# Patient Record
Sex: Female | Born: 1985 | Race: Black or African American | Hispanic: No | Marital: Single | Smoking: Current every day smoker
Health system: Southern US, Community
[De-identification: ages and names within clinical notes are randomized; demographics above are authoritative.]

## PROBLEM LIST (undated history)

## (undated) ENCOUNTER — Inpatient Hospital Stay (HOSPITAL_COMMUNITY): Payer: Self-pay

## (undated) DIAGNOSIS — A599 Trichomoniasis, unspecified: Secondary | ICD-10-CM

## (undated) DIAGNOSIS — R87629 Unspecified abnormal cytological findings in specimens from vagina: Secondary | ICD-10-CM

## (undated) DIAGNOSIS — A749 Chlamydial infection, unspecified: Secondary | ICD-10-CM

## (undated) DIAGNOSIS — N390 Urinary tract infection, site not specified: Secondary | ICD-10-CM

## (undated) DIAGNOSIS — O159 Eclampsia, unspecified as to time period: Secondary | ICD-10-CM

## (undated) DIAGNOSIS — A549 Gonococcal infection, unspecified: Secondary | ICD-10-CM

## (undated) DIAGNOSIS — IMO0002 Reserved for concepts with insufficient information to code with codable children: Secondary | ICD-10-CM

## (undated) DIAGNOSIS — R87619 Unspecified abnormal cytological findings in specimens from cervix uteri: Secondary | ICD-10-CM

## (undated) HISTORY — PX: COLPOSCOPY: SHX161

## (undated) HISTORY — PX: WISDOM TOOTH EXTRACTION: SHX21

---
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
## 2005-01-07 ENCOUNTER — Ambulatory Visit (HOSPITAL_COMMUNITY): Admission: RE | Admit: 2005-01-07 | Discharge: 2005-01-07 | Payer: Self-pay | Admitting: Obstetrics & Gynecology

## 2005-10-12 ENCOUNTER — Emergency Department (HOSPITAL_COMMUNITY): Admission: EM | Admit: 2005-10-12 | Discharge: 2005-10-12 | Payer: Self-pay | Admitting: *Deleted

## 2006-10-06 ENCOUNTER — Inpatient Hospital Stay (HOSPITAL_COMMUNITY): Admission: AD | Admit: 2006-10-06 | Discharge: 2006-10-06 | Payer: Self-pay | Admitting: Obstetrics

## 2006-10-07 ENCOUNTER — Inpatient Hospital Stay (HOSPITAL_COMMUNITY): Admission: AD | Admit: 2006-10-07 | Discharge: 2006-10-07 | Payer: Self-pay | Admitting: Obstetrics

## 2006-10-09 ENCOUNTER — Inpatient Hospital Stay (HOSPITAL_COMMUNITY): Admission: AD | Admit: 2006-10-09 | Discharge: 2006-10-10 | Payer: Self-pay | Admitting: Obstetrics

## 2006-10-09 ENCOUNTER — Encounter (INDEPENDENT_AMBULATORY_CARE_PROVIDER_SITE_OTHER): Payer: Self-pay | Admitting: Obstetrics

## 2007-06-24 ENCOUNTER — Emergency Department (HOSPITAL_COMMUNITY): Admission: EM | Admit: 2007-06-24 | Discharge: 2007-06-25 | Payer: Self-pay | Admitting: Emergency Medicine

## 2007-07-20 ENCOUNTER — Emergency Department (HOSPITAL_COMMUNITY): Admission: EM | Admit: 2007-07-20 | Discharge: 2007-07-21 | Payer: Self-pay | Admitting: Emergency Medicine

## 2007-07-21 ENCOUNTER — Emergency Department (HOSPITAL_COMMUNITY): Admission: EM | Admit: 2007-07-21 | Discharge: 2007-07-21 | Payer: Self-pay | Admitting: Emergency Medicine

## 2007-08-03 ENCOUNTER — Emergency Department (HOSPITAL_COMMUNITY): Admission: EM | Admit: 2007-08-03 | Discharge: 2007-08-04 | Payer: Self-pay | Admitting: Emergency Medicine

## 2008-09-29 ENCOUNTER — Emergency Department (HOSPITAL_COMMUNITY): Admission: EM | Admit: 2008-09-29 | Discharge: 2008-09-29 | Payer: Self-pay | Admitting: Emergency Medicine

## 2009-04-24 ENCOUNTER — Emergency Department (HOSPITAL_COMMUNITY): Admission: EM | Admit: 2009-04-24 | Discharge: 2009-04-24 | Payer: Self-pay | Admitting: Emergency Medicine

## 2009-11-10 ENCOUNTER — Emergency Department (HOSPITAL_COMMUNITY): Admission: EM | Admit: 2009-11-10 | Discharge: 2009-11-10 | Payer: Self-pay | Admitting: Emergency Medicine

## 2010-03-08 ENCOUNTER — Emergency Department (HOSPITAL_COMMUNITY)
Admission: EM | Admit: 2010-03-08 | Discharge: 2010-03-08 | Payer: Self-pay | Source: Home / Self Care | Admitting: Emergency Medicine

## 2010-04-03 ENCOUNTER — Encounter: Payer: Self-pay | Admitting: Obstetrics & Gynecology

## 2010-04-04 ENCOUNTER — Encounter: Payer: Self-pay | Admitting: Obstetrics & Gynecology

## 2010-05-24 LAB — URINALYSIS, ROUTINE W REFLEX MICROSCOPIC
Bilirubin Urine: NEGATIVE
Ketones, ur: NEGATIVE mg/dL
Nitrite: NEGATIVE
Protein, ur: NEGATIVE mg/dL
Urobilinogen, UA: 0.2 mg/dL (ref 0.0–1.0)

## 2010-05-24 LAB — WET PREP, GENITAL: Trich, Wet Prep: NONE SEEN

## 2010-05-24 LAB — POCT PREGNANCY, URINE: Preg Test, Ur: NEGATIVE

## 2010-05-24 LAB — GC/CHLAMYDIA PROBE AMP, GENITAL: GC Probe Amp, Genital: NEGATIVE

## 2010-05-27 LAB — URINALYSIS, ROUTINE W REFLEX MICROSCOPIC
Bilirubin Urine: NEGATIVE
Glucose, UA: NEGATIVE mg/dL
Hgb urine dipstick: NEGATIVE
Ketones, ur: NEGATIVE mg/dL
Nitrite: NEGATIVE
Protein, ur: NEGATIVE mg/dL
Specific Gravity, Urine: 1.023 (ref 1.005–1.030)
Urobilinogen, UA: 1 mg/dL (ref 0.0–1.0)
pH: 7.5 (ref 5.0–8.0)

## 2010-06-20 LAB — URINALYSIS, ROUTINE W REFLEX MICROSCOPIC
Bilirubin Urine: NEGATIVE
Hgb urine dipstick: NEGATIVE
Ketones, ur: NEGATIVE mg/dL
Protein, ur: NEGATIVE mg/dL
Specific Gravity, Urine: 1.024 (ref 1.005–1.030)
pH: 6 (ref 5.0–8.0)

## 2010-06-20 LAB — RPR: RPR Ser Ql: NONREACTIVE

## 2010-06-20 LAB — URINE MICROSCOPIC-ADD ON

## 2010-06-20 LAB — CBC
Hemoglobin: 12.5 g/dL (ref 12.0–15.0)
MCHC: 33 g/dL (ref 30.0–36.0)
MCV: 95.3 fL (ref 78.0–100.0)
RBC: 3.98 MIL/uL (ref 3.87–5.11)

## 2010-06-20 LAB — WET PREP, GENITAL

## 2010-06-20 LAB — GC/CHLAMYDIA PROBE AMP, GENITAL: Chlamydia, DNA Probe: POSITIVE — AB

## 2010-06-22 ENCOUNTER — Emergency Department (HOSPITAL_COMMUNITY)
Admission: EM | Admit: 2010-06-22 | Discharge: 2010-06-22 | Disposition: A | Discharge status: Home / Self Care | Payer: No Typology Code available for payment source | Attending: Emergency Medicine | Admitting: Emergency Medicine

## 2010-06-22 ENCOUNTER — Emergency Department (HOSPITAL_COMMUNITY): Payer: No Typology Code available for payment source

## 2010-06-22 DIAGNOSIS — R51 Headache: Secondary | ICD-10-CM | POA: Insufficient documentation

## 2010-06-22 DIAGNOSIS — M542 Cervicalgia: Secondary | ICD-10-CM | POA: Insufficient documentation

## 2010-07-30 NOTE — Discharge Summary (Signed)
Alicia Castro, Alicia Castro          ACCOUNT NO.:  [id number]   MEDICAL RECORD NO.:  [id number]          PATIENT TYPE:  INP   LOCATION:  9312                          FACILITY:  [facility]   PHYSICIAN:  Kathreen Cosier, M.D.DATE OF BIRTH:  [birth year]   DATE OF ADMISSION:  10/09/2006  DATE OF DISCHARGE:  10/10/2006                               DISCHARGE SUMMARY   The patient into 25 year old gravida 4, para 1-0-2-1. Her EDC by  ultrasound was December 09, 2006 and by last menstrual period December 08, 2006. The patient was admitted on October 09, 2006 with a fetal demise.  She states that the Friday and Saturday prior to her at admission she  came to the hospital with premature contractions.  No history of leakage  of fluid. She was sent home both times after hydration with no  medication.  The patient stated that the fetal movement was normal until  the night prior to admission. She was admitted on July 28, 11:25 a.m.  There was no fetal heart, cervix was 1 cm, 90% and the vertex was at -1  station.   HOSPITAL COURSE:  Membranes were ruptured artificially after ultrasound  confirmed fetal demise at 30 weeks. She progressed in labor on her own  and had a normal vaginal delivery of a nonviable female. Placenta was  sent to pathology.  Initially the patient did not want an autopsy but  subsequently agreed to one.   LABORATORY DATA:  Her prenatal labs were normal.  Her hemoglobin on her  first visit was 12.5, platelets 194.  She was O+, Rh negative, sickle  cell negative, RPR nonreactive, rubella positive, hepatitis negative,  HIV negative, PPD negative. GC and Chlamydia negative.  Diabetic screen;  glucose was 73. Pap Smear negative. On admission to the hospital  hemoglobin 10.4, white count 8.4, postdelivery 10.5 and 10.9.  PT/PTT  normal.  RPR negative.  She was discharged home on the first postpartum  day.   DISCHARGE DIAGNOSIS:  Intrauterine pregnancy at 30 weeks with fetal   demise.           ______________________________  Kathreen Cosier, M.D.     BAM/MEDQ  D:  11/01/2006  T:  11/01/2006  Job:  [id number]

## 2010-12-07 LAB — URINE MICROSCOPIC-ADD ON

## 2010-12-07 LAB — URINALYSIS, ROUTINE W REFLEX MICROSCOPIC
Ketones, ur: NEGATIVE
Specific Gravity, Urine: 1.018
Urobilinogen, UA: 0.2
pH: 6

## 2010-12-08 LAB — DIFFERENTIAL
Eosinophils Absolute: 0.1
Eosinophils Relative: 3
Lymphocytes Relative: 48 — ABNORMAL HIGH
Lymphs Abs: 1.7
Monocytes Absolute: 0.6
Monocytes Relative: 16 — ABNORMAL HIGH

## 2010-12-08 LAB — CBC
MCHC: 33.6
MCV: 92.3
Platelets: 173
RBC: 4.47
WBC: 3.7 — ABNORMAL LOW

## 2010-12-08 LAB — URINALYSIS, ROUTINE W REFLEX MICROSCOPIC
Hgb urine dipstick: NEGATIVE
Nitrite: NEGATIVE
Protein, ur: NEGATIVE
Specific Gravity, Urine: 1.033 — ABNORMAL HIGH
Urobilinogen, UA: 4 — ABNORMAL HIGH

## 2010-12-08 LAB — COMPREHENSIVE METABOLIC PANEL
ALT: 18
AST: 17
Albumin: 3.9
CO2: 26
Calcium: 9.3
Creatinine, Ser: 0.87
GFR calc Af Amer: >60
GFR calc non Af Amer: >60
Sodium: 139

## 2010-12-08 LAB — PREGNANCY, URINE: Preg Test, Ur: NEGATIVE

## 2010-12-27 LAB — URINALYSIS, ROUTINE W REFLEX MICROSCOPIC
Glucose, UA: NEGATIVE
Hgb urine dipstick: NEGATIVE
Ketones, ur: NEGATIVE
Nitrite: NEGATIVE
Specific Gravity, Urine: 1.025
pH: 6

## 2010-12-27 LAB — URINE MICROSCOPIC-ADD ON

## 2010-12-27 LAB — APTT: aPTT: 35

## 2010-12-27 LAB — CBC
HCT: 30.8 — ABNORMAL LOW
MCHC: 33.9
MCHC: 34
MCV: 95.4
MCV: 95.4
Platelets: 184
RBC: 3.21 — ABNORMAL LOW
RBC: 3.23 — ABNORMAL LOW
RDW: 13.6
WBC: 10.9 — ABNORMAL HIGH

## 2010-12-27 LAB — WET PREP, GENITAL
Clue Cells Wet Prep HPF POC: NONE SEEN
Trich, Wet Prep: NONE SEEN

## 2011-03-09 ENCOUNTER — Encounter: Payer: Self-pay | Admitting: *Deleted

## 2011-03-09 ENCOUNTER — Emergency Department (HOSPITAL_COMMUNITY)
Admission: EM | Admit: 2011-03-09 | Discharge: 2011-03-09 | Disposition: A | Discharge status: Home / Self Care | Payer: Self-pay | Attending: Emergency Medicine | Admitting: Emergency Medicine

## 2011-03-09 DIAGNOSIS — F17200 Nicotine dependence, unspecified, uncomplicated: Secondary | ICD-10-CM | POA: Insufficient documentation

## 2011-03-09 DIAGNOSIS — M549 Dorsalgia, unspecified: Secondary | ICD-10-CM | POA: Insufficient documentation

## 2011-03-09 DIAGNOSIS — N390 Urinary tract infection, site not specified: Secondary | ICD-10-CM | POA: Insufficient documentation

## 2011-03-09 DIAGNOSIS — R10819 Abdominal tenderness, unspecified site: Secondary | ICD-10-CM | POA: Insufficient documentation

## 2011-03-09 DIAGNOSIS — R300 Dysuria: Secondary | ICD-10-CM | POA: Insufficient documentation

## 2011-03-09 LAB — URINALYSIS, ROUTINE W REFLEX MICROSCOPIC
Bilirubin Urine: NEGATIVE
Glucose, UA: NEGATIVE mg/dL
Protein, ur: NEGATIVE mg/dL
pH: 6 (ref 5.0–8.0)

## 2011-03-09 LAB — URINE MICROSCOPIC-ADD ON

## 2011-03-09 MED ORDER — PHENAZOPYRIDINE HCL 95 MG PO TABS: 95.0000 mg | ORAL_TABLET | Freq: Three times a day (TID) | ORAL | Status: AC | PRN

## 2011-03-09 MED ORDER — CIPROFLOXACIN HCL 500 MG PO TABS: 500.0000 mg | ORAL_TABLET | Freq: Two times a day (BID) | ORAL | Status: AC

## 2011-03-09 MED ORDER — HYDROCODONE-ACETAMINOPHEN 5-500 MG PO TABS: 1.0000 | ORAL_TABLET | Freq: Four times a day (QID) | ORAL | Status: DC | PRN

## 2011-03-09 MED ORDER — HYDROCODONE-ACETAMINOPHEN 5-500 MG PO TABS: 1.0000 | ORAL_TABLET | Freq: Four times a day (QID) | ORAL | Status: AC | PRN

## 2011-03-09 NOTE — ED Provider Notes (Signed)
History     CSN: [id number]  Arrival date & time 03/09/11  0721   First MD Initiated Contact with Patient 03/09/11 0747      Chief Complaint  Patient presents with  . Dysuria  . Back Pain    (Consider location/radiation/quality/duration/timing/severity/associated sxs/prior treatment) Patient is a 25 y.o. female presenting with dysuria and back pain. The history is provided by the patient.  Dysuria   Back Pain  Associated symptoms include dysuria.    Pt presents to the ED with complaints of lower back pain and pain during urination for 24 hours. Pt is also started her cycle yesterday. Pt denies fevers, nausea, vomiting, diarrhea, chills, abdominal pain. She does not have a significant history for UTIs and is not pregnant currently.  History reviewed. No pertinent past medical history.  History reviewed. No pertinent past surgical history.  No family history on file.  History  Substance Use Topics  . Smoking status: Current Everyday Smoker -- 0.5 packs/day  . Smokeless tobacco: Not on file  . Alcohol Use: Yes     ocassionally    OB History    Grav Para Term Preterm Abortions TAB SAB Ect Mult Living                  Review of Systems  Genitourinary: Positive for dysuria.  Musculoskeletal: Positive for back pain.  All other systems reviewed and are negative.    Allergies  Review of patient's allergies indicates no known allergies.  Home Medications   Current Outpatient Rx  Name Route Sig Dispense Refill  . CIPROFLOXACIN HCL 500 MG PO TABS Oral Take 1 tablet (500 mg total) by mouth 2 (two) times daily. 28 tablet 0  . HYDROCODONE-ACETAMINOPHEN 5-500 MG PO TABS Oral Take 1 tablet by mouth every 6 (six) hours as needed for pain. 30 tablet 0  . PHENAZOPYRIDINE HCL 95 MG PO TABS Oral Take 1 tablet (95 mg total) by mouth 3 (three) times daily as needed for pain. 10 tablet 0    BP 118/63  Pulse 77  Temp(Src) 97.6 F (36.4 C) (Oral)  Resp 16  Wt 220 lb (99.791  kg)  SpO2 100%  LMP 01/13/2011  Physical Exam  Nursing note and vitals reviewed. Constitutional: She appears well-developed and well-nourished.  HENT:  Head: Normocephalic and atraumatic.  Eyes: Conjunctivae are normal. Pupils are equal, round, and reactive to light.  Neck: Trachea normal, normal range of motion and full passive range of motion without pain. Neck supple.  Cardiovascular: Normal rate, regular rhythm and normal pulses.   Pulmonary/Chest: Effort normal. Chest wall is not dull to percussion. She exhibits no tenderness, no crepitus, no edema, no deformity and no retraction.  Abdominal: Soft. Normal appearance and bowel sounds are normal. She exhibits no distension and no mass. There is Tenderness: mild suprapubic tenderness.. There is no rebound and no guarding.  Musculoskeletal: Normal range of motion.  Neurological: She is alert. She has normal strength.  Skin: Skin is warm, dry and intact.  Psychiatric: Her speech is normal. Cognition and memory are normal.    ED Course  Procedures (including critical care time)  Labs Reviewed  URINALYSIS, ROUTINE W REFLEX MICROSCOPIC - Abnormal; Notable for the following:    Color, Urine AMBER (*) BIOCHEMICALS MAY BE AFFECTED BY COLOR   APPearance CLOUDY (*)    Hgb urine dipstick LARGE (*)    Ketones, ur TRACE (*)    Leukocytes, UA SMALL (*)    All other components  within normal limits  URINE MICROSCOPIC-ADD ON - Abnormal; Notable for the following:    Squamous Epithelial / LPF FEW (*)    Bacteria, UA MANY (*)    All other components within normal limits  PREGNANCY, URINE   No results found.   1. UTI (urinary tract infection)       MDM  Discussed treatment plan with patient and is is understanding. Will follow-up with PCP.       Dorthula Matas, PA 03/09/11 [phone]

## 2011-03-09 NOTE — ED Notes (Signed)
Pt now stating "I am having my period, it's like a regular cycle"

## 2011-03-09 NOTE — ED Notes (Signed)
Pt states "Christmas morning, my lower back started hurting, I thought I might be getting a yeast infection, but then I noticed blood and thought it was my period, my back started hurting worse, too much pressure"

## 2011-03-10 NOTE — ED Provider Notes (Signed)
Medical screening examination/treatment/procedure(s) were performed by non-physician practitioner and as supervising physician I was immediately available for consultation/collaboration.   Suzi Roots, MD 03/10/11 [phone]

## 2011-06-05 ENCOUNTER — Emergency Department (HOSPITAL_COMMUNITY): Payer: No Typology Code available for payment source

## 2011-06-05 ENCOUNTER — Encounter (HOSPITAL_COMMUNITY): Payer: Self-pay | Admitting: *Deleted

## 2011-06-05 ENCOUNTER — Emergency Department (HOSPITAL_COMMUNITY)
Admission: EM | Admit: 2011-06-05 | Discharge: 2011-06-05 | Disposition: A | Discharge status: Home / Self Care | Payer: No Typology Code available for payment source | Attending: Emergency Medicine | Admitting: Emergency Medicine

## 2011-06-05 DIAGNOSIS — Y92410 Unspecified street and highway as the place of occurrence of the external cause: Secondary | ICD-10-CM | POA: Insufficient documentation

## 2011-06-05 DIAGNOSIS — M545 Low back pain, unspecified: Secondary | ICD-10-CM | POA: Insufficient documentation

## 2011-06-05 DIAGNOSIS — S20219A Contusion of unspecified front wall of thorax, initial encounter: Secondary | ICD-10-CM | POA: Insufficient documentation

## 2011-06-05 DIAGNOSIS — F17200 Nicotine dependence, unspecified, uncomplicated: Secondary | ICD-10-CM | POA: Insufficient documentation

## 2011-06-05 MED ORDER — IBUPROFEN 800 MG PO TABS
800.0000 mg | ORAL_TABLET | Freq: Once | ORAL | Status: AC
  Administered 2011-06-05: 800 mg via ORAL
  Filled 2011-06-05: qty 1

## 2011-06-05 MED ORDER — HYDROCODONE-ACETAMINOPHEN 5-500 MG PO TABS: 1.0000 | ORAL_TABLET | Freq: Four times a day (QID) | ORAL | Status: AC | PRN

## 2011-06-05 MED ORDER — IBUPROFEN 600 MG PO TABS: 600.0000 mg | ORAL_TABLET | Freq: Four times a day (QID) | ORAL | Status: AC | PRN
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
## 2011-06-05 NOTE — ED Provider Notes (Signed)
Medical screening examination/treatment/procedure(s) were performed by non-physician practitioner and as supervising physician I was immediately available for consultation/collaboration.   Charlesia Canaday, MD 06/05/11 0649 

## 2011-07-04 ENCOUNTER — Emergency Department (HOSPITAL_COMMUNITY)
Admission: EM | Admit: 2011-07-04 | Discharge: 2011-07-05 | Disposition: A | Discharge status: Home / Self Care | Payer: Medicaid Other | Attending: Emergency Medicine | Admitting: Emergency Medicine

## 2011-07-04 ENCOUNTER — Encounter (HOSPITAL_COMMUNITY): Payer: Self-pay

## 2011-07-04 DIAGNOSIS — L03211 Cellulitis of face: Secondary | ICD-10-CM | POA: Insufficient documentation

## 2011-07-04 DIAGNOSIS — L0201 Cutaneous abscess of face: Secondary | ICD-10-CM | POA: Insufficient documentation

## 2011-07-04 DIAGNOSIS — L988 Other specified disorders of the skin and subcutaneous tissue: Secondary | ICD-10-CM | POA: Insufficient documentation

## 2011-07-04 DIAGNOSIS — N390 Urinary tract infection, site not specified: Secondary | ICD-10-CM | POA: Insufficient documentation

## 2011-07-04 DIAGNOSIS — F17200 Nicotine dependence, unspecified, uncomplicated: Secondary | ICD-10-CM | POA: Insufficient documentation

## 2011-07-04 LAB — URINALYSIS, ROUTINE W REFLEX MICROSCOPIC
Ketones, ur: NEGATIVE mg/dL
Nitrite: POSITIVE — AB
Protein, ur: 30 mg/dL — AB
Urobilinogen, UA: 1 mg/dL (ref 0.0–1.0)

## 2011-07-04 LAB — URINE MICROSCOPIC-ADD ON

## 2011-07-04 MED ORDER — SULFAMETHOXAZOLE-TRIMETHOPRIM 800-160 MG PO TABS: 1.0000 | ORAL_TABLET | Freq: Two times a day (BID) | ORAL | Status: AC

## 2011-07-04 MED ORDER — CEFTRIAXONE SODIUM 1 G IJ SOLR
1.0000 g | INTRAMUSCULAR | Status: DC
  Administered 2011-07-05: 1 g via INTRAMUSCULAR
  Filled 2011-07-04: qty 10

## 2011-07-04 NOTE — Discharge Instructions (Signed)
You were found to have a urinary tract infection today. Even given a prescription for an antibiotic to treat this infection as well as the infection is on your skin. Please take this as instructed for the full length of time. It is recommended that you have a recheck of your symptoms in the next 24-48 hours to be sure your skin infection is improving. Use warm cloth compresses over the areas 4-5 times a day for 20-30 minutes to help increase blood flow. Return sooner to the emergency room if you develop any increased swelling, fever, chills, sweats or persistent nausea vomiting.   Abscess An abscess (boil or furuncle) is an infected area that contains a collection of pus.  SYMPTOMS Signs and symptoms of an abscess include pain, tenderness, redness, or hardness. You may feel a moveable soft area under your skin. An abscess can occur anywhere in the body.  TREATMENT  A surgical cut (incision) may be made over your abscess to drain the pus. Gauze may be packed into the space or a drain may be looped through the abscess cavity (pocket). This provides a drain that will allow the cavity to heal from the inside outwards. The abscess may be painful for a few days, but should feel much better if it was drained.  Your abscess, if seen early, may not have localized and may not have been drained. If not, another appointment may be required if it does not get better on its own or with medications. HOME CARE INSTRUCTIONS   Only take over-the-counter or prescription medicines for pain, discomfort, or fever as directed by your caregiver.   Take your antibiotics as directed if they were prescribed. Finish them even if you start to feel better.   Keep the skin and clothes clean around your abscess.   If the abscess was drained, you will need to use gauze dressing to collect any draining pus. Dressings will typically need to be changed 3 or more times a day.   The infection may spread by skin contact with others.  Avoid skin contact as much as possible.   Practice good hygiene. This includes regular hand washing, cover any draining skin lesions, and do not share personal care items.   If you participate in sports, do not share athletic equipment, towels, whirlpools, or personal care items. Shower after every practice or tournament.   If a draining area cannot be adequately covered:   Do not participate in sports.   Children should not participate in day care until the wound has healed or drainage stops.   If your caregiver has given you a follow-up appointment, it is very important to keep that appointment. Not keeping the appointment could result in a much worse infection, chronic or permanent injury, pain, and disability. If there is any problem keeping the appointment, you must call back to this facility for assistance.  SEEK MEDICAL CARE IF:   You develop increased pain, swelling, redness, drainage, or bleeding in the wound site.   You develop signs of generalized infection including muscle aches, chills, fever, or a general ill feeling.   You have an oral temperature above 102 F (38.9 C).  MAKE SURE YOU:   Understand these instructions.   Will watch your condition.   Will get help right away if you are not doing well or get worse.  Document Released: 12/08/2004 Document Revised: 02/17/2011 Document Reviewed: 10/02/2007 [facility] Patient Information 2012 [place], [place].     Urinary Tract Infection Infections of the  urinary tract can start in several places. A bladder infection (cystitis), a kidney infection (pyelonephritis), and a prostate infection (prostatitis) are different types of urinary tract infections (UTIs). They usually get better if treated with medicines (antibiotics) that kill germs. Take all the medicine until it is gone. You or your child may feel better in a few days, but TAKE ALL MEDICINE or the infection may not respond and may become more difficult to treat. HOME  CARE INSTRUCTIONS   Drink enough water and fluids to keep the urine clear or pale yellow. Cranberry juice is especially recommended, in addition to large amounts of water.   Avoid caffeine, tea, and carbonated beverages. They tend to irritate the bladder.   Alcohol may irritate the prostate.   Only take over-the-counter or prescription medicines for pain, discomfort, or fever as directed by your caregiver.  To prevent further infections:  Empty the bladder often. Avoid holding urine for long periods of time.   After a bowel movement, women should cleanse from front to back. Use each tissue only once.   Empty the bladder before and after sexual intercourse.  FINDING OUT THE RESULTS OF YOUR TEST Not all test results are available during your visit. If your or your child's test results are not back during the visit, make an appointment with your caregiver to find out the results. Do not assume everything is normal if you have not heard from your caregiver or the medical facility. It is important for you to follow up on all test results. SEEK MEDICAL CARE IF:   There is back pain.   Your baby is older than 3 months with a rectal temperature of 100.5 F (38.1 C) or higher for more than 1 day.   Your or your child's problems (symptoms) are no better in 3 days. Return sooner if you or your child is getting worse.  SEEK IMMEDIATE MEDICAL CARE IF:   There is severe back pain or lower abdominal pain.   You or your child develops chills.   You have a fever.   Your baby is older than 3 months with a rectal temperature of 102 F (38.9 C) or higher.   Your baby is 53 months old or younger with a rectal temperature of 100.4 F (38 C) or higher.   There is nausea or vomiting.   There is continued burning or discomfort with urination.  MAKE SURE YOU:   Understand these instructions.   Will watch your condition.   Will get help right away if you are not doing well or get worse.    Document Released: 12/08/2004 Document Revised: 02/17/2011 Document Reviewed: 07/13/2006 [facility] Patient Information 2012 [place], [place].    RESOURCE GUIDE  Dental Problems  Patients with Medicaid: [facility] [phone] [address]                                           [phone] [address] Phone:  [phone]                                                  Phone:  [phone]  If unable to pay or uninsured, contact:  [facility] or [facility]. to become qualified for the adult dental clinic.  Chronic Pain Problems Contact [facility]  [phone] Patients need to be referred by their primary care doctor.  Insufficient Money for Medicine Contact [employer]:  call "211" or [facility] 657-756-3862.  No Primary Care Doctor Call [facility]  [phone] Other agencies that provide inexpensive medical care    [facility]  [phone]    [facility]  [phone]    [facility]  [phone]    [facility]  [phone]    [facility]  [phone]    [facility]  [phone]  Psychological Services [facility] Behavior[facility]  [phone] [facility] Services  [phone] [facility] Mental Health   [phone] (emergency services [phone])  Substance Abuse Resources Alcohol and Drug Services  [phone] [facility] 915-231-3773 [facility] [phone] Floydene Flock 6848505868 Residential & Outpatient Substance Abuse Program  [phone]  Abuse/Neglect [facility] Child Abuse Hotline 8673338588 [facility] Child Abuse Hotline [phone] (After Hours)  Emergency Shelter [facility] [phone]  Maternity Homes [facility] 5393005135 [facility] [phone]  MRSA Hotline #:   6803393464    [facility] Resources  [facility]  [place]     [employer]                          [facility] Dept. [address] [place]                       [address]      [address]  Blondell Reveal Phone:  [phone]                                   Phone:  [phone]                 Phone:  [phone]  [facility] Mental [facility] Phone:  [phone]  [facility] Child Abuse Hotline 706-761-0967 [phone] (After Hours)

## 2011-07-04 NOTE — ED Provider Notes (Signed)
History     CSN: [id number]  Arrival date & time 07/04/11  1941   First MD Initiated Contact with Patient 07/04/11 2211      Chief Complaint  Patient presents with  . Recurrent Skin Infections  . Urinary Frequency    HPI  History provided by the patient. Patient is a 26 year old female with no significant past medical history who presents with complaints of soreness and possible skin infection to right cheek and face. Patient states that she felt that she had a small pimple to the right cheek over the past several days. There is a small amount of pointing and patient tried to pop the pimple with expression of slight pus and blood. Since that time she has continued to have some soreness and firmness to the skin. Patient also reports developing 2 separate similar red areas to her right lower leg. She denies any bleeding or drainage from these areas. Patient has not taken anything or done anything to help with her symptoms. Tenderness is worse with palpation. Patient denies any dental pain. Patient denies any fever, chills, sweats. Patient denies any swelling around either eye pain. Symptoms are described as moderate. Patient also has secondary concerns for possible UTI. She reports several days of urinary frequency. She denies dysuria or hematuria. Patient denies any vaginal bleeding or vaginal discharge. Patient denies any abdominal pain, flank pain or nausea vomiting.      History reviewed. No pertinent past medical history.  History reviewed. No pertinent past surgical history.  History reviewed. No pertinent family history.  History  Substance Use Topics  . Smoking status: Current Everyday Smoker -- 0.5 packs/day  . Smokeless tobacco: Not on file  . Alcohol Use: Yes     ocassionally    OB History    Grav Para Term Preterm Abortions TAB SAB Ect Mult Living                  Review of Systems  Constitutional: Negative for fever and chills.  Eyes: Negative for pain and  visual disturbance.  Gastrointestinal: Negative for abdominal pain.  Genitourinary: Positive for frequency. Negative for dysuria, hematuria, flank pain, vaginal bleeding and vaginal discharge.  Musculoskeletal: Negative for myalgias and back pain.  Skin: Positive for rash.    Allergies  Review of patient's allergies indicates no known allergies.  Home Medications   Current Outpatient Rx  Name Route Sig Dispense Refill  . IBUPROFEN 200 MG PO TABS Oral Take 200 mg by mouth every 6 (six) hours as needed.      BP 116/72  Pulse 80  Temp(Src) 98.7 F (37.1 C) (Oral)  Resp 18  Wt 200 lb (90.719 kg)  SpO2 100%  LMP 06/15/2011  Physical Exam  Nursing note and vitals reviewed. Constitutional: She is oriented to person, place, and time. She appears well-developed and well-nourished. No distress.  HENT:  Head: Normocephalic and atraumatic.       8 mm erythematous macule right cheek with induration. Small pinpoint scab without drainage or bleeding. Very minimal swelling extending slightly to inferior periorbital area.  Cardiovascular: Normal rate and regular rhythm.   Pulmonary/Chest: Effort normal and breath sounds normal. No respiratory distress. She has no wheezes. She has no rales.  Abdominal: Soft. She exhibits no distension. There is no tenderness. There is no rebound, no guarding and no CVA tenderness.  Neurological: She is alert and oriented to person, place, and time.  Skin: Skin is warm and dry.  2 independent 1-2 cm erythematous macules with slight induration on right anterior medial lower leg and right distal medial thigh. No erythematous streaks. No pointing or fluctuance.  Psychiatric: She has a normal mood and affect. Her behavior is normal.    ED Course  Procedures    Results for orders placed during the hospital encounter of 07/04/11  URINALYSIS, ROUTINE W REFLEX MICROSCOPIC      Component Value Range   Color, Urine YELLOW  YELLOW    APPearance CLOUDY (*)  CLEAR    Specific Gravity, Urine 1.026  1.005 - 1.030    pH 7.0  5.0 - 8.0    Glucose, UA NEGATIVE  NEGATIVE (mg/dL)   Hgb urine dipstick TRACE (*) NEGATIVE    Bilirubin Urine NEGATIVE  NEGATIVE    Ketones, ur NEGATIVE  NEGATIVE (mg/dL)   Protein, ur 30 (*) NEGATIVE (mg/dL)   Urobilinogen, UA 1.0  0.0 - 1.0 (mg/dL)   Nitrite POSITIVE (*) NEGATIVE    Leukocytes, UA LARGE (*) NEGATIVE   POCT PREGNANCY, URINE      Component Value Range   Preg Test, Ur NEGATIVE  NEGATIVE   URINE MICROSCOPIC-ADD ON      Component Value Range   Squamous Epithelial / LPF RARE  RARE    WBC, UA TOO NUMEROUS TO COUNT  <3 (WBC/hpf)   RBC / HPF 0-2  <3 (RBC/hpf)   Bacteria, UA MANY (*) RARE    Urine-Other MUCOUS PRESENT         1. Abscess of face   2. UTI (lower urinary tract infection)       MDM  10:10 PM patient seen and evaluated. Patient in no acute distress.        Angus Seller, [place] 07/05/11 [phone]

## 2011-07-04 NOTE — ED Notes (Signed)
Attempted to assess patient but she was on her cellphone, continued to talk while i was present. Will reattempt assessment when pt is done with phone conversation

## 2011-07-04 NOTE — ED Notes (Signed)
Pt complains of an abcess on her face and two on her leg, she also complains of urinary frequency and wants to be tested for a UTI

## 2011-07-05 MED ORDER — LIDOCAINE HCL 1 % IJ SOLN
INTRAMUSCULAR | Status: AC
  Administered 2011-07-05: 3 mL
  Filled 2011-07-05: qty 20

## 2011-07-05 NOTE — ED Notes (Signed)
Pt DC'd by me per Charge Nurse orders

## 2011-07-05 NOTE — ED Provider Notes (Signed)
Medical screening examination/treatment/procedure(s) were performed by non-physician practitioner and as supervising physician I was immediately available for consultation/collaboration.  Hurman Horn, MD 07/05/11 [phone]

## 2011-08-21 ENCOUNTER — Encounter (HOSPITAL_COMMUNITY): Payer: Self-pay | Admitting: *Deleted

## 2011-08-21 ENCOUNTER — Emergency Department (HOSPITAL_COMMUNITY)
Admission: EM | Admit: 2011-08-21 | Discharge: 2011-08-21 | Disposition: A | Discharge status: Home / Self Care | Payer: Medicaid Other | Attending: Emergency Medicine | Admitting: Emergency Medicine

## 2011-08-21 DIAGNOSIS — R197 Diarrhea, unspecified: Secondary | ICD-10-CM | POA: Insufficient documentation

## 2011-08-21 DIAGNOSIS — R1110 Vomiting, unspecified: Secondary | ICD-10-CM | POA: Insufficient documentation

## 2011-08-21 DIAGNOSIS — N390 Urinary tract infection, site not specified: Secondary | ICD-10-CM

## 2011-08-21 DIAGNOSIS — R109 Unspecified abdominal pain: Secondary | ICD-10-CM | POA: Insufficient documentation

## 2011-08-21 LAB — URINALYSIS, ROUTINE W REFLEX MICROSCOPIC
Glucose, UA: NEGATIVE mg/dL
Protein, ur: 30 mg/dL — AB
Specific Gravity, Urine: 1.033 — ABNORMAL HIGH (ref 1.005–1.030)
pH: 6 (ref 5.0–8.0)

## 2011-08-21 LAB — URINE MICROSCOPIC-ADD ON

## 2011-08-21 LAB — POCT PREGNANCY, URINE: Preg Test, Ur: NEGATIVE

## 2011-08-21 MED ORDER — SODIUM CHLORIDE 0.9 % IV BOLUS (SEPSIS)
1000.0000 mL | Freq: Once | INTRAVENOUS | Status: AC
  Administered 2011-08-21: 1000 mL via INTRAVENOUS

## 2011-08-21 MED ORDER — HYDROCODONE-ACETAMINOPHEN 5-325 MG PO TABS: 1.0000 | ORAL_TABLET | ORAL | Status: AC | PRN

## 2011-08-21 MED ORDER — CIPROFLOXACIN HCL 500 MG PO TABS: 500.0000 mg | ORAL_TABLET | Freq: Two times a day (BID) | ORAL | Status: AC

## 2011-08-21 MED ORDER — MORPHINE SULFATE 4 MG/ML IJ SOLN
4.0000 mg | Freq: Once | INTRAMUSCULAR | Status: AC
  Administered 2011-08-21: 4 mg via INTRAVENOUS
  Filled 2011-08-21: qty 1

## 2011-08-21 MED ORDER — PROMETHAZINE HCL 25 MG RE SUPP: 25.0000 mg | Freq: Four times a day (QID) | RECTAL | Status: DC | PRN

## 2011-08-21 MED ORDER — DEXTROSE 5 % IV SOLN
1.0000 g | Freq: Once | INTRAVENOUS | Status: AC
  Administered 2011-08-21: 1 g via INTRAVENOUS
  Filled 2011-08-21: qty 10

## 2011-08-21 MED ORDER — PROMETHAZINE HCL 25 MG PO TABS: 25.0000 mg | ORAL_TABLET | Freq: Four times a day (QID) | ORAL | Status: DC | PRN

## 2011-08-21 MED ORDER — ONDANSETRON HCL 4 MG/2ML IJ SOLN
4.0000 mg | Freq: Once | INTRAMUSCULAR | Status: AC
  Administered 2011-08-21: 4 mg via INTRAVENOUS
  Filled 2011-08-21: qty 2

## 2011-08-21 NOTE — ED Provider Notes (Signed)
History     CSN: [id number]  Arrival date & time 08/21/11  4098   First MD Initiated Contact with Patient 08/21/11 [phone]      Chief Complaint  Patient presents with  . Emesis  . Abdominal Pain    (Consider location/radiation/quality/duration/timing/severity/associated sxs/prior treatment) HPI History from patient. 26 year old female who presents with abdominal and low back pain which began last evening around 5 PM. Pain is described as cramping in nature to the abdomen. Pain to the back is sharp in nature. Pain is constant and there are no known aggravating/alleviating factors. Soon after the pain started, she began to have vomiting and diarrhea. She denies dysuria, hematuria, but has noted some increased frequency of urination. She denies vaginal bleeding or discharge. Denies fever or chills. Patient does have a history of pyelonephritis and states that this feels somewhat similar, although not quite as severe. No treatment at home prior to arrival.  History reviewed. No pertinent past medical history.  History reviewed. No pertinent past surgical history.  No family history on file.  History  Substance Use Topics  . Smoking status: Current Everyday Smoker -- 0.5 packs/day  . Smokeless tobacco: Not on file  . Alcohol Use: Yes     ocassionally    OB History    Grav Para Term Preterm Abortions TAB SAB Ect Mult Living                  Review of Systems  Constitutional: Negative for fever, chills and appetite change.  Respiratory: Negative for cough and shortness of breath.   Cardiovascular: Negative for chest pain.  Gastrointestinal: Positive for nausea, vomiting, abdominal pain and diarrhea. Negative for constipation and rectal pain.  Genitourinary: Positive for frequency and flank pain. Negative for dysuria, vaginal bleeding, vaginal discharge, difficulty urinating and pelvic pain.  Musculoskeletal: Negative for myalgias.  Skin: Negative for rash.  Neurological: Negative  for dizziness and headaches.    Allergies  Review of patient's allergies indicates no known allergies.  Home Medications   Current Outpatient Rx  Name Route Sig Dispense Refill  . BC HEADACHE POWDER PO Oral Take 1 each by mouth daily as needed. For headache    . MIDOL PO Oral Take 1-2 tablets by mouth every 8 (eight) hours as needed. For pain      BP 100/68  Pulse 70  Temp(Src) 98.2 F (36.8 C) (Oral)  Resp 16  Ht 5\' 5"  (1.651 m)  Wt 220 lb (99.791 kg)  BMI 36.61 kg/m2  SpO2 100%  Physical Exam  Nursing note and vitals reviewed. Constitutional: She appears well-developed and well-nourished. No distress.  HENT:  Head: Normocephalic and atraumatic.  Right Ear: External ear normal.  Left Ear: External ear normal.  Mouth/Throat: Oropharynx is clear and moist.  Neck: Normal range of motion.  Cardiovascular: Normal rate, regular rhythm and normal heart sounds.   Pulmonary/Chest: Effort normal and breath sounds normal. She exhibits no tenderness.  Abdominal: Soft. Bowel sounds are normal. There is no tenderness. There is no rebound and no guarding.       +CVA tenderness L  Musculoskeletal: Normal range of motion.  Neurological: She is alert.  Skin: Skin is warm and dry. She is not diaphoretic.  Psychiatric: She has a normal mood and affect.    ED Course  Procedures (including critical care time)  Labs Reviewed  URINALYSIS, ROUTINE W REFLEX MICROSCOPIC - Abnormal; Notable for the following:    Color, Urine AMBER (*) BIOCHEMICALS MAY BE  AFFECTED BY COLOR   APPearance TURBID (*)    Specific Gravity, Urine 1.033 (*)    Bilirubin Urine SMALL (*)    Ketones, ur 15 (*)    Protein, ur 30 (*)    Nitrite POSITIVE (*)    Leukocytes, UA SMALL (*)    All other components within normal limits  URINE MICROSCOPIC-ADD ON - Abnormal; Notable for the following:    Squamous Epithelial / LPF FEW (*)    Bacteria, UA MANY (*)    All other components within normal limits  POCT  PREGNANCY, URINE   No results found.   1. UTI (urinary tract infection)       MDM  Pt with hx pyelonephritis presents with abd/back pain. Endorses some urinary frequency; denies vaginal dc or bleeding. Abd soft without tenderness. +CVA tenderness L. Pt was rehydrated here with 1L NS, given IV Rocephin, Zofran. Felt better with this, was able to tolerate PO challenge. Pt will be dced home with rx for Cipro, antiemetics. Instructed to return for worsening/changing pain, intractable vomiting, or other worrisome symptoms. Pt verbalized understanding, agreed to plan.        Grant Fontana, [place] 08/21/11 [phone]

## 2011-08-21 NOTE — ED Notes (Signed)
Patient tolerating PO Challenge well

## 2011-08-21 NOTE — Discharge Instructions (Signed)
You have been seen for abdominal pain today. Your urine sample indicates that you likely have a urinary tract infection, which may be an early kidney infection. Please take ALL of the antibiotic as prescribed. Use the pain medication as needed. You have been given prescriptions for Phenergan for nausea, both in pill and suppository form (use the suppositories if you can't keep the pills down). Return to the emergency department if your pain worsens or changes, you run a high fever, you are unable to keep down the antibiotic despite the nausea medication, or with any other concerns.  RESOURCE GUIDE  Chronic Pain Problems: Contact [facility]  [phone] Patients need to be referred by their primary care doctor.  Insufficient Money for Medicine: Contact United Way:  call "211" or [facility] [phone].  No Primary Care Doctor: - Call [facility]  [phone] - can help you locate a primary care doctor that  accepts your insurance, provides certain services, etc. - Physician Referral Service- [phone]  Agencies that provide inexpensive medical care: - [facility]  [phone] - [facility]  [phone] - [facility]  [phone] - Women's Clinic  [phone] - Planned Parenthood  [phone] [facility]  [phone]  Medicaid-accepting [facility] Providers: - [facility]- [address]  [phone], Mon-Fri 9am-7pm, Sat 9am-1pm - [facility]- [address] [place], [address] [place]  [phone] - [facility]- [address], [address] [place]  [phone] [facility]- [address]  [phone] - Renaye Rakers- [address] [place], [address], [phone]  Only accepts [place] patients after they have their name  applied to their card  Self Pay (no insurance) in [place]: - Sickle Cell Patients: Dr Willey Blade,  [facility]  [address] [place], [phone] - [facility]- [address] [place]  [phone]       [facility]- [address]       -     [facility]- see information above (Speak to [employer] if you do not have insurance)       -  [facility]- [address] [place], [phone]       -  [facility]- [address],  [phone]       -  [facility]- [address] [phone]       -  Dr Julio Sicks-  [address], [place], [phone]       -  [facility]- [address], [phone]       -  [facility]- [address] [phone], also [address] [phone]       -    [facility]- [address] [place], [phone], 1st & 3rd Saturday   every month, 10am-1pm  1) Find a Doctor and Pay Out of Pocket Although you won't have to find out who is covered by your insurance plan, it is a good idea to ask around and get recommendations. You will then need to call the office and see if the doctor you have chosen will accept you as a new patient and what types of options they offer for patients who are self-pay. Some doctors offer discounts or  will set up payment plans for their patients who do not have insurance, but you will need to ask so you aren't surprised when you get to your appointment.  2) Contact Your Local Health Department Not all health departments have doctors that can see patients for sick visits, but many do, so it is worth a call to see if yours does. If you don't know where your local health department is, you can check in your phone book. The CDC also has a tool to help you locate your state's health department, and many state websites also have listings of all of their local health departments.  3) Find a Walk-in Clinic If your illness is not likely to be very severe or complicated, you may want to try a walk in clinic. These are  popping up all over the country in pharmacies, drugstores, and shopping centers. They're usually staffed by nurse practitioners or physician assistants that have been trained to treat common illnesses and complaints. They're usually fairly quick and inexpensive. However, if you have serious medical issues or chronic medical problems, these are probably not your best option  STD Testing - [facility] of [facility], STD Clinic, [address], [place], phone [phone] or [phone].  Monday - Friday, call for an appointment. [facility] Department of [employer], STD Clinic, [address], [place], phone [phone] or [phone].  Monday - Friday, call for an appointment.  Abuse/Neglect: [facility] Child Abuse Hotline (778)047-8875 [facility] Child Abuse Hotline [phone] (After Hours)  Emergency Shelter:  [facility] [phone]  Maternity Homes: - [facility] [phone] - [facility] [phone]  MRSA Hotline #:   734-256-6066  [facility] Resources  [facility] [place]  United Way [facility] Dept. [address]                 [address]         [address] [place] [address]  Blondell Reveal Phone:  [phone]                                  Phone:  [phone]                   Phone:  [phone]  [facility], [phone] - [phone]       -     [facility] in [place], [address]                                  [phone], Insurance  [facility] Child Abuse Hotline 475-651-3520 or (480)812-0251 (After Hours)   Behavioral Health Services  Substance Abuse Resources: - Alcohol and Drug Services   (343) 478-6478 - Addiction  [facility] ([phone] - [facility] [place] [phone] Floydene Flock [phone] - Residential & Outpatient Substance Abuse Program  ([phone]  Psychological Services: [facility]  [phone] Services  [phone] - [facility], [phone] [place]. [address] [place], ACCESS LINE: 610-347-6843 or (808)[phone], [employer]  Dental Assistance  If unable to pay or uninsured, contact:  [facility] or [facility]. to become qualified for the adult dental clinic.  Patients with Medicaid: [facility] [phone] [address], [phone] [address], [phone]  If unable to pay, or uninsured, contact [facility] [phone]) or [facility] Department [phone] in [place], [phone] in [facility]) to become qualified for the adult dental clinic  Other Low-Cost Community Dental Services: - [facility]- [address] [place], [place], [place], [address], [phone], [phone], 2nd and 4th Thursday of the month at 6:30am.  10 clients each day by appointment, can sometimes see walk-in patients if someone does not show for an appointment. [facility]- [address] [place], [place], [address], [phone] - [facility]- [address], [place], [place], [address], [phone] [facility] Health Department- [phone] [facility] Health Department- [phone] [facility] [phone]  Urinary Tract Infection Infections of the urinary tract can start in several places. A bladder infection (cystitis), a kidney infection (pyelonephritis), and a prostate infection (prostatitis) are different types of urinary tract infections (UTIs). They usually get better if treated with medicines (antibiotics) that kill germs. Take all the medicine until it is gone. You or your child may feel better in a  few days, but TAKE ALL MEDICINE or the infection may not respond and may become more difficult to treat. HOME CARE INSTRUCTIONS   Drink enough water and fluids to keep the urine clear or pale yellow. Cranberry juice is especially recommended, in addition to large amounts of water.   Avoid caffeine, tea, and carbonated beverages. They tend to irritate the bladder.   Alcohol may irritate the prostate.   Only take over-the-counter or prescription medicines for pain, discomfort, or fever as directed by your caregiver.  To prevent further infections:  Empty the bladder often. Avoid holding urine for long periods of time.   After a bowel movement, women should cleanse from front to back. Use each tissue only once.   Empty the bladder before and after sexual intercourse.  FINDING OUT THE RESULTS OF YOUR TEST Not all test results are available during your visit. If your or your child's test results are not back during the visit, make an appointment with your caregiver to find out the results. Do not assume everything is normal if you have not heard from your caregiver or the medical facility. It is important for you to follow up on all test results. SEEK MEDICAL CARE IF:   There is back pain.   Your baby is older than 3 months with a rectal temperature of 100.5 F (38.1 C) or higher for more than 1 day.   Your or your child's problems (symptoms) are no better in 3 days. Return sooner if you or your child is getting worse.  SEEK IMMEDIATE MEDICAL CARE IF:   There is severe back pain or lower abdominal pain.   You or your child develops chills.   You have a fever.   Your baby is older than 3 months with a rectal temperature of 102 F (38.9 C) or higher.  Your baby is 76 months old or younger with a rectal temperature of 100.4 F (38 C) or higher.   There is nausea or vomiting.   There is continued burning or discomfort with urination.  MAKE SURE YOU:   Understand these  instructions.   Will watch your condition.   Will get help right away if you are not doing well or get worse.  Document Released: 12/08/2004 Document Revised: 02/17/2011 Document Reviewed: 07/13/2006 ExitCare Patient Information 2012 ExitCare, LLCUrine Culture Collection, Female  You will collect a sample of pee (urine) in a cup. Read the instructions below before beginning. If you have any questions, ask the nurse before you begin. Follow the instructions carefully. 1. Wash your hands with soap and water and dry them thoroughly.  2. Open the lid of the cup. Be careful not to touch the inside.  3. Clean the private (genital) area. 1. Sit over the toilet. Use the fingers of one hand to separate and hold open the folds of the skin in your private area.  2. Clean the pee (urinary) opening and surrounding area with the gauze, wiping from front to back. Throw away the gauze in the trash, not the toilet.  3. Repeat step "b"2 more times.  4. With the folds of skin still separated, pee a small amount into toilet. STOP, then pee into the cup. Fill the cup half way.  5. Put the lid on the cup tightly.  6. Wash your hands with soap and water.  7. If you were given a label, put the label on the cup.  8. Give the cup to the nurse.  Document Released: 02/11/2008 Document Revised: 02/17/2011 Document Reviewed: 02/11/2008 [facility] Patient Information 2012 [place], [place].Marland Kitchen

## 2011-08-21 NOTE — ED Provider Notes (Signed)
Medical screening examination/treatment/procedure(s) were performed by non-physician practitioner and as supervising physician I was immediately available for consultation/collaboration.  Timmey Lamba M Annalia Metzger, MD 08/21/11 0858 

## 2011-08-21 NOTE — ED Notes (Signed)
PA at bedside.

## 2011-08-21 NOTE — ED Notes (Signed)
Pt started having bil flank pain and immediately started vomiting, which led to watery diarrhea.  Denies urinary symptoms.

## 2011-10-25 ENCOUNTER — Emergency Department (HOSPITAL_COMMUNITY)
Admission: EM | Admit: 2011-10-25 | Discharge: 2011-10-25 | Disposition: A | Discharge status: Home / Self Care | Payer: Medicaid Other | Attending: Emergency Medicine | Admitting: Emergency Medicine

## 2011-10-25 ENCOUNTER — Encounter (HOSPITAL_COMMUNITY): Payer: Self-pay | Admitting: *Deleted

## 2011-10-25 DIAGNOSIS — S0500XA Injury of conjunctiva and corneal abrasion without foreign body, unspecified eye, initial encounter: Secondary | ICD-10-CM

## 2011-10-25 DIAGNOSIS — T1590XA Foreign body on external eye, part unspecified, unspecified eye, initial encounter: Secondary | ICD-10-CM | POA: Insufficient documentation

## 2011-10-25 DIAGNOSIS — S058X9A Other injuries of unspecified eye and orbit, initial encounter: Secondary | ICD-10-CM | POA: Insufficient documentation

## 2011-10-25 DIAGNOSIS — F17200 Nicotine dependence, unspecified, uncomplicated: Secondary | ICD-10-CM | POA: Insufficient documentation

## 2011-10-25 LAB — URINALYSIS, MICROSCOPIC ONLY
Bilirubin Urine: NEGATIVE
Glucose, UA: NEGATIVE mg/dL
Hgb urine dipstick: NEGATIVE
Protein, ur: NEGATIVE mg/dL

## 2011-10-25 MED ORDER — TOBRAMYCIN 0.3 % OP SOLN: 1.0000 [drp] | OPHTHALMIC | Status: AC

## 2011-10-25 MED ORDER — FLUORESCEIN SODIUM 1 MG OP STRP
1.0000 | ORAL_STRIP | Freq: Once | OPHTHALMIC | Status: AC
  Administered 2011-10-25: 1 via OPHTHALMIC

## 2011-10-25 MED ORDER — FLUORESCEIN-BENOXINATE 0.25-0.4 % OP SOLN: 1.0000 [drp] | Freq: Once | OPHTHALMIC | Status: DC

## 2011-10-25 MED ORDER — TETRACAINE HCL 0.5 % OP SOLN
1.0000 [drp] | Freq: Once | OPHTHALMIC | Status: AC
  Administered 2011-10-25: 1 [drp] via OPHTHALMIC

## 2011-10-25 MED ORDER — TETRACAINE HCL 0.5 % OP SOLN
OPHTHALMIC | Status: AC
  Administered 2011-10-25: 1 [drp] via OPHTHALMIC
  Filled 2011-10-25: qty 2

## 2011-10-25 MED ORDER — FLUORESCEIN SODIUM 1 MG OP STRP
ORAL_STRIP | OPHTHALMIC | Status: AC
  Administered 2011-10-25: 1 via OPHTHALMIC
  Filled 2011-10-25: qty 2

## 2011-10-25 NOTE — ED Notes (Signed)
Pt is here with bilateral eye irritation and states when she woke up she took out contacts because they hurt.  Pt states she washed eyes out and now she is irritated

## 2011-10-25 NOTE — ED Provider Notes (Signed)
History    This chart was scribed for Rolan Bucco, MD, MD by Smitty Pluck. The patient was seen in room TR02C and the patient's care was started at 3:16PM.   CSN: [id number]  Arrival date & time 10/25/11  1311   First MD Initiated Contact with Patient 10/25/11 1507      Chief Complaint  Patient presents with  . Eye Problem    (Consider location/radiation/quality/duration/timing/severity/associated sxs/prior treatment) Patient is a 26 y.o. female presenting with eye problem. The history is provided by the patient.  Eye Problem  Associated symptoms include photophobia and eye redness. Pertinent negatives include no nausea, no vomiting and no weakness.   Alicia Castro is a 26 y.o. female who presents to the Emergency Department complaining of constant, moderate bilateral eye pain, photophobia and eye irritation onset today. Pt reports using colored contacts (nonprescription) for 3 days and she takes them out every night. Pt reports her eyes were irritated last night after taking contacts out. Denies hx of similar symptoms.  Reports that she has had watery eyes. Denies changes in vision. Reports having mild headache. Using cool compress helps alleviate the pain.     History reviewed. No pertinent past medical history.  History reviewed. No pertinent past surgical history.  No family history on file.  History  Substance Use Topics  . Smoking status: Current Everyday Smoker -- 0.5 packs/day  . Smokeless tobacco: Not on file  . Alcohol Use: Yes     ocassionally    OB History    Grav Para Term Preterm Abortions TAB SAB Ect Mult Living                  Review of Systems  Constitutional: Negative for fever.  Eyes: Positive for photophobia, pain, redness and itching. Negative for visual disturbance.  Gastrointestinal: Negative for nausea and vomiting.  Neurological: Negative for syncope and weakness.    Allergies  Review of patient's allergies indicates no known  allergies.  Home Medications   Current Outpatient Rx  Name Route Sig Dispense Refill  . IBUPROFEN 200 MG PO TABS Oral Take 200-400 mg by mouth every 6 (six) hours as needed. For pain    . TOBRAMYCIN SULFATE 0.3 % OP SOLN Both Eyes Place 1 drop into both eyes every 4 (four) hours. 5 mL 0    BP 106/68  Pulse 88  Temp 97.3 F (36.3 C) (Oral)  Resp 18  SpO2 99%  LMP 10/13/2011  Physical Exam  Nursing note and vitals reviewed. Constitutional: She is oriented to person, place, and time. She appears well-developed and well-nourished. No distress.  Eyes: EOM and lids are normal. Pupils are equal, round, and reactive to light. No foreign body present in the right eye. No foreign body present in the left eye.       Mild erythema at conjunctiva bilaterally Watery discharge Small , punctate uptake of flouroscein bilaterally over cornea.  Slit lamp not working.   Pulmonary/Chest: Effort normal. No respiratory distress.  Neurological: She is alert and oriented to person, place, and time.  Skin: Skin is warm and dry.  Psychiatric: She has a normal mood and affect. Her behavior is normal.    ED Course  Procedures (including critical care time) DIAGNOSTIC STUDIES: Oxygen Saturation is 99% on room air, normal by my interpretation.    COORDINATION OF CARE: 3:08PM EDP discusses pt ED treatment with pt  3:08PM EDP ordered medication  Scheduled Meds:    . fluorescein  1  strip Both Eyes Once  . tetracaine  1 drop Both Eyes Once  . DISCONTD: fluorescein-benoxinate  1 drop Both Eyes Once   Continuous Infusions:  PRN Meds:.    Results for orders placed during the hospital encounter of 10/25/11  URINALYSIS, WITH MICROSCOPIC      Component Value Range   Color, Urine YELLOW  YELLOW   APPearance CLEAR  CLEAR   Specific Gravity, Urine 1.021  1.005 - 1.030   pH 8.0  5.0 - 8.0   Glucose, UA NEGATIVE  NEGATIVE mg/dL   Hgb urine dipstick NEGATIVE  NEGATIVE   Bilirubin Urine NEGATIVE   NEGATIVE   Ketones, ur NEGATIVE  NEGATIVE mg/dL   Protein, ur NEGATIVE  NEGATIVE mg/dL   Urobilinogen, UA 1.0  0.0 - 1.0 mg/dL   Nitrite NEGATIVE  NEGATIVE   Leukocytes, UA NEGATIVE  NEGATIVE   WBC, UA 0-2  <3 WBC/hpf   Squamous Epithelial / LPF RARE  RARE   No results found.  No results found.   1. Corneal abrasion       MDM  Vision normal, no signs of infection.  Likely small corneal abrasions.  Will start tobrex eye drops, refer to opthomology if symptoms not better within 2 days      I personally performed the services described in this documentation, which was scribed in my presence.  The recorded information has been reviewed and considered.    Rolan Bucco, MD 10/25/11 [phone]

## 2011-10-25 NOTE — ED Notes (Signed)
Pt had some pain to right flank area into abdomen adn was relieved with urinating

## 2012-05-06 ENCOUNTER — Encounter (HOSPITAL_COMMUNITY): Payer: Self-pay | Admitting: Emergency Medicine

## 2012-05-06 ENCOUNTER — Emergency Department (HOSPITAL_COMMUNITY)
Admission: EM | Admit: 2012-05-06 | Discharge: 2012-05-06 | Disposition: A | Discharge status: Home / Self Care | Payer: Medicaid Other | Attending: Emergency Medicine | Admitting: Emergency Medicine

## 2012-05-06 DIAGNOSIS — Z8619 Personal history of other infectious and parasitic diseases: Secondary | ICD-10-CM | POA: Insufficient documentation

## 2012-05-06 DIAGNOSIS — F17200 Nicotine dependence, unspecified, uncomplicated: Secondary | ICD-10-CM | POA: Insufficient documentation

## 2012-05-06 DIAGNOSIS — R109 Unspecified abdominal pain: Secondary | ICD-10-CM | POA: Insufficient documentation

## 2012-05-06 DIAGNOSIS — Z3202 Encounter for pregnancy test, result negative: Secondary | ICD-10-CM | POA: Insufficient documentation

## 2012-05-06 DIAGNOSIS — M549 Dorsalgia, unspecified: Secondary | ICD-10-CM | POA: Insufficient documentation

## 2012-05-06 DIAGNOSIS — N390 Urinary tract infection, site not specified: Secondary | ICD-10-CM | POA: Insufficient documentation

## 2012-05-06 LAB — WET PREP, GENITAL
Trich, Wet Prep: NONE SEEN
Yeast Wet Prep HPF POC: NONE SEEN

## 2012-05-06 LAB — URINALYSIS, MICROSCOPIC ONLY
Bilirubin Urine: NEGATIVE
Hgb urine dipstick: NEGATIVE
Ketones, ur: NEGATIVE mg/dL
Nitrite: NEGATIVE
Specific Gravity, Urine: 1.018 (ref 1.005–1.030)
pH: 6.5 (ref 5.0–8.0)

## 2012-05-06 LAB — COMPREHENSIVE METABOLIC PANEL
ALT: 10 U/L (ref 0–35)
Albumin: 3.7 g/dL (ref 3.5–5.2)
Alkaline Phosphatase: 46 U/L (ref 39–117)
BUN: 13 mg/dL (ref 6–23)
Chloride: 104 mEq/L (ref 96–112)
Glucose, Bld: 82 mg/dL (ref 70–99)
Potassium: 4.2 mEq/L (ref 3.5–5.1)
Sodium: 138 mEq/L (ref 135–145)
Total Bilirubin: 0.3 mg/dL (ref 0.3–1.2)

## 2012-05-06 LAB — POCT PREGNANCY, URINE: Preg Test, Ur: NEGATIVE

## 2012-05-06 LAB — CBC WITH DIFFERENTIAL/PLATELET
Hemoglobin: 13.6 g/dL (ref 12.0–15.0)
Lymphs Abs: 1.6 10*3/uL (ref 0.7–4.0)
Monocytes Relative: 13 % — ABNORMAL HIGH (ref 3–12)
Neutro Abs: 4 10*3/uL (ref 1.7–7.7)
Neutrophils Relative %: 61 % (ref 43–77)
Platelets: 190 10*3/uL (ref 150–400)
RBC: 4.24 MIL/uL (ref 3.87–5.11)
WBC: 6.6 10*3/uL (ref 4.0–10.5)

## 2012-05-06 LAB — LIPASE, BLOOD: Lipase: 19 U/L (ref 11–59)

## 2012-05-06 MED ORDER — OXYCODONE-ACETAMINOPHEN 5-325 MG PO TABS
1.0000 | ORAL_TABLET | Freq: Once | ORAL | Status: AC
  Administered 2012-05-06: 1 via ORAL
  Filled 2012-05-06: qty 1

## 2012-05-06 MED ORDER — SULFAMETHOXAZOLE-TRIMETHOPRIM 800-160 MG PO TABS: 1.0000 | ORAL_TABLET | Freq: Two times a day (BID) | ORAL | Status: DC

## 2012-05-06 MED ORDER — HYDROCODONE-ACETAMINOPHEN 5-325 MG PO TABS: 1.0000 | ORAL_TABLET | Freq: Four times a day (QID) | ORAL | Status: DC | PRN

## 2012-05-06 NOTE — ED Notes (Signed)
Pt presenting to ed with c/o abdominal pain since Friday pt denies nausea and vomiting and diarrhea at this time.

## 2012-05-06 NOTE — ED Provider Notes (Signed)
I saw and evaluated the patient, reviewed the resident's note and I agree with the findings and plan. Patient with lower abdominal pain and some flank pain. Also complaining of mild epigastric pain. Pelvic exam is unrevealing. UA suggestive of UTI. Patient treated with antibiotics  Gwyneth Sprout, MD 05/06/12 1547

## 2012-05-06 NOTE — ED Notes (Addendum)
Pelvic speciman collected by Dr Anitra Lauth and Resident Mclean.

## 2012-05-06 NOTE — ED Provider Notes (Signed)
History     CSN: [id number]  Arrival date & time 05/06/12  1300   First MD Initiated Contact with Patient 05/06/12 1337      Chief Complaint  Patient presents with  . Abdominal Pain    (Consider location/radiation/quality/duration/timing/severity/associated sxs/prior treatment) HPI Comments: 27 y.o PMH STD's presents with abdominal pain in linear distribution down midline abdomen and b/l lower extremity pain which she describes as soreness.  Associated with left back pain. Pain 7-8/10.  Sexually active with 3 partners without condom use.  Tried Advil with temporary relief.  Pain worse with movement and when urinating ab discomfort is worse.  Duration 2 days. LMP ended Thursday  PCP: [facility]   Patient is a 27 y.o. female presenting with abdominal pain. The history is provided by the patient. No language interpreter was used.  Abdominal Pain Pain location:  L flank (linearly down midline. lower abdominal pain b/l) Pain quality comment:  Sorenesss Pain radiates to:  Does not radiate Duration:  2 days Context comment:  Sexually active with 3 partners  Relieved by: some relief with ibuprofen  Worsened by:  Movement Associated symptoms: no anorexia, no chills, no dysuria, no fever, no nausea, no sore throat, no vaginal discharge and no vomiting   Risk factors: NSAID use     History reviewed. No pertinent past medical history.  History reviewed. No pertinent past surgical history.  No family history on file.  History  Substance Use Topics  . Smoking status: Current Every Day Smoker -- 0.50 packs/day    Types: Cigarettes  . Smokeless tobacco: Not on file  . Alcohol Use: Yes     Comment: ocassionally    OB History   Grav Para Term Preterm Abortions TAB SAB Ect Mult Living                  Review of Systems  Constitutional: Negative for fever and chills.  HENT: Negative for sore throat.   Gastrointestinal: Positive for abdominal pain. Negative for nausea, vomiting  and anorexia.  Genitourinary: Negative for dysuria and vaginal discharge.  All other systems reviewed and are negative.    Allergies  Review of patient's allergies indicates no known allergies.  Home Medications   Current Outpatient Rx  Name  Route  Sig  Dispense  Refill  . ibuprofen (ADVIL,MOTRIN) 200 MG tablet   Oral   Take 200 mg by mouth every 6 (six) hours as needed. For pain         . HYDROcodone-acetaminophen (NORCO) 5-325 MG per tablet   Oral   Take 1 tablet by mouth every 6 (six) hours as needed for pain.   30 tablet   0   . sulfamethoxazole-trimethoprim (SEPTRA DS) 800-160 MG per tablet   Oral   Take 1 tablet by mouth 2 (two) times daily. For 7 days. Do not take with alcohol. ANTIBIOTIC   14 tablet   0     BP 107/61  Pulse 93  Temp(Src) 98.7 F (37.1 C) (Oral)  Resp 20  SpO2 100%  LMP 04/29/2012  Physical Exam  Nursing note and vitals reviewed. Constitutional: She is oriented to person, place, and time. Vital signs are normal. She appears well-developed and well-nourished. She is cooperative. No distress.  HENT:  Head: Normocephalic and atraumatic.  Mouth/Throat: Oropharynx is clear and moist and mucous membranes are normal. No oropharyngeal exudate.  Eyes: Conjunctivae are normal. Pupils are equal, round, and reactive to light. Right eye exhibits no discharge. Left  eye exhibits no discharge. No scleral icterus.  Cardiovascular: Normal rate, regular rhythm, S1 normal, S2 normal and normal heart sounds.   No murmur heard. Pulmonary/Chest: Effort normal and breath sounds normal. No respiratory distress. She has no wheezes.  Abdominal: Soft. Bowel sounds are normal. There is tenderness. There is CVA tenderness.  Obese ab Mild ttp b/l lower ab R>L Mild left CVA ttp  Genitourinary: Pelvic exam was performed with patient supine. There is no lesion on the right labia. There is no lesion on the left labia. Cervix exhibits discharge. Cervix exhibits no motion  tenderness and no friability.  Thin discharge minimal  Neurological: She is alert and oriented to person, place, and time. Gait normal.  Skin: Skin is warm, dry and intact. No rash noted. She is not diaphoretic.  Psychiatric: She has a normal mood and affect. Her speech is normal and behavior is normal. Judgment and thought content normal. Cognition and memory are normal.    ED Course  Procedures (including critical care time)  Labs Reviewed  WET PREP, GENITAL - Abnormal; Notable for the following:    Clue Cells Wet Prep HPF POC FEW (*)    WBC, Wet Prep HPF POC FEW (*)    All other components within normal limits  CBC WITH DIFFERENTIAL - Abnormal; Notable for the following:    Monocytes Relative 13 (*)    All other components within normal limits  URINALYSIS, MICROSCOPIC ONLY - Abnormal; Notable for the following:    APPearance CLOUDY (*)    Leukocytes, UA MODERATE (*)    All other components within normal limits  GC/CHLAMYDIA PROBE AMP  URINE CULTURE  COMPREHENSIVE METABOLIC PANEL  LIPASE, BLOOD  RPR  HIV ANTIBODY (ROUTINE TESTING)  POCT PREGNANCY, URINE   No results found.   1. Abdominal pain   2. UTI (lower urinary tract infection)       MDM  Urine preg negative, UA, CMET, cbc, lipase Bactrim ds bid x 7 days Norco prn F/u with PCP as needed  Shirlee Latch MD [phone]        Annett Gula, MD 05/06/12 [phone]

## 2012-05-08 LAB — URINE CULTURE

## 2012-05-08 LAB — GC/CHLAMYDIA PROBE AMP: CT Probe RNA: NEGATIVE

## 2012-05-09 ENCOUNTER — Other Ambulatory Visit: Payer: Self-pay | Admitting: Family Medicine

## 2012-05-09 ENCOUNTER — Other Ambulatory Visit (HOSPITAL_COMMUNITY)
Admission: RE | Admit: 2012-05-09 | Discharge: 2012-05-09 | Disposition: A | Discharge status: Home / Self Care | Payer: Medicaid Other | Source: Ambulatory Visit | Attending: Family Medicine | Admitting: Family Medicine

## 2012-05-09 ENCOUNTER — Telehealth (HOSPITAL_COMMUNITY): Payer: Self-pay | Admitting: Emergency Medicine

## 2012-05-09 DIAGNOSIS — Z01419 Encounter for gynecological examination (general) (routine) without abnormal findings: Secondary | ICD-10-CM | POA: Insufficient documentation

## 2012-05-09 NOTE — ED Notes (Signed)
Patient has +Gonorrhea. Checking to see if appropriately treated. °

## 2012-05-09 NOTE — ED Notes (Signed)
+  Gonorrhea. Chart sent to EDP office for review. [facility] attached. °

## 2012-05-10 ENCOUNTER — Encounter (HOSPITAL_COMMUNITY): Payer: Self-pay

## 2012-05-10 ENCOUNTER — Emergency Department (HOSPITAL_COMMUNITY)
Admission: EM | Admit: 2012-05-10 | Discharge: 2012-05-10 | Disposition: A | Discharge status: Home / Self Care | Payer: Medicaid Other | Attending: Emergency Medicine | Admitting: Emergency Medicine

## 2012-05-10 DIAGNOSIS — R109 Unspecified abdominal pain: Secondary | ICD-10-CM | POA: Insufficient documentation

## 2012-05-10 DIAGNOSIS — A549 Gonococcal infection, unspecified: Secondary | ICD-10-CM

## 2012-05-10 DIAGNOSIS — F17200 Nicotine dependence, unspecified, uncomplicated: Secondary | ICD-10-CM | POA: Insufficient documentation

## 2012-05-10 DIAGNOSIS — A5400 Gonococcal infection of lower genitourinary tract, unspecified: Secondary | ICD-10-CM | POA: Insufficient documentation

## 2012-05-10 DIAGNOSIS — Z87440 Personal history of urinary (tract) infections: Secondary | ICD-10-CM | POA: Insufficient documentation

## 2012-05-10 HISTORY — DX: Urinary tract infection, site not specified: N39.0

## 2012-05-10 MED ORDER — CEFTRIAXONE SODIUM 250 MG IJ SOLR
250.0000 mg | Freq: Once | INTRAMUSCULAR | Status: AC
Start: 2012-05-10 — End: 2012-05-10
  Administered 2012-05-10: 250 mg via INTRAMUSCULAR
  Filled 2012-05-10: qty 250

## 2012-05-10 MED ORDER — ONDANSETRON 4 MG PO TBDP
4.0000 mg | ORAL_TABLET | Freq: Once | ORAL | Status: AC
  Administered 2012-05-10: 4 mg via ORAL
  Filled 2012-05-10: qty 1

## 2012-05-10 MED ORDER — AZITHROMYCIN 1 G PO PACK
1.0000 g | PACK | Freq: Once | ORAL | Status: AC
  Administered 2012-05-10: 1 g via ORAL
  Filled 2012-05-10: qty 1

## 2012-05-10 NOTE — ED Notes (Signed)
Patient states that she has had lower abdominal pain x 6 days and was seen in the ED 4 days ago. Patient states that she was called today and ws told that she was positive for gonorrhea.

## 2012-05-13 NOTE — ED Provider Notes (Signed)
History    27 year old female presenting for evaluation after being called with a positive gonorrhea culture result. Patient was evaluated in emergency room on February 23. She is having abdominal pain and discharge at that time. Urinalysis was suggestive of infection she started on antibiotics. Cultures for gonorrhea/chlamydia were obtained at that time as well. Patient reports any new complaints since his last evaluated.  CSN: [id number]  Arrival date & time 05/10/12  1740   First MD Initiated Contact with Patient 05/10/12 1807      Chief Complaint  Patient presents with  . Vaginal Discharge  . Abdominal Pain    (Consider location/radiation/quality/duration/timing/severity/associated sxs/prior treatment) HPI  Past Medical History  Diagnosis Date  . UTI (lower urinary tract infection)     History reviewed. No pertinent past surgical history.  History reviewed. No pertinent family history.  History  Substance Use Topics  . Smoking status: Current Every Day Smoker -- 0.50 packs/day    Types: Cigarettes  . Smokeless tobacco: Never Used  . Alcohol Use: Yes     Comment: ocassionally    OB History   Grav Para Term Preterm Abortions TAB SAB Ect Mult Living                  Review of Systems  All systems reviewed and negative, other than as noted in HPI.   Allergies  Review of patient's allergies indicates no known allergies.  Home Medications   Current Outpatient Rx  Name  Route  Sig  Dispense  Refill  . HYDROcodone-acetaminophen (NORCO) 5-325 MG per tablet   Oral   Take 1 tablet by mouth every 6 (six) hours as needed for pain.   30 tablet   0   . sulfamethoxazole-trimethoprim (SEPTRA DS) 800-160 MG per tablet   Oral   Take 1 tablet by mouth 2 (two) times daily. For 7 days. Do not take with alcohol. ANTIBIOTIC   14 tablet   0     BP 118/69  Pulse 95  Temp(Src) 98.4 F (36.9 C) (Oral)  Resp 20  SpO2 100%  LMP 04/29/2012  Physical Exam  Nursing  note and vitals reviewed. Constitutional: She appears well-developed and well-nourished. No distress.  HENT:  Head: Normocephalic and atraumatic.  Eyes: Conjunctivae are normal. Right eye exhibits no discharge. Left eye exhibits no discharge.  Neck: Neck supple.  Cardiovascular: Normal rate, regular rhythm and normal heart sounds.  Exam reveals no gallop and no friction rub.   No murmur heard. Pulmonary/Chest: Effort normal and breath sounds normal. No respiratory distress.  Abdominal: Soft. She exhibits no distension. There is no tenderness.  Musculoskeletal: She exhibits no edema and no tenderness.  Neurological: She is alert.  Skin: Skin is warm and dry.  Psychiatric: She has a normal mood and affect. Her behavior is normal. Thought content normal.    ED Course  Procedures (including critical care time)  Labs Reviewed - No data to display No results found.   1. Gonorrhea in female       MDM  27 year old female report back to ED after being called with a positive culture for gonorrhea. She reports that her symptoms haven't improved since last evaluated. Patient was treated for gonorrhea and chlamydia. Discussed the need to have all sexual partners tested as well and to refrain from sexual contact until this is done. Return precautions were discussed.        Raeford Razor, MD 05/13/12 [phone]

## 2012-05-16 NOTE — ED Notes (Signed)
Patient returned and was treated with Rocephin and Zithromax. [facility] faxed

## 2012-08-19 ENCOUNTER — Encounter (HOSPITAL_COMMUNITY): Payer: Self-pay | Admitting: *Deleted

## 2012-08-19 ENCOUNTER — Emergency Department (HOSPITAL_COMMUNITY)
Admission: EM | Admit: 2012-08-19 | Discharge: 2012-08-19 | Disposition: A | Discharge status: Home / Self Care | Payer: Medicaid Other | Attending: Emergency Medicine | Admitting: Emergency Medicine

## 2012-08-19 DIAGNOSIS — Z87440 Personal history of urinary (tract) infections: Secondary | ICD-10-CM | POA: Insufficient documentation

## 2012-08-19 DIAGNOSIS — R51 Headache: Secondary | ICD-10-CM | POA: Insufficient documentation

## 2012-08-19 DIAGNOSIS — R110 Nausea: Secondary | ICD-10-CM | POA: Insufficient documentation

## 2012-08-19 DIAGNOSIS — J020 Streptococcal pharyngitis: Secondary | ICD-10-CM | POA: Insufficient documentation

## 2012-08-19 DIAGNOSIS — R1310 Dysphagia, unspecified: Secondary | ICD-10-CM | POA: Insufficient documentation

## 2012-08-19 DIAGNOSIS — F17200 Nicotine dependence, unspecified, uncomplicated: Secondary | ICD-10-CM | POA: Insufficient documentation

## 2012-08-19 MED ORDER — PENICILLIN G BENZATHINE 1200000 UNIT/2ML IM SUSP
1.2000 10*6.[IU] | Freq: Once | INTRAMUSCULAR | Status: AC
  Administered 2012-08-19: 1.2 10*6.[IU] via INTRAMUSCULAR
  Filled 2012-08-19: qty 2

## 2012-08-19 NOTE — ED Notes (Signed)
Pt c/o sore throat x 1 week. Pt states ibuprofen helped for a little while. Pt incidentally c/o headaches and abdominal pain and nausea.

## 2012-08-19 NOTE — ED Notes (Addendum)
Patient c/o sore throat for 1 week with headache, nausea, and abd pain. Patient was taking ibuprofen at home, it has stopped helping. Also c/o "neck" lymph node pain.

## 2012-08-19 NOTE — ED Provider Notes (Signed)
Medical screening examination/treatment/procedure(s) were performed by non-physician practitioner and as supervising physician I was immediately available for consultation/collaboration.  Olivia Mackie, MD 08/19/12 [phone]

## 2012-08-19 NOTE — ED Provider Notes (Signed)
History     CSN: [id number]  Arrival date & time 08/19/12  0210   First MD Initiated Contact with Patient 08/19/12 [phone]      Chief Complaint  Patient presents with  . Sore Throat    (Consider location/radiation/quality/duration/timing/severity/associated sxs/prior treatment) HPI Comments:  She states she's had a sore throat for the past, week.  She's been taking ibuprofen, without relief she also is complaining of headache, some vague abdominal pain, and nausea, without vomiting.  She decided to scout worse.  She feels, like the nodes, in her throat.  Had increased in size and are more tender to touch, able to eat and drink, but with discomfort  Patient is a 27 y.o. female presenting with pharyngitis. The history is provided by the patient.  Sore Throat This is a new problem. The current episode started in the past 7 days. The problem occurs constantly. The problem has been gradually worsening. Associated symptoms include headaches, nausea, a sore throat and swollen glands. Pertinent negatives include no abdominal pain, chills, congestion, coughing, fever, joint swelling, vomiting or weakness. The symptoms are aggravated by swallowing. She has tried nothing for the symptoms. The treatment provided no relief.    Past Medical History  Diagnosis Date  . UTI (lower urinary tract infection)     History reviewed. No pertinent past surgical history.  History reviewed. No pertinent family history.  History  Substance Use Topics  . Smoking status: Current Every Day Smoker -- 0.50 packs/day    Types: Cigarettes  . Smokeless tobacco: Never Used  . Alcohol Use: Yes     Comment: ocassionally    OB History   Grav Para Term Preterm Abortions TAB SAB Ect Mult Living                  Review of Systems  Constitutional: Negative for fever and chills.  HENT: Positive for sore throat and trouble swallowing. Negative for congestion and rhinorrhea.   Respiratory: Negative for cough.    Gastrointestinal: Positive for nausea. Negative for vomiting and abdominal pain.  Musculoskeletal: Negative for joint swelling.  Neurological: Positive for headaches. Negative for dizziness and weakness.  All other systems reviewed and are negative.    Allergies  Review of patient's allergies indicates no known allergies.  Home Medications   Current Outpatient Rx  Name  Route  Sig  Dispense  Refill  . ibuprofen (ADVIL,MOTRIN) 200 MG tablet   Oral   Take 200 mg by mouth every 6 (six) hours as needed for pain.           BP 105/71  Pulse 107  Temp(Src) 99.1 F (37.3 C) (Oral)  Resp 20  Ht 5\' 5"  (1.651 m)  Wt 200 lb (90.719 kg)  BMI 33.28 kg/m2  SpO2 100%  LMP 07/20/2012  Physical Exam  Nursing note and vitals reviewed. Constitutional: She is oriented to person, place, and time. She appears well-developed and well-nourished. No distress.  HENT:  Head: Normocephalic and atraumatic.  Mouth/Throat: Uvula is midline. Posterior oropharyngeal erythema present.  Eyes: Pupils are equal, round, and reactive to light.  Cardiovascular: Regular rhythm.  Tachycardia present.   Pulmonary/Chest: Effort normal and breath sounds normal.  Musculoskeletal: Normal range of motion. She exhibits no edema and no tenderness.  Lymphadenopathy:    She has cervical adenopathy.       Right cervical: Superficial cervical adenopathy present.       Left cervical: Superficial cervical adenopathy present.  Neurological: She is alert and  oriented to person, place, and time.  Skin: Skin is warm and dry. No rash noted.    ED Course  Procedures (including critical care time)  Labs Reviewed  RAPID STREP SCREEN - Abnormal; Notable for the following:    Streptococcus, Group A Screen (Direct) POSITIVE (*)    All other components within normal limits   No results found.   1. Strep pharyngitis       MDM   Patient's strep test is positive.  She has opted for IM Bicillin, which will be given in  the emergency department       Arman Filter, NP 08/19/12 0400

## 2012-09-18 ENCOUNTER — Inpatient Hospital Stay (HOSPITAL_COMMUNITY): Payer: Medicaid Other

## 2012-09-18 ENCOUNTER — Inpatient Hospital Stay (HOSPITAL_COMMUNITY)
Admission: AD | Admit: 2012-09-18 | Discharge: 2012-09-18 | Disposition: A | Discharge status: Home / Self Care | Payer: Medicaid Other | Source: Ambulatory Visit | Attending: Obstetrics & Gynecology | Admitting: Obstetrics & Gynecology

## 2012-09-18 ENCOUNTER — Encounter (HOSPITAL_COMMUNITY): Payer: Self-pay | Admitting: Obstetrics and Gynecology

## 2012-09-18 DIAGNOSIS — B9689 Other specified bacterial agents as the cause of diseases classified elsewhere: Secondary | ICD-10-CM

## 2012-09-18 DIAGNOSIS — K5900 Constipation, unspecified: Secondary | ICD-10-CM

## 2012-09-18 DIAGNOSIS — R109 Unspecified abdominal pain: Secondary | ICD-10-CM | POA: Insufficient documentation

## 2012-09-18 DIAGNOSIS — M549 Dorsalgia, unspecified: Secondary | ICD-10-CM | POA: Insufficient documentation

## 2012-09-18 DIAGNOSIS — N760 Acute vaginitis: Secondary | ICD-10-CM | POA: Insufficient documentation

## 2012-09-18 DIAGNOSIS — A499 Bacterial infection, unspecified: Secondary | ICD-10-CM | POA: Insufficient documentation

## 2012-09-18 LAB — WET PREP, GENITAL

## 2012-09-18 LAB — URINALYSIS, ROUTINE W REFLEX MICROSCOPIC
Bilirubin Urine: NEGATIVE
Glucose, UA: NEGATIVE mg/dL
Hgb urine dipstick: NEGATIVE
Ketones, ur: NEGATIVE mg/dL
Protein, ur: NEGATIVE mg/dL
pH: 7.5 (ref 5.0–8.0)

## 2012-09-18 LAB — CBC
HCT: 35.7 % — ABNORMAL LOW (ref 36.0–46.0)
Hemoglobin: 12.1 g/dL (ref 12.0–15.0)
MCHC: 33.9 g/dL (ref 30.0–36.0)
RBC: 3.79 MIL/uL — ABNORMAL LOW (ref 3.87–5.11)
WBC: 5.6 10*3/uL (ref 4.0–10.5)

## 2012-09-18 LAB — URINE MICROSCOPIC-ADD ON

## 2012-09-18 LAB — HCG, QUANTITATIVE, PREGNANCY: hCG, Beta Chain, Quant, S: 71097 m[IU]/mL — ABNORMAL HIGH (ref <5)

## 2012-09-18 MED ORDER — POLYETHYLENE GLYCOL 3350 17 G PO PACK: 17.0000 g | PACK | Freq: Every day | ORAL | Status: DC

## 2012-09-18 MED ORDER — METRONIDAZOLE 500 MG PO TABS: 500.0000 mg | ORAL_TABLET | Freq: Two times a day (BID) | ORAL | Status: DC

## 2012-09-18 NOTE — MAU Provider Note (Signed)
History     CSN: [id number]  Arrival date and time: 09/18/12 1102   First Provider Initiated Contact with Patient 09/18/12 1217      Chief Complaint  Patient presents with  . Possible Pregnancy  . Abdominal Pain  . Back Pain   HPI The patient is a 27 year old [id number] black female presenting for crampy abdominal pain and back pain.  She says the pain started a week ago and is constant and unchanging.  She said it hurts more on left lower abdomen and back.  Has tried ibuprofen without success. Reports constipation. She is not suspicious of STD's but would like to be be tested to know her status.  Patient denies vaginal discharge, itching, and odor.   Pertinent Gynecological History: Sexually transmitted diseases: gonorrhea in March 2014   Past Medical History  Diagnosis Date  . UTI (lower urinary tract infection)     Past Surgical History  Procedure Laterality Date  . No past surgeries      History reviewed. No pertinent family history.  History  Substance Use Topics  . Smoking status: Current Every Day Smoker -- 0.50 packs/day    Types: Cigarettes  . Smokeless tobacco: Never Used  . Alcohol Use: No     Comment: ocassionally- not since pregnancy     Allergies: No Known Allergies  Prescriptions prior to admission  Medication Sig Dispense Refill  . Aspirin-Salicylamide-Caffeine (BC HEADACHE POWDER PO) Take 1 packet by mouth daily as needed (headaches).      Marland Kitchen ibuprofen (ADVIL,MOTRIN) 200 MG tablet Take 800 mg by mouth every 6 (six) hours as needed for pain.         ROS Reports abdominal and back pain Denies bleeding or discharge  Physical Exam   Blood pressure 113/65, pulse 94, temperature 98.1 F (36.7 C), temperature source Oral, resp. rate 18, height 5' 4.5" (1.638 m), weight 87.635 kg (193 lb 3.2 oz), last menstrual period 07/20/2012.  Physical Exam  Constitutional: She appears well-developed and well-nourished.  Cardiovascular: Normal rate.    Respiratory: Effort normal.  GI: Soft. There is tenderness.  Tenderness moreso in LLQ  Genitourinary:    Vaginal discharge found.  Skin colored/red lesion noted on right inner buttock.   Labs Results for orders placed during the hospital encounter of 09/18/12 (from the past 24 hour(s))  URINALYSIS, ROUTINE W REFLEX MICROSCOPIC     Status: Abnormal   Collection Time    09/18/12 11:10 AM      Result Value Range   Color, Urine YELLOW  YELLOW   APPearance CLEAR  CLEAR   Specific Gravity, Urine 1.015  1.005 - 1.030   pH 7.5  5.0 - 8.0   Glucose, UA NEGATIVE  NEGATIVE mg/dL   Hgb urine dipstick NEGATIVE  NEGATIVE   Bilirubin Urine NEGATIVE  NEGATIVE   Ketones, ur NEGATIVE  NEGATIVE mg/dL   Protein, ur NEGATIVE  NEGATIVE mg/dL   Urobilinogen, UA 0.2  0.0 - 1.0 mg/dL   Nitrite NEGATIVE  NEGATIVE   Leukocytes, UA TRACE (*) NEGATIVE  URINE MICROSCOPIC-ADD ON     Status: Abnormal   Collection Time    09/18/12 11:10 AM      Result Value Range   Squamous Epithelial / LPF FEW (*) RARE   WBC, UA 0-2  <3 WBC/hpf   Bacteria, UA RARE  RARE  POCT PREGNANCY, URINE     Status: Abnormal   Collection Time    09/18/12 11:26 AM  Result Value Range   Preg Test, Ur POSITIVE (*) NEGATIVE  CBC     Status: Abnormal   Collection Time    09/18/12 12:11 PM      Result Value Range   WBC 5.6  4.0 - 10.5 K/uL   RBC 3.79 (*) 3.87 - 5.11 MIL/uL   Hemoglobin 12.1  12.0 - 15.0 g/dL   HCT 16.1 (*) 09.6 - 04.5 %   MCV 94.2  78.0 - 100.0 fL   MCH 31.9  26.0 - 34.0 pg   MCHC 33.9  30.0 - 36.0 g/dL   RDW 40.9  81.1 - 91.4 %   Platelets 192  150 - 400 K/uL  HCG, QUANTITATIVE, PREGNANCY     Status: Abnormal   Collection Time    09/18/12 12:13 PM      Result Value Range   hCG, Beta Chain, Quant, [place] [address] (*) <5 mIU/mL  WET PREP, GENITAL     Status: Abnormal   Collection Time    09/18/12 12:25 PM      Result Value Range   Yeast Wet Prep HPF POC NONE SEEN  NONE SEEN   Trich, Wet Prep NONE SEEN  NONE  SEEN   Clue Cells Wet Prep HPF POC FEW (*) NONE SEEN   WBC, Wet Prep HPF POC FEW (*) NONE SEEN     MAU Course  Procedures    Assessment and Plan  A:  Mild bacterial vaginosis - will give flagyl   Roxan Hockey, Unique 09/18/2012, 12:28 PM   Seen also by me and agree with note See [place] below showing SIUP at 8.4 weeks Rx given for Flagyl and Miralax Follow up with Clinic visit    Medication List    STOP taking these medications       BC HEADACHE POWDER PO     ibuprofen 200 MG tablet  Commonly known as:  ADVIL,MOTRIN      TAKE these medications       metroNIDAZOLE 500 MG tablet  Commonly known as:  FLAGYL  Take 1 tablet (500 mg total) by mouth 2 (two) times daily.     polyethylene glycol packet  Commonly known as:  MIRALAX  Take 17 g by mouth daily.        Wynelle Bourgeois CNM

## 2012-09-18 NOTE — MAU Note (Signed)
Patient states she had a positive home pregnancy test about 3 weeks ago. Has been having abdominal and back pain for about 12 1/2 weeks with a heavier than normal discharge, no odor or bleeding.

## 2012-09-19 NOTE — MAU Provider Note (Signed)
Attestation of Attending Supervision of Advanced Practitioner (CNM/NP): Evaluation and management procedures were performed by the Advanced Practitioner under my supervision and collaboration. I have reviewed the Advanced Practitioner's note and chart, and I agree with the management and plan.  Kalen Ratajczak H. 1:30 PM

## 2012-09-20 LAB — GC/CHLAMYDIA PROBE AMP: CT Probe RNA: NEGATIVE

## 2012-09-23 ENCOUNTER — Encounter: Payer: Self-pay | Admitting: Advanced Practice Midwife

## 2012-09-23 DIAGNOSIS — Z3490 Encounter for supervision of normal pregnancy, unspecified, unspecified trimester: Secondary | ICD-10-CM | POA: Insufficient documentation

## 2012-09-23 DIAGNOSIS — O09899 Supervision of other high risk pregnancies, unspecified trimester: Secondary | ICD-10-CM | POA: Insufficient documentation

## 2012-10-01 ENCOUNTER — Encounter (HOSPITAL_COMMUNITY): Payer: Self-pay | Admitting: *Deleted

## 2012-10-01 ENCOUNTER — Inpatient Hospital Stay (HOSPITAL_COMMUNITY)
Admission: AD | Admit: 2012-10-01 | Discharge: 2012-10-01 | Disposition: A | Discharge status: Home / Self Care | Payer: Medicaid Other | Source: Ambulatory Visit | Attending: Obstetrics & Gynecology | Admitting: Obstetrics & Gynecology

## 2012-10-01 DIAGNOSIS — B3731 Acute candidiasis of vulva and vagina: Secondary | ICD-10-CM | POA: Insufficient documentation

## 2012-10-01 DIAGNOSIS — N949 Unspecified condition associated with female genital organs and menstrual cycle: Secondary | ICD-10-CM | POA: Insufficient documentation

## 2012-10-01 DIAGNOSIS — B373 Candidiasis of vulva and vagina: Secondary | ICD-10-CM | POA: Insufficient documentation

## 2012-10-01 LAB — WET PREP, GENITAL
Trich, Wet Prep: NONE SEEN
Yeast Wet Prep HPF POC: NONE SEEN

## 2012-10-01 LAB — URINALYSIS, ROUTINE W REFLEX MICROSCOPIC
Glucose, UA: NEGATIVE mg/dL
Hgb urine dipstick: NEGATIVE
Protein, ur: NEGATIVE mg/dL
Specific Gravity, Urine: 1.02 (ref 1.005–1.030)
pH: 7 (ref 5.0–8.0)

## 2012-10-01 MED ORDER — FLUCONAZOLE 150 MG PO TABS: 150.0000 mg | ORAL_TABLET | Freq: Once | ORAL | Status: DC

## 2012-10-01 MED ORDER — FLUCONAZOLE 150 MG PO TABS
150.0000 mg | ORAL_TABLET | Freq: Once | ORAL | Status: AC
  Administered 2012-10-01: 150 mg via ORAL
  Filled 2012-10-01: qty 1

## 2012-10-01 NOTE — MAU Provider Note (Signed)
History     CSN: [id number]  Arrival date and time: 10/01/12 7829   First Provider Initiated Contact with Patient 10/01/12 [phone]      Chief Complaint  Patient presents with  . Vaginal Discharge   HPI [name] is a 27 y.o. [phone] at [redacted]w[redacted]d who presents to MAU today with complaint of vaginal discharge since Friday. The patient was recently diagnosed with BV here and given Flagyl. She states that the discharge is thick and white and causing itching and irritation. She denies abdominal pain, vaginal bleeding or fever.    OB History   Grav Para Term Preterm Abortions TAB SAB Ect Mult Living   6 2 2  2 2    1       Past Medical History  Diagnosis Date  . UTI (lower urinary tract infection)     Past Surgical History  Procedure Laterality Date  . No past surgeries      History reviewed. No pertinent family history.  History  Substance Use Topics  . Smoking status: Current Every Day Smoker -- 0.50 packs/day    Types: Cigarettes  . Smokeless tobacco: Never Used  . Alcohol Use: No     Comment: ocassionally- not since pregnancy     Allergies: No Known Allergies  No prescriptions prior to admission    Review of Systems  Constitutional: Negative for fever and malaise/fatigue.  Gastrointestinal: Negative for abdominal pain.  Genitourinary:       + Vaginal discharge Neg - vaginal bleeding   Physical Exam   Blood pressure 106/62, pulse 78, resp. rate 18, height 5' 3.5" (1.613 m), weight 192 lb 8 oz (87.317 kg), last menstrual period 07/20/2012.  Physical Exam  Constitutional: She appears well-developed and well-nourished. No distress.  HENT:  Head: Normocephalic and atraumatic.  Cardiovascular: Normal rate, regular rhythm and normal heart sounds.   Respiratory: Effort normal and breath sounds normal. No respiratory distress.  GI: Soft. Bowel sounds are normal. She exhibits no distension and no mass. There is no tenderness. There is no rebound and no  guarding.  Genitourinary: Uterus is enlarged (appropriate for GA). Uterus is not tender. Cervix exhibits no motion tenderness, no discharge and no friability. Right adnexum displays no mass and no tenderness. Left adnexum displays no mass and no tenderness. No bleeding around the vagina. Vaginal discharge (moderate amount of thick, white discharge noted) found.  Neurological: She is alert.  Skin: Skin is warm and dry. No erythema.  Psychiatric: She has a normal mood and affect.   Results for orders placed during the hospital encounter of 10/01/12 (from the past 24 hour(s))  URINALYSIS, ROUTINE W REFLEX MICROSCOPIC     Status: Abnormal   Collection Time    10/01/12  8:17 AM      Result Value Range   Color, Urine YELLOW  YELLOW   APPearance CLEAR  CLEAR   Specific Gravity, Urine 1.020  1.005 - 1.030   pH 7.0  5.0 - 8.0   Glucose, UA NEGATIVE  NEGATIVE mg/dL   Hgb urine dipstick NEGATIVE  NEGATIVE   Bilirubin Urine NEGATIVE  NEGATIVE   Ketones, ur NEGATIVE  NEGATIVE mg/dL   Protein, ur NEGATIVE  NEGATIVE mg/dL   Urobilinogen, UA 0.2  0.0 - 1.0 mg/dL   Nitrite NEGATIVE  NEGATIVE   Leukocytes, UA TRACE (*) NEGATIVE  URINE MICROSCOPIC-ADD ON     Status: Abnormal   Collection Time    10/01/12  8:17 AM  Result Value Range   Squamous Epithelial / LPF RARE  RARE   WBC, UA 0-2  <3 WBC/hpf   Bacteria, UA FEW (*) RARE  WET PREP, GENITAL     Status: Abnormal   Collection Time    10/01/12  8:43 AM      Result Value Range   Yeast Wet Prep HPF POC NONE SEEN  NONE SEEN   Trich, Wet Prep NONE SEEN  NONE SEEN   Clue Cells Wet Prep HPF POC FEW (*) NONE SEEN   WBC, Wet Prep HPF POC FEW (*) NONE SEEN    MAU Course  Procedures None  MDM UA, Wet prep today  Assessment and Plan  A: Yeast vulvovaginitis, clinical  P: Discharge home 150 mg Diflucan given in MAU today Rx for Diflucan 150 mg given to be taken in 3 days Patient advised to start prenatal care as soon as possible. Plans to  go to Dr. Gaynell Face.  Patient may return to MAU as needed or if her condition were to change or worsen  Freddi Starr, PA-C 10/01/2012, 9:31 AM

## 2012-10-01 NOTE — MAU Note (Signed)
Pt states was seen on 09/18/2012, dx'd w/BV. Here now with vaginal itching, thick white discharge, thinks it is yeast.

## 2012-10-11 LAB — OB RESULTS CONSOLE HIV ANTIBODY (ROUTINE TESTING): HIV: NONREACTIVE

## 2012-10-11 LAB — OB RESULTS CONSOLE RPR: RPR: NONREACTIVE

## 2012-10-29 LAB — OB RESULTS CONSOLE GC/CHLAMYDIA
Chlamydia: NEGATIVE
Gonorrhea: NEGATIVE

## 2012-10-29 LAB — OB RESULTS CONSOLE ABO/RH: RH Type: POSITIVE

## 2012-10-29 LAB — OB RESULTS CONSOLE ANTIBODY SCREEN: ANTIBODY SCREEN: NEGATIVE

## 2012-10-29 LAB — OB RESULTS CONSOLE RUBELLA ANTIBODY, IGM: RUBELLA: IMMUNE

## 2012-10-29 LAB — OB RESULTS CONSOLE HEPATITIS B SURFACE ANTIGEN: HEP B S AG: NEGATIVE

## 2013-01-07 ENCOUNTER — Other Ambulatory Visit (HOSPITAL_COMMUNITY): Payer: Self-pay | Admitting: Obstetrics

## 2013-01-07 DIAGNOSIS — O358XX0 Maternal care for other (suspected) fetal abnormality and damage, not applicable or unspecified: Secondary | ICD-10-CM

## 2013-01-16 ENCOUNTER — Ambulatory Visit (HOSPITAL_COMMUNITY)
Admission: RE | Admit: 2013-01-16 | Discharge: 2013-01-16 | Disposition: A | Discharge status: Home / Self Care | Payer: Medicaid Other | Source: Ambulatory Visit | Attending: Obstetrics | Admitting: Obstetrics

## 2013-01-16 ENCOUNTER — Encounter (HOSPITAL_COMMUNITY): Payer: Self-pay

## 2013-01-16 DIAGNOSIS — O358XX0 Maternal care for other (suspected) fetal abnormality and damage, not applicable or unspecified: Secondary | ICD-10-CM

## 2013-01-16 DIAGNOSIS — Z363 Encounter for antenatal screening for malformations: Secondary | ICD-10-CM | POA: Insufficient documentation

## 2013-01-16 DIAGNOSIS — O09299 Supervision of pregnancy with other poor reproductive or obstetric history, unspecified trimester: Secondary | ICD-10-CM | POA: Insufficient documentation

## 2013-01-16 DIAGNOSIS — Z1389 Encounter for screening for other disorder: Secondary | ICD-10-CM | POA: Insufficient documentation

## 2013-01-16 DIAGNOSIS — O99330 Smoking (tobacco) complicating pregnancy, unspecified trimester: Secondary | ICD-10-CM | POA: Insufficient documentation

## 2013-02-12 ENCOUNTER — Other Ambulatory Visit (HOSPITAL_COMMUNITY): Payer: Self-pay | Admitting: Obstetrics

## 2013-02-12 DIAGNOSIS — O09299 Supervision of pregnancy with other poor reproductive or obstetric history, unspecified trimester: Secondary | ICD-10-CM

## 2013-02-13 ENCOUNTER — Encounter (HOSPITAL_COMMUNITY): Payer: Self-pay | Admitting: *Deleted

## 2013-02-13 ENCOUNTER — Inpatient Hospital Stay (HOSPITAL_COMMUNITY)
Admission: AD | Admit: 2013-02-13 | Discharge: 2013-02-13 | Disposition: A | Discharge status: Home / Self Care | Payer: Medicaid Other | Source: Ambulatory Visit | Attending: Obstetrics | Admitting: Obstetrics

## 2013-02-13 ENCOUNTER — Ambulatory Visit (HOSPITAL_COMMUNITY)
Admission: RE | Admit: 2013-02-13 | Discharge: 2013-02-13 | Disposition: A | Discharge status: Home / Self Care | Payer: Medicaid Other | Source: Ambulatory Visit | Attending: Obstetrics | Admitting: Obstetrics

## 2013-02-13 DIAGNOSIS — M549 Dorsalgia, unspecified: Secondary | ICD-10-CM | POA: Insufficient documentation

## 2013-02-13 DIAGNOSIS — O99330 Smoking (tobacco) complicating pregnancy, unspecified trimester: Secondary | ICD-10-CM | POA: Insufficient documentation

## 2013-02-13 DIAGNOSIS — O9989 Other specified diseases and conditions complicating pregnancy, childbirth and the puerperium: Secondary | ICD-10-CM

## 2013-02-13 DIAGNOSIS — O99891 Other specified diseases and conditions complicating pregnancy: Secondary | ICD-10-CM | POA: Insufficient documentation

## 2013-02-13 DIAGNOSIS — O09299 Supervision of pregnancy with other poor reproductive or obstetric history, unspecified trimester: Secondary | ICD-10-CM | POA: Insufficient documentation

## 2013-02-13 HISTORY — DX: Unspecified abnormal cytological findings in specimens from cervix uteri: R87.619

## 2013-02-13 HISTORY — DX: Chlamydial infection, unspecified: A74.9

## 2013-02-13 HISTORY — DX: Reserved for concepts with insufficient information to code with codable children: IMO0002

## 2013-02-13 HISTORY — DX: Trichomoniasis, unspecified: A59.9

## 2013-02-13 HISTORY — DX: Gonococcal infection, unspecified: A54.9

## 2013-02-13 LAB — URINALYSIS, ROUTINE W REFLEX MICROSCOPIC
Glucose, UA: NEGATIVE mg/dL
Hgb urine dipstick: NEGATIVE
Ketones, ur: NEGATIVE mg/dL
pH: 7 (ref 5.0–8.0)

## 2013-02-13 LAB — WET PREP, GENITAL
Clue Cells Wet Prep HPF POC: NONE SEEN
Trich, Wet Prep: NONE SEEN

## 2013-02-13 LAB — URINE MICROSCOPIC-ADD ON

## 2013-02-13 MED ORDER — NITROFURANTOIN MONOHYD MACRO 100 MG PO CAPS: 100.0000 mg | ORAL_CAPSULE | Freq: Two times a day (BID) | ORAL | Status: DC

## 2013-02-13 MED ORDER — PROMETHAZINE HCL 25 MG PO TABS
25.0000 mg | ORAL_TABLET | Freq: Once | ORAL | Status: AC
  Administered 2013-02-13: 25 mg via ORAL
  Filled 2013-02-13: qty 1

## 2013-02-13 MED ORDER — OXYCODONE-ACETAMINOPHEN 5-325 MG PO TABS
1.0000 | ORAL_TABLET | Freq: Once | ORAL | Status: AC
  Administered 2013-02-13: 1 via ORAL
  Filled 2013-02-13: qty 1

## 2013-02-13 NOTE — MAU Note (Signed)
Patient is in with c/o lower back pain and vaginal irritation since yesterday, denies dysuria, bleeding or LOF. She reports good fetal movement

## 2013-02-13 NOTE — MAU Provider Note (Signed)
History     CSN: [id number]  Arrival date and time: 02/13/13 0908   First Provider Initiated Contact with Patient 02/13/13 1000      Chief Complaint  Patient presents with  . Back Pain  . Vaginal Discharge   HPI Comments: Alicia Castro 27 y.o. [id number] presents to MAU today for UTI sx, which are mainly back pains. The pain started yesterday upon awaking. She states it hurts to walk but she has not taken anything for pains. Her pain is a 10/10 . She also complains of vaginal irritation, no new partner.   Back Pain  Vaginal Discharge Associated symptoms include back pain.     Past Medical History  Diagnosis Date  . UTI (lower urinary tract infection)   . Chlamydia   . Gonorrhea   . Trichomonas   . Abnormal Pap smear     Past Surgical History  Procedure Laterality Date  . Wisdom tooth extraction    . Colposcopy      History reviewed. No pertinent family history.  History  Substance Use Topics  . Smoking status: Current Every Day Smoker -- 0.25 packs/day    Types: Cigarettes  . Smokeless tobacco: Never Used  . Alcohol Use: No     Comment: ocassionally- not since pregnancy     Allergies: No Known Allergies  Prescriptions prior to admission  Medication Sig Dispense Refill  . Prenatal Vit-Fe Fumarate-FA (PRENATAL MULTIVITAMIN) TABS tablet Take 1 tablet by mouth daily at 12 noon.      . shark liver oil-cocoa butter (PREPARATION H) 0.25-3-85.5 % suppository Place 1 suppository rectally as needed for hemorrhoids.      . polyethylene glycol (MIRALAX) packet Take 17 g by mouth daily.  14 each  0    Review of Systems  Constitutional: Negative.   HENT: Negative.   Eyes: Negative.   Respiratory: Negative.   Cardiovascular: Negative.   Gastrointestinal: Negative.   Genitourinary: Negative.   Musculoskeletal: Positive for back pain.  Skin: Negative.   Neurological: Negative.   Psychiatric/Behavioral: Negative.    Physical Exam   Blood pressure 104/60,  pulse 96, temperature 98.5 F (36.9 C), temperature source Oral, resp. rate 18, height 5\' 1"  (1.549 m), weight 91.683 kg (202 lb 2 oz), last menstrual period 07/20/2012, SpO2 100.00%.  Physical Exam  Constitutional: She is oriented to person, place, and time. She appears well-developed and well-nourished. No distress.  HENT:  Head: Normocephalic.  Cardiovascular: Normal rate, regular rhythm and normal heart sounds.   Respiratory: Effort normal and breath sounds normal. No respiratory distress.  GI: Soft. Bowel sounds are normal. She exhibits no distension. There is no tenderness.  Genitourinary: Vagina normal and uterus normal. No vaginal discharge found.  Cervix closed and long  Neurological: She is alert and oriented to person, place, and time.  Skin: Skin is dry.  Psychiatric: She has a normal mood and affect. Her behavior is normal.   Results for orders placed during the hospital encounter of 02/13/13 (from the past 24 hour(s))  URINALYSIS, ROUTINE W REFLEX MICROSCOPIC     Status: Abnormal   Collection Time    02/13/13  9:20 AM      Result Value Range   Color, Urine YELLOW  YELLOW   APPearance CLEAR  CLEAR   Specific Gravity, Urine 1.020  1.005 - 1.030   pH 7.0  5.0 - 8.0   Glucose, UA NEGATIVE  NEGATIVE mg/dL   Hgb urine dipstick NEGATIVE  NEGATIVE   Bilirubin  Urine NEGATIVE  NEGATIVE   Ketones, ur NEGATIVE  NEGATIVE mg/dL   Protein, ur NEGATIVE  NEGATIVE mg/dL   Urobilinogen, UA 1.0  0.0 - 1.0 mg/dL   Nitrite NEGATIVE  NEGATIVE   Leukocytes, UA MODERATE (*) NEGATIVE  URINE MICROSCOPIC-ADD ON     Status: Abnormal   Collection Time    02/13/13  9:20 AM      Result Value Range   Squamous Epithelial / LPF FEW (*) RARE   WBC, UA 3-6  <3 WBC/hpf   Bacteria, UA FEW (*) RARE  WET PREP, GENITAL     Status: Abnormal   Collection Time    02/13/13 10:03 AM      Result Value Range   Yeast Wet Prep HPF POC NONE SEEN  NONE SEEN   Trich, Wet Prep NONE SEEN  NONE SEEN   Clue Cells  Wet Prep HPF POC NONE SEEN  NONE SEEN   WBC, Wet Prep HPF POC FEW (*) NONE SEEN    MAU Course  Procedures  MDM  Percocet/ phenergan for pain Called Dr Tamela Oddi concerning plan of care  Assessment and Plan   A: Back pain in pregnancy  P: Will start Macrobid 100 mg BID x 7 days and await urine culture Discussed maternity belts, water, rest, support hose Follow up with Dr Lilly Cove, Rubbie Battiest 02/13/2013, 11:16 AM

## 2013-02-14 LAB — GC/CHLAMYDIA PROBE AMP
CT Probe RNA: NEGATIVE
GC Probe RNA: NEGATIVE

## 2013-02-14 LAB — URINE CULTURE: Colony Count: >=100000

## 2013-02-21 ENCOUNTER — Other Ambulatory Visit (HOSPITAL_COMMUNITY): Payer: Self-pay | Admitting: Obstetrics

## 2013-02-21 DIAGNOSIS — O09299 Supervision of pregnancy with other poor reproductive or obstetric history, unspecified trimester: Secondary | ICD-10-CM

## 2013-02-24 ENCOUNTER — Inpatient Hospital Stay (HOSPITAL_COMMUNITY)
Admission: EM | Admit: 2013-02-24 | Discharge: 2013-02-27 | DRG: 781 | Disposition: A | Discharge status: Home / Self Care | Payer: Medicaid Other | Attending: Obstetrics | Admitting: Obstetrics

## 2013-02-24 ENCOUNTER — Encounter (HOSPITAL_COMMUNITY): Payer: Self-pay | Admitting: Emergency Medicine

## 2013-02-24 DIAGNOSIS — O09299 Supervision of pregnancy with other poor reproductive or obstetric history, unspecified trimester: Secondary | ICD-10-CM

## 2013-02-24 DIAGNOSIS — O2690 Pregnancy related conditions, unspecified, unspecified trimester: Secondary | ICD-10-CM

## 2013-02-24 DIAGNOSIS — N12 Tubulo-interstitial nephritis, not specified as acute or chronic: Secondary | ICD-10-CM | POA: Diagnosis present

## 2013-02-24 DIAGNOSIS — O99340 Other mental disorders complicating pregnancy, unspecified trimester: Secondary | ICD-10-CM

## 2013-02-24 LAB — COMPREHENSIVE METABOLIC PANEL
ALT: 11 U/L (ref 0–35)
AST: 12 U/L (ref 0–37)
BUN: 7 mg/dL (ref 6–23)
CO2: 24 mEq/L (ref 19–32)
Calcium: 8.7 mg/dL (ref 8.4–10.5)
Chloride: 105 mEq/L (ref 96–112)
Creatinine, Ser: 0.56 mg/dL (ref 0.50–1.10)
GFR calc Af Amer: >90 mL/min (ref >90)
GFR calc non Af Amer: >90 mL/min (ref >90)
Sodium: 139 mEq/L (ref 135–145)
Total Bilirubin: 0.2 mg/dL — ABNORMAL LOW (ref 0.3–1.2)
Total Protein: 5.8 g/dL — ABNORMAL LOW (ref 6.0–8.3)

## 2013-02-24 LAB — CBC WITH DIFFERENTIAL/PLATELET
Basophils Absolute: 0 10*3/uL (ref 0.0–0.1)
Eosinophils Absolute: 0.1 10*3/uL (ref 0.0–0.7)
Eosinophils Relative: 1 % (ref 0–5)
Lymphocytes Relative: 24 % (ref 12–46)
Lymphs Abs: 1.7 10*3/uL (ref 0.7–4.0)
MCH: 33.2 pg (ref 26.0–34.0)
MCHC: 35.1 g/dL (ref 30.0–36.0)
MCV: 94.8 fL (ref 78.0–100.0)
Monocytes Absolute: 0.7 10*3/uL (ref 0.1–1.0)
Platelets: 191 10*3/uL (ref 150–400)
RDW: 13.8 % (ref 11.5–15.5)
WBC: 6.9 10*3/uL (ref 4.0–10.5)

## 2013-02-24 LAB — URINALYSIS, ROUTINE W REFLEX MICROSCOPIC
Bilirubin Urine: NEGATIVE
Hgb urine dipstick: NEGATIVE
Ketones, ur: NEGATIVE mg/dL
Protein, ur: NEGATIVE mg/dL
Specific Gravity, Urine: 1.019 (ref 1.005–1.030)
Urobilinogen, UA: 1 mg/dL (ref 0.0–1.0)

## 2013-02-24 LAB — URINE MICROSCOPIC-ADD ON

## 2013-02-24 MED ORDER — LACTATED RINGERS IV SOLN: INTRAVENOUS | Status: DC

## 2013-02-24 MED ORDER — DEXTROSE 5 % IV SOLN
1.0000 g | Freq: Two times a day (BID) | INTRAVENOUS | Status: DC
  Administered 2013-02-25 – 2013-02-27 (×5): 1 g via INTRAVENOUS
  Filled 2013-02-24 (×6): qty 10

## 2013-02-24 MED ORDER — PRENATAL MULTIVITAMIN CH
1.0000 | ORAL_TABLET | Freq: Every day | ORAL | Status: DC
  Administered 2013-02-25 – 2013-02-27 (×3): 1 via ORAL
  Filled 2013-02-24 (×3): qty 1

## 2013-02-24 MED ORDER — ZOLPIDEM TARTRATE 5 MG PO TABS: 5.0000 mg | ORAL_TABLET | Freq: Every evening | ORAL | Status: DC | PRN

## 2013-02-24 MED ORDER — SODIUM CHLORIDE 0.9 % IV SOLN
INTRAVENOUS | Status: DC
  Administered 2013-02-25 – 2013-02-27 (×8): via INTRAVENOUS

## 2013-02-24 MED ORDER — DOCUSATE SODIUM 100 MG PO CAPS
100.0000 mg | ORAL_CAPSULE | Freq: Every day | ORAL | Status: DC
  Administered 2013-02-25 – 2013-02-27 (×3): 100 mg via ORAL
  Filled 2013-02-24 (×3): qty 1

## 2013-02-24 MED ORDER — ACETAMINOPHEN 500 MG PO TABS
1000.0000 mg | ORAL_TABLET | Freq: Once | ORAL | Status: AC
  Administered 2013-02-24: 1000 mg via ORAL
  Filled 2013-02-24: qty 2

## 2013-02-24 MED ORDER — CALCIUM CARBONATE ANTACID 500 MG PO CHEW
2.0000 | CHEWABLE_TABLET | ORAL | Status: DC | PRN
Start: 2013-02-24 — End: 2013-02-27

## 2013-02-24 MED ORDER — CEFTRIAXONE SODIUM 1 G IJ SOLR
1.0000 g | Freq: Once | INTRAMUSCULAR | Status: AC
  Administered 2013-02-24: 1 g via INTRAVENOUS
  Filled 2013-02-24: qty 10

## 2013-02-24 MED ORDER — ACETAMINOPHEN 325 MG PO TABS: 650.0000 mg | ORAL_TABLET | ORAL | Status: DC | PRN

## 2013-02-24 NOTE — Progress Notes (Signed)
RROB spoke with Dr Tamela Oddi, told of pt s/s, u/a results, hx of recent uti.  Dr Illene Bolus Physician and Dr Tamela Oddi also spoke about pt and plan of care.  Pt to be given antibiotics and be transferred to [facility]antenatal unit.  RR[facility] told of sve=1/thick/high.  RROB called and spoke with Clyda Hurdle at [facility], pt to go to rm 157.

## 2013-02-24 NOTE — Plan of Care (Signed)
Problem: Consults Goal: Birthing Suites Patient Information Press F2 to bring up selections list  Outcome: Completed/Met Date Met:  02/24/13  Pt < [redacted] weeks EGA

## 2013-02-24 NOTE — ED Notes (Addendum)
Pt placed on fetal monitor, Fetal HR 135-148, OB Rapid Response RN called.

## 2013-02-24 NOTE — ED Provider Notes (Signed)
CSN: [id number]     Arrival date & time 02/24/13  1945 History   First MD Initiated Contact with Patient 02/24/13 2000     Chief Complaint  Patient presents with  . Back Pain   (Consider location/radiation/quality/duration/timing/severity/associated sxs/prior Treatment) Patient is a 27 y.o. female presenting with back pain. The history is provided by the patient.  Back Pain Location:  Lumbar spine Quality:  Aching Radiates to: down L side of body. Pain severity:  Moderate Pain is:  Same all the time Onset quality:  Gradual Duration:  2 days Timing:  Constant Progression:  Worsening Chronicity:  Recurrent Context: recent illness (recent UTI, had this once before in another pregnancy, had UTI)   Relieved by:  Nothing Worsened by:  Nothing tried Associated symptoms: no abdominal pain and no fever     Past Medical History  Diagnosis Date  . UTI (lower urinary tract infection)   . Chlamydia   . Gonorrhea   . Trichomonas   . Abnormal Pap smear    Past Surgical History  Procedure Laterality Date  . Wisdom tooth extraction    . Colposcopy     No family history on file. History  Substance Use Topics  . Smoking status: Current Every Day Smoker -- 0.25 packs/day    Types: Cigarettes  . Smokeless tobacco: Never Used  . Alcohol Use: No     Comment: ocassionally- not since pregnancy    OB History   Grav Para Term Preterm Abortions TAB SAB Ect Mult Living   5 2 2  2 2    1      Review of Systems  Constitutional: Negative for fever.  Respiratory: Negative for cough and shortness of breath.   Gastrointestinal: Negative for nausea, vomiting, abdominal pain and diarrhea.  Musculoskeletal: Positive for back pain.  All other systems reviewed and are negative.    Allergies  Review of patient's allergies indicates no known allergies.  Home Medications   Current Outpatient Rx  Name  Route  Sig  Dispense  Refill  . nitrofurantoin, macrocrystal-monohydrate, (MACROBID) 100  MG capsule   Oral   Take 1 capsule (100 mg total) by mouth 2 (two) times daily.   14 capsule   0   . polyethylene glycol (MIRALAX) packet   Oral   Take 17 g by mouth daily.   14 each   0   . Prenatal Vit-Fe Fumarate-FA (PRENATAL MULTIVITAMIN) TABS tablet   Oral   Take 1 tablet by mouth daily at 12 noon.         . shark liver oil-cocoa butter (PREPARATION H) 0.25-3-85.5 % suppository   Rectal   Place 1 suppository rectally as needed for hemorrhoids.          BP 96/56  Pulse 90  Temp(Src) 98 F (36.7 C) (Oral)  Resp 18  Wt 206 lb 2 oz (93.498 kg)  SpO2 100%  LMP 07/20/2012 Physical Exam  Nursing note and vitals reviewed. Constitutional: She is oriented to person, place, and time. She appears well-developed and well-nourished. No distress.  HENT:  Head: Normocephalic and atraumatic.  Eyes: EOM are normal. Pupils are equal, round, and reactive to light.  Neck: Normal range of motion. Neck supple.  Cardiovascular: Normal rate and regular rhythm.  Exam reveals no friction rub.   No murmur heard. Pulmonary/Chest: Effort normal and breath sounds normal. No respiratory distress. She has no wheezes. She has no rales.  Abdominal: Soft. She exhibits no distension. There is no tenderness.  There is no rebound.  Gravid uterus  Musculoskeletal: Normal range of motion. She exhibits no edema.  Neurological: She is alert and oriented to person, place, and time.  Skin: She is not diaphoretic.    ED Course  Procedures (including critical care time) Labs Review Labs Reviewed  URINALYSIS, ROUTINE W REFLEX MICROSCOPIC - Abnormal; Notable for the following:    APPearance CLOUDY (*)    Leukocytes, UA SMALL (*)    All other components within normal limits  CBC WITH DIFFERENTIAL - Abnormal; Notable for the following:    RBC 3.25 (*)    Hemoglobin 10.8 (*)    HCT 30.8 (*)    All other components within normal limits  COMPREHENSIVE METABOLIC PANEL - Abnormal; Notable for the  following:    Total Protein 5.8 (*)    Albumin 2.6 (*)    Total Bilirubin 0.2 (*)    All other components within normal limits  URINE MICROSCOPIC-ADD ON - Abnormal; Notable for the following:    Squamous Epithelial / LPF FEW (*)    Bacteria, UA FEW (*)    All other components within normal limits  URINE CULTURE  URINALYSIS, ROUTINE W REFLEX MICROSCOPIC   Imaging Review No results found.  EKG Interpretation    Date/Time:    Ventricular Rate:    PR Interval:    QRS Duration:   QT Interval:    QTC Calculation:   R Axis:     Text Interpretation:              MDM   1. Pyelonephritis    56F presents with back pain. [id number]  At 31 weeks. Seen last week for similar. Had a UTI, cultures have not grown out sensitivities. Started on Macrobid. Patient denies fevers, N/V. Patient has no abdominal pain, no rush of fluid from her vaginal, no vaginal bleeding, no contractions. Good fetal movements.  AFVSS here. Mild L CVA tenderness. Patient's OB is Dr. Gaynell Face. Rapid response OB nurse called. Patient had trace leukocytes. With CVA tenderness and continued trace leukocytes, OB will admit for pyelonephritis. Dr. Christell Constant admitting at [facility].    Dagmar Hait, MD 02/25/13 [phone]

## 2013-02-24 NOTE — ED Notes (Signed)
The pt is 31 weeks preg and she has had lower back pain since yesterday.  edc feb 14   No vaginal bleeding .  The pt reports she knows she is not in labor that is why she came here

## 2013-02-24 NOTE — Progress Notes (Signed)
obix no longer capturing strip, paper running until pt transferred to [facility]

## 2013-02-25 ENCOUNTER — Inpatient Hospital Stay (HOSPITAL_COMMUNITY)
Admission: RE | Admit: 2013-02-25 | Discharge: 2013-02-25 | Disposition: A | Discharge status: Home / Self Care | Payer: Medicaid Other | Source: Ambulatory Visit | Attending: Obstetrics | Admitting: Obstetrics

## 2013-02-25 DIAGNOSIS — O2390 Unspecified genitourinary tract infection in pregnancy, unspecified trimester: Principal | ICD-10-CM | POA: Diagnosis present

## 2013-02-25 DIAGNOSIS — N12 Tubulo-interstitial nephritis, not specified as acute or chronic: Secondary | ICD-10-CM | POA: Diagnosis present

## 2013-02-25 DIAGNOSIS — O09299 Supervision of pregnancy with other poor reproductive or obstetric history, unspecified trimester: Secondary | ICD-10-CM

## 2013-02-25 DIAGNOSIS — M549 Dorsalgia, unspecified: Secondary | ICD-10-CM | POA: Diagnosis present

## 2013-02-25 LAB — URINALYSIS, ROUTINE W REFLEX MICROSCOPIC
Bilirubin Urine: NEGATIVE
Hgb urine dipstick: NEGATIVE
Nitrite: NEGATIVE
Specific Gravity, Urine: 1.02 (ref 1.005–1.030)
pH: 7 (ref 5.0–8.0)

## 2013-02-25 LAB — URINE MICROSCOPIC-ADD ON

## 2013-02-25 MED ORDER — OXYCODONE-ACETAMINOPHEN 5-325 MG PO TABS
1.0000 | ORAL_TABLET | ORAL | Status: DC | PRN
  Administered 2013-02-25 (×3): 2 via ORAL
  Filled 2013-02-25 (×3): qty 2

## 2013-02-25 MED ORDER — PROMETHAZINE HCL 25 MG PO TABS
25.0000 mg | ORAL_TABLET | Freq: Four times a day (QID) | ORAL | Status: DC | PRN
  Administered 2013-02-25: 25 mg via ORAL
  Filled 2013-02-25: qty 1

## 2013-02-25 NOTE — H&P (Signed)
This is Dr. Francoise Ceo dictating the history and physical on  Alicia Castro she's a 27 year old gravida 5 para 201021 at 31 weeks and 3 days to the 2013 15 who was treated for a UTI on 12 3 with Macrobid and did not take her medication as prescribed was admitted last night with Alicia Castro CVA tenderness and positive urine she is now on Rocephin 2 g of a 24 hours she is right CVA tenderness which she says feels better this morning Past medical history negative Past surgical history negative Social history negative Physical exam well-developed female in no distress HEENT negative Lungs clear to P&A Heart regular rhythm no murmurs no gallops Is right CVA tenderness none on the left Abdomen uterus 20 weeks Pelvic cervix closed Extremities negative

## 2013-02-26 LAB — URINE CULTURE: Colony Count: 60000

## 2013-02-26 NOTE — Progress Notes (Signed)
Patient ID: Alicia Castro, female   DOB: 13-Apr-1985, 27 y.o.   MRN: [id number] Vital signs normal less CVA tenderness today Continues on her antibiotics She is improving and a home tomorrow and

## 2013-02-27 ENCOUNTER — Ambulatory Visit (HOSPITAL_COMMUNITY): Payer: Medicaid Other

## 2013-02-27 ENCOUNTER — Inpatient Hospital Stay (HOSPITAL_COMMUNITY): Payer: Medicaid Other

## 2013-02-27 NOTE — Progress Notes (Signed)
Patient ID: Alicia Castro, female   DOB: [birth year], 27 y.o.   MRN: [id number] And vital signs normal CVA tenderness   less no complaints  Will  discharge   on Keflex 500 every 6 hours for 5 days to see me on 1224

## 2013-02-27 NOTE — Discharge Summary (Signed)
  Patient was admitted because of   pyelonephritis she remained afebrile and was treated with Rocephin her CVA tenderness is now resolved and she been discharged on Keflex 500 every 6 hours for 5 days to follow up with me on 03/06/2013 thank you and

## 2013-02-27 NOTE — OB Triage Note (Signed)
Patient discharged to home.  She is doing well no complaints at this time.

## 2013-02-28 ENCOUNTER — Ambulatory Visit (HOSPITAL_COMMUNITY)
Admission: RE | Admit: 2013-02-28 | Discharge: 2013-02-28 | Disposition: A | Discharge status: Home / Self Care | Payer: Medicaid Other | Source: Ambulatory Visit | Attending: Obstetrics and Gynecology | Admitting: Obstetrics and Gynecology

## 2013-02-28 ENCOUNTER — Ambulatory Visit (HOSPITAL_COMMUNITY)
Admission: RE | Admit: 2013-02-28 | Discharge: 2013-02-28 | Disposition: A | Discharge status: Home / Self Care | Payer: Medicaid Other | Source: Ambulatory Visit | Attending: Obstetrics | Admitting: Obstetrics

## 2013-02-28 ENCOUNTER — Other Ambulatory Visit (HOSPITAL_COMMUNITY): Payer: Self-pay | Admitting: Obstetrics and Gynecology

## 2013-02-28 ENCOUNTER — Other Ambulatory Visit (HOSPITAL_COMMUNITY): Payer: Medicaid Other

## 2013-02-28 DIAGNOSIS — O288 Other abnormal findings on antenatal screening of mother: Secondary | ICD-10-CM

## 2013-02-28 DIAGNOSIS — O09299 Supervision of pregnancy with other poor reproductive or obstetric history, unspecified trimester: Secondary | ICD-10-CM | POA: Insufficient documentation

## 2013-02-28 DIAGNOSIS — O99330 Smoking (tobacco) complicating pregnancy, unspecified trimester: Secondary | ICD-10-CM | POA: Insufficient documentation

## 2013-02-28 DIAGNOSIS — O289 Unspecified abnormal findings on antenatal screening of mother: Secondary | ICD-10-CM | POA: Insufficient documentation

## 2013-03-01 ENCOUNTER — Other Ambulatory Visit (HOSPITAL_COMMUNITY): Payer: Self-pay | Admitting: Obstetrics

## 2013-03-01 DIAGNOSIS — F17210 Nicotine dependence, cigarettes, uncomplicated: Secondary | ICD-10-CM

## 2013-03-01 DIAGNOSIS — O2690 Pregnancy related conditions, unspecified, unspecified trimester: Secondary | ICD-10-CM

## 2013-03-04 ENCOUNTER — Ambulatory Visit (HOSPITAL_COMMUNITY)
Admission: RE | Admit: 2013-03-04 | Discharge: 2013-03-04 | Disposition: A | Discharge status: Home / Self Care | Payer: Medicaid Other | Source: Ambulatory Visit | Attending: Obstetrics | Admitting: Obstetrics

## 2013-03-04 DIAGNOSIS — O2690 Pregnancy related conditions, unspecified, unspecified trimester: Secondary | ICD-10-CM

## 2013-03-04 DIAGNOSIS — O99330 Smoking (tobacco) complicating pregnancy, unspecified trimester: Secondary | ICD-10-CM | POA: Insufficient documentation

## 2013-03-04 DIAGNOSIS — O09299 Supervision of pregnancy with other poor reproductive or obstetric history, unspecified trimester: Secondary | ICD-10-CM | POA: Insufficient documentation

## 2013-03-04 DIAGNOSIS — F17210 Nicotine dependence, cigarettes, uncomplicated: Secondary | ICD-10-CM

## 2013-03-05 ENCOUNTER — Other Ambulatory Visit (HOSPITAL_COMMUNITY): Payer: Self-pay | Admitting: Obstetrics

## 2013-03-05 DIAGNOSIS — O2300 Infections of kidney in pregnancy, unspecified trimester: Secondary | ICD-10-CM

## 2013-03-11 ENCOUNTER — Ambulatory Visit (HOSPITAL_COMMUNITY)
Admit: 2013-03-11 | Discharge: 2013-03-11 | Disposition: A | Discharge status: Home / Self Care | Payer: Medicaid Other | Attending: Obstetrics | Admitting: Obstetrics

## 2013-03-11 DIAGNOSIS — O99330 Smoking (tobacco) complicating pregnancy, unspecified trimester: Secondary | ICD-10-CM | POA: Insufficient documentation

## 2013-03-11 DIAGNOSIS — O2300 Infections of kidney in pregnancy, unspecified trimester: Secondary | ICD-10-CM

## 2013-03-11 DIAGNOSIS — O09299 Supervision of pregnancy with other poor reproductive or obstetric history, unspecified trimester: Secondary | ICD-10-CM | POA: Insufficient documentation

## 2013-03-13 ENCOUNTER — Other Ambulatory Visit (HOSPITAL_COMMUNITY): Payer: Self-pay | Admitting: Obstetrics

## 2013-03-13 DIAGNOSIS — N12 Tubulo-interstitial nephritis, not specified as acute or chronic: Secondary | ICD-10-CM

## 2013-03-14 NOTE — L&D Delivery Note (Signed)
Delivery Note At 12:21 PM a viable female was delivered via Vaginal, Spontaneous Delivery (Presentation: Left Occiput Anterior).  APGAR: 9, 9; weight 5 lb 6.4 oz (2450 g).   Placenta status: Intact, Spontaneous.  Cord: 3 vessels with the following complications: .  Cord pH: none  Anesthesia: Epidural  Episiotomy: None Lacerations: None Suture Repair: none Est. Blood Loss (mL): 300  Mom to postpartum.  Baby to Couplet care / Skin to Skin.  Alicia Castro A 04/01/2013, 6:32 AM

## 2013-03-15 ENCOUNTER — Ambulatory Visit (HOSPITAL_COMMUNITY): Payer: Medicaid Other

## 2013-03-15 ENCOUNTER — Other Ambulatory Visit (HOSPITAL_COMMUNITY): Payer: Self-pay | Admitting: Obstetrics

## 2013-03-15 ENCOUNTER — Ambulatory Visit (HOSPITAL_COMMUNITY)
Admit: 2013-03-15 | Discharge: 2013-03-15 | Disposition: A | Discharge status: Home / Self Care | Payer: Medicaid Other | Attending: Obstetrics | Admitting: Obstetrics

## 2013-03-15 DIAGNOSIS — O09299 Supervision of pregnancy with other poor reproductive or obstetric history, unspecified trimester: Secondary | ICD-10-CM | POA: Insufficient documentation

## 2013-03-15 DIAGNOSIS — O99330 Smoking (tobacco) complicating pregnancy, unspecified trimester: Secondary | ICD-10-CM | POA: Insufficient documentation

## 2013-03-15 DIAGNOSIS — N12 Tubulo-interstitial nephritis, not specified as acute or chronic: Secondary | ICD-10-CM

## 2013-03-18 ENCOUNTER — Ambulatory Visit (HOSPITAL_COMMUNITY)
Admission: RE | Admit: 2013-03-18 | Discharge: 2013-03-18 | Disposition: A | Discharge status: Home / Self Care | Payer: Medicaid Other | Source: Ambulatory Visit | Attending: Obstetrics | Admitting: Obstetrics

## 2013-03-18 DIAGNOSIS — O99330 Smoking (tobacco) complicating pregnancy, unspecified trimester: Secondary | ICD-10-CM | POA: Insufficient documentation

## 2013-03-18 DIAGNOSIS — O09299 Supervision of pregnancy with other poor reproductive or obstetric history, unspecified trimester: Secondary | ICD-10-CM | POA: Insufficient documentation

## 2013-03-19 ENCOUNTER — Other Ambulatory Visit (HOSPITAL_COMMUNITY): Payer: Self-pay | Admitting: Obstetrics

## 2013-03-19 DIAGNOSIS — O2300 Infections of kidney in pregnancy, unspecified trimester: Secondary | ICD-10-CM

## 2013-03-21 ENCOUNTER — Ambulatory Visit (HOSPITAL_COMMUNITY)
Admission: RE | Admit: 2013-03-21 | Discharge: 2013-03-21 | Disposition: A | Discharge status: Home / Self Care | Payer: Medicaid Other | Source: Ambulatory Visit | Attending: Obstetrics | Admitting: Obstetrics

## 2013-03-21 DIAGNOSIS — O2300 Infections of kidney in pregnancy, unspecified trimester: Secondary | ICD-10-CM

## 2013-03-21 DIAGNOSIS — O99330 Smoking (tobacco) complicating pregnancy, unspecified trimester: Secondary | ICD-10-CM | POA: Insufficient documentation

## 2013-03-21 DIAGNOSIS — O09299 Supervision of pregnancy with other poor reproductive or obstetric history, unspecified trimester: Secondary | ICD-10-CM | POA: Insufficient documentation

## 2013-03-25 ENCOUNTER — Ambulatory Visit (HOSPITAL_COMMUNITY)
Admission: RE | Admit: 2013-03-25 | Discharge: 2013-03-25 | Disposition: A | Discharge status: Home / Self Care | Payer: Medicaid Other | Source: Ambulatory Visit | Attending: Obstetrics | Admitting: Obstetrics

## 2013-03-25 DIAGNOSIS — O09299 Supervision of pregnancy with other poor reproductive or obstetric history, unspecified trimester: Secondary | ICD-10-CM | POA: Insufficient documentation

## 2013-03-25 DIAGNOSIS — O99330 Smoking (tobacco) complicating pregnancy, unspecified trimester: Secondary | ICD-10-CM | POA: Insufficient documentation

## 2013-03-26 ENCOUNTER — Other Ambulatory Visit (HOSPITAL_COMMUNITY): Payer: Self-pay | Admitting: Obstetrics

## 2013-03-26 DIAGNOSIS — N12 Tubulo-interstitial nephritis, not specified as acute or chronic: Secondary | ICD-10-CM

## 2013-03-28 ENCOUNTER — Ambulatory Visit (HOSPITAL_COMMUNITY)
Admission: RE | Admit: 2013-03-28 | Discharge: 2013-03-28 | Disposition: A | Discharge status: Home / Self Care | Payer: Medicaid Other | Source: Ambulatory Visit | Attending: Obstetrics | Admitting: Obstetrics

## 2013-03-28 VITALS — BP 107/62 | HR 92 | Wt 212.5 lb

## 2013-03-28 DIAGNOSIS — O09299 Supervision of pregnancy with other poor reproductive or obstetric history, unspecified trimester: Secondary | ICD-10-CM | POA: Insufficient documentation

## 2013-03-28 DIAGNOSIS — O99330 Smoking (tobacco) complicating pregnancy, unspecified trimester: Secondary | ICD-10-CM | POA: Insufficient documentation

## 2013-03-28 DIAGNOSIS — N12 Tubulo-interstitial nephritis, not specified as acute or chronic: Secondary | ICD-10-CM

## 2013-03-28 NOTE — Progress Notes (Signed)
Alicia Castro  was seen today for an ultrasound appointment.  See full report in [facility].  Impression: Single living intrauterine pregnancy at 35 weeks 6 days. Normal amniotic fluid volume. AFI 9.6 cm Reassuring modified BPP.  Recommendations: Continue 2x weekly NSTs with weekly AFIs If testing remains reassuring, recommend delivery at 39 weeks due to prior history of 31 week IUFD.  Alicia GulaPaul Sydny Schnitzler, MD

## 2013-03-30 ENCOUNTER — Inpatient Hospital Stay (HOSPITAL_COMMUNITY)
Admission: AD | Admit: 2013-03-30 | Discharge: 2013-04-02 | DRG: 775 | Disposition: A | Discharge status: Home / Self Care | Payer: Medicaid Other | Source: Ambulatory Visit | Attending: Obstetrics | Admitting: Obstetrics

## 2013-03-30 ENCOUNTER — Encounter (HOSPITAL_COMMUNITY): Payer: Self-pay

## 2013-03-30 DIAGNOSIS — Z3490 Encounter for supervision of normal pregnancy, unspecified, unspecified trimester: Secondary | ICD-10-CM

## 2013-03-30 DIAGNOSIS — O99334 Smoking (tobacco) complicating childbirth: Principal | ICD-10-CM | POA: Diagnosis present

## 2013-03-30 DIAGNOSIS — F1210 Cannabis abuse, uncomplicated: Secondary | ICD-10-CM | POA: Diagnosis present

## 2013-03-30 DIAGNOSIS — O99344 Other mental disorders complicating childbirth: Secondary | ICD-10-CM | POA: Diagnosis present

## 2013-03-30 LAB — CBC
HCT: 34.1 % — ABNORMAL LOW (ref 36.0–46.0)
Hemoglobin: 11.5 g/dL — ABNORMAL LOW (ref 12.0–15.0)
MCH: 31.9 pg (ref 26.0–34.0)
MCHC: 33.7 g/dL (ref 30.0–36.0)
MCV: 94.5 fL (ref 78.0–100.0)
Platelets: 173 10*3/uL (ref 150–400)
RBC: 3.61 MIL/uL — ABNORMAL LOW (ref 3.87–5.11)
RDW: 14 % (ref 11.5–15.5)
WBC: 8 10*3/uL (ref 4.0–10.5)

## 2013-03-30 LAB — RPR: RPR Ser Ql: NONREACTIVE

## 2013-03-30 MED ORDER — NALBUPHINE HCL 10 MG/ML IJ SOLN
10.0000 mg | Freq: Four times a day (QID) | INTRAMUSCULAR | Status: DC | PRN
  Filled 2013-03-30: qty 1

## 2013-03-30 MED ORDER — OXYCODONE-ACETAMINOPHEN 5-325 MG PO TABS: 1.0000 | ORAL_TABLET | ORAL | Status: DC | PRN

## 2013-03-30 MED ORDER — ACETAMINOPHEN 325 MG PO TABS: 650.0000 mg | ORAL_TABLET | ORAL | Status: DC | PRN

## 2013-03-30 MED ORDER — OXYTOCIN 40 UNITS IN LACTATED RINGERS INFUSION - SIMPLE MED: 62.5000 mL/h | INTRAVENOUS | Status: DC

## 2013-03-30 MED ORDER — CITRIC ACID-SODIUM CITRATE 334-500 MG/5ML PO SOLN: 30.0000 mL | ORAL | Status: DC | PRN

## 2013-03-30 MED ORDER — FLEET ENEMA 7-19 GM/118ML RE ENEM: 1.0000 | ENEMA | RECTAL | Status: DC | PRN

## 2013-03-30 MED ORDER — ONDANSETRON HCL 4 MG/2ML IJ SOLN: 4.0000 mg | Freq: Four times a day (QID) | INTRAMUSCULAR | Status: DC | PRN

## 2013-03-30 MED ORDER — TERBUTALINE SULFATE 1 MG/ML IJ SOLN: 0.2500 mg | Freq: Once | INTRAMUSCULAR | Status: AC | PRN

## 2013-03-30 MED ORDER — NALBUPHINE HCL 10 MG/ML IJ SOLN
10.0000 mg | INTRAMUSCULAR | Status: DC | PRN
  Administered 2013-03-31: 10 mg via INTRAVENOUS
  Filled 2013-03-30 (×2): qty 1

## 2013-03-30 MED ORDER — IBUPROFEN 600 MG PO TABS: 600.0000 mg | ORAL_TABLET | Freq: Four times a day (QID) | ORAL | Status: DC | PRN

## 2013-03-30 MED ORDER — OXYTOCIN 40 UNITS IN LACTATED RINGERS INFUSION - SIMPLE MED
1.0000 m[IU]/min | INTRAVENOUS | Status: DC
  Administered 2013-03-30: 2 m[IU]/min via INTRAVENOUS
  Filled 2013-03-30: qty 1000

## 2013-03-30 MED ORDER — PENICILLIN G POTASSIUM 5000000 UNITS IJ SOLR
5.0000 10*6.[IU] | Freq: Once | INTRAVENOUS | Status: AC
  Administered 2013-03-30: 5 10*6.[IU] via INTRAVENOUS
  Filled 2013-03-30: qty 5

## 2013-03-30 MED ORDER — OXYTOCIN BOLUS FROM INFUSION: 500.0000 mL | INTRAVENOUS | Status: DC

## 2013-03-30 MED ORDER — PROMETHAZINE HCL 25 MG/ML IJ SOLN: 25.0000 mg | Freq: Four times a day (QID) | INTRAMUSCULAR | Status: DC | PRN

## 2013-03-30 MED ORDER — LIDOCAINE HCL (PF) 1 % IJ SOLN
30.0000 mL | INTRAMUSCULAR | Status: DC | PRN
  Filled 2013-03-30 (×2): qty 30

## 2013-03-30 MED ORDER — LACTATED RINGERS IV SOLN
INTRAVENOUS | Status: DC
  Administered 2013-03-30: 125 mL/h via INTRAVENOUS

## 2013-03-30 MED ORDER — PENICILLIN G POTASSIUM 5000000 UNITS IJ SOLR
2.5000 10*6.[IU] | INTRAMUSCULAR | Status: DC
  Administered 2013-03-30 – 2013-03-31 (×4): 2.5 10*6.[IU] via INTRAVENOUS
  Filled 2013-03-30 (×9): qty 2.5

## 2013-03-30 MED ORDER — LACTATED RINGERS IV SOLN: 500.0000 mL | INTRAVENOUS | Status: DC | PRN

## 2013-03-30 NOTE — Progress Notes (Signed)
Bernie Coveyamara L Maimone is a 28 y.o. [phone] at 6715w1d by LMP admitted for rupture of membranes  Subjective:   Objective: BP 98/61  Pulse 85  Temp(Src) 97.9 F (36.6 C) (Oral)  Resp 18  Ht 5\' 5"  (1.651 m)  Wt 210 lb (95.255 kg)  BMI 34.95 kg/m2  LMP 07/20/2012      FHT:  FHR: 130 bpm, variability: moderate,  accelerations:  Present,  decelerations:  Absent UC:   irregular, every 2-7 minutes SVE:   Dilation: 2 Effacement (%): 60 Station: -2 Exam by:: Valentina Lucks. Woods, RN  Labs: Lab Results  Component Value Date   WBC 8.0 03/30/2013   HGB 11.5* 03/30/2013   HCT 34.1* 03/30/2013   MCV 94.5 03/30/2013   PLT 173 03/30/2013    Assessment / Plan: 36 weeks.  SROM.  Latent phase of labor.  Pitocin augmentation.  Labor: Latent phase Preeclampsia:  n/a Fetal Wellbeing:  Category I Pain Control:  Labor support without medications I/D:  n/a Anticipated MOD:  NSVD  [name],[name] 03/30/2013, 9:01 PM

## 2013-03-30 NOTE — MAU Note (Signed)
Pt presents complaining of possible ROM since this am with contractions. Denies vaginal bleeding or discharge.

## 2013-03-30 NOTE — H&P (Signed)
Alicia Castro is a 28 y.o. female presenting for srom. Maternal Medical History:  Reason for admission: Rupture of membranes and contractions.  28 yo G5 P2021,  EDC 04-26-13.  Fetal activity: Perceived fetal activity is normal.   Last perceived fetal movement was within the past hour.    Prenatal complications: no prenatal complications Prenatal Complications - Diabetes: none.    OB History   Grav Para Term Preterm Abortions TAB SAB Ect Mult Living   5 2 2  2 2    1      Past Medical History  Diagnosis Date  . UTI (lower urinary tract infection)   . Chlamydia   . Gonorrhea   . Trichomonas   . Abnormal Pap smear    Past Surgical History  Procedure Laterality Date  . Wisdom tooth extraction    . Colposcopy     Family History: family history includes Cancer in her maternal grandmother. Social History:  reports that she has been smoking Cigarettes.  She has been smoking about 0.25 packs per day. She has never used smokeless tobacco. She reports that she uses illicit drugs (Marijuana) about 7 times per week. She reports that she does not drink alcohol.   Prenatal Transfer Tool  Maternal Diabetes: No Genetic Screening: Normal Maternal Ultrasounds/Referrals: Normal Fetal Ultrasounds or other Referrals:  None Maternal Substance Abuse:  No Significant Maternal Medications:  None Significant Maternal Lab Results:  None Other Comments:  None  Review of Systems  All other systems reviewed and are negative.    Dilation: 1.5 Effacement (%): 60 Station: -3 Exam by:: Ginger Morris RN Blood pressure 113/59, pulse 91, temperature 98.2 F (36.8 C), temperature source Oral, resp. rate 18, last menstrual period 07/20/2012. Maternal Exam:  Uterine Assessment: Contraction strength is mild.  Abdomen: Patient reports no abdominal tenderness. Fetal presentation: vertex  Introitus: Normal vulva. Normal vagina.  Pelvis: adequate for delivery.   Cervix: Cervix evaluated by digital  exam.     Physical Exam  Nursing note and vitals reviewed. Constitutional: She is oriented to person, place, and time. She appears well-developed and well-nourished.  HENT:  Head: Normocephalic and atraumatic.  Eyes: Conjunctivae are normal. Pupils are equal, round, and reactive to light.  Neck: Normal range of motion. Neck supple.  Cardiovascular: Normal rate and regular rhythm.   Respiratory: Effort normal and breath sounds normal.  GI: Soft.  Genitourinary: Vagina normal and uterus normal.  Musculoskeletal: Normal range of motion.  Neurological: She is alert and oriented to person, place, and time.  Skin: Skin is warm and dry.  Psychiatric: She has a normal mood and affect. Her behavior is normal. Judgment and thought content normal.    Prenatal labs: ABO, Rh:   Antibody:   Rubella:   RPR: NON REACTIVE (02/23 1324)  HBsAg:    HIV: NON REACTIVE (02/23 1324)  GBS:     Assessment/Plan: 36 weeks.  SROM,  Admit.   [name],[name] 03/30/2013, 2:26 PM

## 2013-03-31 ENCOUNTER — Encounter (HOSPITAL_COMMUNITY): Payer: Self-pay | Admitting: *Deleted

## 2013-03-31 ENCOUNTER — Encounter (HOSPITAL_COMMUNITY): Payer: Medicaid Other | Admitting: Anesthesiology

## 2013-03-31 ENCOUNTER — Inpatient Hospital Stay (HOSPITAL_COMMUNITY): Payer: Medicaid Other | Admitting: Anesthesiology

## 2013-03-31 LAB — ABO/RH: ABO/RH(D): O POS

## 2013-03-31 MED ORDER — BENZOCAINE-MENTHOL 20-0.5 % EX AERO: 1.0000 "application " | INHALATION_SPRAY | CUTANEOUS | Status: DC | PRN

## 2013-03-31 MED ORDER — OXYTOCIN 40 UNITS IN LACTATED RINGERS INFUSION - SIMPLE MED: 62.5000 mL/h | INTRAVENOUS | Status: DC | PRN

## 2013-03-31 MED ORDER — DIBUCAINE 1 % RE OINT: 1.0000 "application " | TOPICAL_OINTMENT | RECTAL | Status: DC | PRN

## 2013-03-31 MED ORDER — OXYCODONE-ACETAMINOPHEN 5-325 MG PO TABS
1.0000 | ORAL_TABLET | ORAL | Status: DC | PRN
  Administered 2013-03-31: 1 via ORAL
  Filled 2013-03-31: qty 1

## 2013-03-31 MED ORDER — SENNOSIDES-DOCUSATE SODIUM 8.6-50 MG PO TABS
2.0000 | ORAL_TABLET | ORAL | Status: DC
  Administered 2013-04-01: 2 via ORAL
  Filled 2013-03-31 (×2): qty 1
  Filled 2013-03-31: qty 2

## 2013-03-31 MED ORDER — TETANUS-DIPHTH-ACELL PERTUSSIS 5-2.5-18.5 LF-MCG/0.5 IM SUSP
0.5000 mL | Freq: Once | INTRAMUSCULAR | Status: DC
Start: 2013-04-01 — End: 2013-04-01

## 2013-03-31 MED ORDER — PHENYLEPHRINE 40 MCG/ML (10ML) SYRINGE FOR IV PUSH (FOR BLOOD PRESSURE SUPPORT)
80.0000 ug | PREFILLED_SYRINGE | INTRAVENOUS | Status: DC | PRN
  Filled 2013-03-31: qty 2
  Filled 2013-03-31: qty 10

## 2013-03-31 MED ORDER — FENTANYL 2.5 MCG/ML BUPIVACAINE 1/10 % EPIDURAL INFUSION (WH - ANES)
14.0000 mL/h | INTRAMUSCULAR | Status: DC | PRN
  Administered 2013-03-31: 14 mL/h via EPIDURAL
  Filled 2013-03-31: qty 125

## 2013-03-31 MED ORDER — EPHEDRINE 5 MG/ML INJ
10.0000 mg | INTRAVENOUS | Status: DC | PRN
  Filled 2013-03-31: qty 4
  Filled 2013-03-31: qty 2

## 2013-03-31 MED ORDER — ONDANSETRON HCL 4 MG/2ML IJ SOLN: 4.0000 mg | INTRAMUSCULAR | Status: DC | PRN

## 2013-03-31 MED ORDER — DIPHENHYDRAMINE HCL 25 MG PO CAPS: 25.0000 mg | ORAL_CAPSULE | Freq: Four times a day (QID) | ORAL | Status: DC | PRN

## 2013-03-31 MED ORDER — SODIUM BICARBONATE 8.4 % IV SOLN
INTRAVENOUS | Status: DC | PRN
  Administered 2013-03-31: 5 mL via EPIDURAL

## 2013-03-31 MED ORDER — MEDROXYPROGESTERONE ACETATE 150 MG/ML IM SUSP: 150.0000 mg | INTRAMUSCULAR | Status: DC | PRN

## 2013-03-31 MED ORDER — PRENATAL MULTIVITAMIN CH
1.0000 | ORAL_TABLET | Freq: Every day | ORAL | Status: DC
  Filled 2013-03-31: qty 1

## 2013-03-31 MED ORDER — LANOLIN HYDROUS EX OINT: TOPICAL_OINTMENT | CUTANEOUS | Status: DC | PRN

## 2013-03-31 MED ORDER — ONDANSETRON HCL 4 MG PO TABS: 4.0000 mg | ORAL_TABLET | ORAL | Status: DC | PRN

## 2013-03-31 MED ORDER — SIMETHICONE 80 MG PO CHEW: 80.0000 mg | CHEWABLE_TABLET | ORAL | Status: DC | PRN

## 2013-03-31 MED ORDER — ZOLPIDEM TARTRATE 5 MG PO TABS: 5.0000 mg | ORAL_TABLET | Freq: Every evening | ORAL | Status: DC | PRN

## 2013-03-31 MED ORDER — PHENYLEPHRINE 40 MCG/ML (10ML) SYRINGE FOR IV PUSH (FOR BLOOD PRESSURE SUPPORT)
80.0000 ug | PREFILLED_SYRINGE | INTRAVENOUS | Status: DC | PRN
  Filled 2013-03-31: qty 2

## 2013-03-31 MED ORDER — EPHEDRINE 5 MG/ML INJ
10.0000 mg | INTRAVENOUS | Status: DC | PRN
  Filled 2013-03-31: qty 2

## 2013-03-31 MED ORDER — WITCH HAZEL-GLYCERIN EX PADS: 1.0000 "application " | MEDICATED_PAD | CUTANEOUS | Status: DC | PRN

## 2013-03-31 MED ORDER — DIPHENHYDRAMINE HCL 50 MG/ML IJ SOLN: 12.5000 mg | INTRAMUSCULAR | Status: DC | PRN

## 2013-03-31 MED ORDER — LACTATED RINGERS IV SOLN
500.0000 mL | Freq: Once | INTRAVENOUS | Status: AC
  Administered 2013-03-31: 500 mL via INTRAVENOUS

## 2013-03-31 MED ORDER — IBUPROFEN 600 MG PO TABS
600.0000 mg | ORAL_TABLET | Freq: Four times a day (QID) | ORAL | Status: DC
  Administered 2013-03-31 – 2013-04-02 (×6): 600 mg via ORAL
  Filled 2013-03-31 (×7): qty 1

## 2013-03-31 NOTE — Anesthesia Procedure Notes (Signed)
Epidural Patient location during procedure: OB  Preanesthetic Checklist Completed: patient identified, site marked, surgical consent, pre-op evaluation, timeout performed, IV checked, risks and benefits discussed and monitors and equipment checked  Epidural Patient position: sitting Prep: site prepped and draped and DuraPrep Patient monitoring: continuous pulse ox and blood pressure Approach: midline Injection technique: LOR air  Needle:  Needle type: Tuohy  Needle gauge: 17 G Needle length: 9 cm and 9 Needle insertion depth: 5 cm cm Catheter type: closed end flexible Catheter size: 19 Gauge Catheter at skin depth: 11 cm Test dose: negative  Assessment Events: blood not aspirated, injection not painful, no injection resistance, negative IV test and no paresthesia  Additional Notes Dosing of Epidural:  1st dose, through catheter ............................................. epi 1:200K + Xylocaine 40 mg  2nd dose, through catheter, after waiting 3 minutes.....epi 1:200K + Xylocaine 60 mg    ( 2% Xylo charted as a single dose in Epic Meds for ease of charting; actual dosing was fractionated as above, for saftey's sake)  As each dose occurred, patient was free of IV sx; and patient exhibited no evidence of SA injection.  Patient is more comfortable after epidural dosed. Please see RN's note for documentation of vital signs,and FHR which are stable.  Patient reminded not to try to ambulate with numb legs, and that an RN must be present when she attempts to get up.       

## 2013-03-31 NOTE — Progress Notes (Signed)
Dr. Clearance CootsHarper called back after reviewing strip online. Satisfied with baseline, would like to go back to external monitoring due to artifact on FSE. External reapplied. Reported patient history to Dr. Clearance CootsHarper, patient has decided on epidural and is experiencing a good deal of pain with contractions.

## 2013-03-31 NOTE — Progress Notes (Signed)
Alicia Castro is a 28 y.o. [phone] at 742w2d by LMP admitted for rupture of membranes  Subjective:   Objective: BP 107/51  Pulse 105  Temp(Src) 97.6 F (36.4 C) (Oral)  Resp 18  Ht 5\' 5"  (1.651 m)  Wt 210 lb (95.255 kg)  BMI 34.95 kg/m2  SpO2 99%  LMP 07/20/2012   Total I/O In: -  Out: 450 [Urine:150; Blood:300]  FHT:  FHR: 130 bpm, variability: moderate,  accelerations:  Present,  decelerations:  Absent UC:   regular, every 3 minutes SVE:   Dilation: 10 Effacement (%): 90 Station: +2 Exam by:: ConAgra FoodsSmith  Labs: Lab Results  Component Value Date   WBC 8.0 03/30/2013   HGB 11.5* 03/30/2013   HCT 34.1* 03/30/2013   MCV 94.5 03/30/2013   PLT 173 03/30/2013    Assessment / Plan: Augmentation of labor, progressing well  Labor: Progressing normally Preeclampsia:  n/a Fetal Wellbeing:  Category I Pain Control:  Epidural I/D:  n/a Anticipated MOD:  NSVD  [name],[name] 03/31/2013, 2:11 PM

## 2013-03-31 NOTE — Lactation Note (Signed)
This note was copied from the chart of Alicia Claire Shownamara Covello. Lactation Consultation Note  Patient Name: Alicia Castro ONGEX'BToday's Date: 03/31/2013 Reason for consult: Initial assessment;Other (Comment) (charting for exclusion)   Maternal Data Formula Feeding for Exclusion: Yes Reason for exclusion: Mother's choice to formula feed on admision  Feeding Feeding Type: Bottle Fed - Formula Nipple Type: Regular  LATCH Score/Interventions                      Lactation Tools Discussed/Used     Consult Status Consult Status: Complete    Lynda RainwaterBryant, Gavina Dildine Parmly 03/31/2013, 3:38 PM

## 2013-03-31 NOTE — Anesthesia Preprocedure Evaluation (Signed)
Anesthesia Evaluation  Patient identified by MRN, date of birth, ID band Patient awake    Reviewed: Allergy & Precautions, H&P , Patient's Chart, lab work & pertinent test results  Airway Mallampati: II  TM Distance: >3 FB Neck ROM: full    Dental  (+) Teeth Intact   Pulmonary Current Smoker,    breath sounds clear to auscultation       Cardiovascular  Rhythm:regular Rate:Normal     Neuro/Psych    GI/Hepatic   Endo/Other  Morbid obesity  Renal/GU      Musculoskeletal   Abdominal   Peds  Hematology   Anesthesia Other Findings       Reproductive/Obstetrics (+) Pregnancy                             Anesthesia Physical Anesthesia Plan  ASA: III  Anesthesia Plan: Epidural   Post-op Pain Management:    Induction:   Airway Management Planned:   Additional Equipment:   Intra-op Plan:   Post-operative Plan:   Informed Consent: I have reviewed the patients History and Physical, chart, labs and discussed the procedure including the risks, benefits and alternatives for the proposed anesthesia with the patient or authorized representative who has indicated his/her understanding and acceptance.   Dental Advisory Given  Plan Discussed with:   Anesthesia Plan Comments: (Labs checked- platelets confirmed with RN in room. Fetal heart tracing, per RN, reported to be stable enough for sitting procedure. Discussed epidural, and patient consents to the procedure:  included risk of possible headache,backache, failed block, allergic reaction, and nerve injury. This patient was asked if she had any questions or concerns before the procedure started.)        Anesthesia Quick Evaluation  

## 2013-04-01 LAB — CBC
HCT: 29.3 % — ABNORMAL LOW (ref 36.0–46.0)
Hemoglobin: 10 g/dL — ABNORMAL LOW (ref 12.0–15.0)
MCH: 31.8 pg (ref 26.0–34.0)
MCHC: 34.1 g/dL (ref 30.0–36.0)
MCV: 93.3 fL (ref 78.0–100.0)
Platelets: 155 10*3/uL (ref 150–400)
RBC: 3.14 MIL/uL — AB (ref 3.87–5.11)
RDW: 14 % (ref 11.5–15.5)
WBC: 9.1 10*3/uL (ref 4.0–10.5)

## 2013-04-01 NOTE — Anesthesia Postprocedure Evaluation (Signed)
Anesthesia Post Note  Patient: Alicia Castro  Procedure(s) Performed: * No procedures listed *  Anesthesia type: Epidural  Patient location: Mother/Baby  Post pain: Pain level controlled  Post assessment: Post-op Vital signs reviewed  Last Vitals:  Filed Vitals:   04/01/13 0531  BP: 104/68  Pulse: 74  Temp: 36.8 C  Resp: 18    Post vital signs: Reviewed  Level of consciousness: awake  Complications: No apparent anesthesia complications

## 2013-04-01 NOTE — Progress Notes (Signed)
Post Partum Day 1 Subjective: no complaints  Objective: Blood pressure 104/68, pulse 74, temperature 98.2 F (36.8 C), temperature source Oral, resp. rate 18, height 5\' 5"  (1.651 m), weight 210 lb (95.255 kg), last menstrual period 07/20/2012, SpO2 100.00%, unknown if currently breastfeeding.  Physical Exam:  General: alert and no distress Lochia: appropriate Uterine Fundus: firm Incision: none DVT Evaluation: No evidence of DVT seen on physical exam.   Recent Labs  03/30/13 1500  HGB 11.5*  HCT 34.1*    Assessment/Plan: Plan for discharge tomorrow   LOS: 2 days   [name],[name] 04/01/2013, 6:35 AM

## 2013-04-01 NOTE — Progress Notes (Signed)
UR chart review completed.  

## 2013-04-01 NOTE — Progress Notes (Signed)
Clinical Social Work Department PSYCHOSOCIAL ASSESSMENT - MATERNAL/CHILD 04/01/2013  Patient:  Alicia Castro, Alicia Castro  Account Number:  [id number]  [place] Date:  03/30/2013  Ardine Eng Name:   Roosvelt Harps    Clinical Social Worker:  Terri Piedra, LCSW   Date/Time:  04/01/2013 10:30 AM  Date Referred:  04/01/2013   Referral source  CN     Referred reason  Substance Abuse   Other referral source:    I:  FAMILY / [place] legal guardian:  PARENT  Guardian - Name Guardian - Age Guardian - Address  [address], [place], [place] [address]  Stevie Hill     Other household support members/support persons Name Relationship DOB  Tahjae Chambers SON 9   Other support:   MOB states she has a great support system.  She had a visitor here with her today who arrived at the same time CSW did.  CSW explained to [facility] privately what we would be discussing and she stated we could talk about anything with her visitor present.    II  PSYCHOSOCIAL DATA Information Source:  Patient Interview  Occupational hygienist Employment:   Financial resources:  [employer] If Medicaid - [employer]:  [place] Other  [place] / Grade:   Maternity Care Coordinator / Child Services Coordination / Early Interventions:  Cultural issues impacting care:   None stated    III  STRENGTHS Strengths  Adequate Resources  Compliance with medical plan  Other - See comment  Supportive family/friends   Strength comment:  Pediatric follow up will be at [facility].   IV  RISK FACTORS AND CURRENT PROBLEMS Current Problem:  YES   Risk Factor & Current Problem Patient Issue Family Issue Risk Factor / Current Problem Comment  Substance Abuse Y N Marijuana use   N N     V  SOCIAL WORK ASSESSMENT  CSW met with MOB in her first floor room/120 to complete assessment for marijuana use.  MOB was fairly pleasant and welcomed CSW into the room.  She was  feeding baby a bottle when CSW arrived and bonding was evident.  MOB states she and baby are doing well, however, she was not prepared for baby to be born yesterday, as she was 4 weeks early.  CSW asked her about baby supplies at home since baby was early.  MOB states she does not have baby items, but her family is out today getting everything she needs.  She reports having a great support system.  When CSW asked about FOB, MOB seemed to not know how to define their relationship.  She later referred to him as her boyfriend.  She describes the relationship as good and that he is involved and supportive, but that they both want a paternity test.  MOB has one other child, a 59 year old son named Alicia Castro, who is currently being cared for by [facility].  MOB states his father is somewhat involved.  She states it is hard for her son to not be allowed in the hospital, but she is understanding.  CSW asked how MOB is feeling emotionally at this time and whether or not she experienced any PPD symptoms after her first child.  She states she is feeling well and denies previous symptoms.  CSW discussed what is normal after delivery as well as signs and symptoms of PPD to be aware of.  MOB commits to call her doctor if symptoms arise.  She states no  emotional concerns at this time.  CSW asked MOB about the hx of marijuana use noted in her chart.  MOB admits to smoking 2x per day during pregnancy.  She states she smoked more often prior to pregnancy and when she tried to wean her use after becoming pregnant, she felt signs of withdrawal.  She states she was nauseous and stated that she could not cut back any more than 2x per day.  CSW asked what her plan is at this time and MOB states she does not plan to use.  CSW recommends substance abuse counseling.  MOB states she is "not mentally or emotionally addicted."  She denies need for counseling.  She states she feels that now that she is not pregnant she will not have the physical  withdrawal signs/nausea she had when she weaned before.  She states since she hasn't used while she has been in the hospital, she does not feel like she will need to use once she gets home.  CSW informed her of hospital drug screen policy and mandated report to [employer].  MOB was understanding and appeared unconcerned.  CSW asked if she has had CPS involvement in the past with her son.  She states when he was very little, he figured out how to open the door and got out of the house.  She states nothing came of this with CPS.  She then states 2-3 years ago she got a possession of marijuana charge and her son was with her, so CPS got involved.  She states no removal of custody.  She understands a report will be made by CSW today.  CSW asked if she has any questions or needs that CSW can assist with.  She denies and thanked CSW.  CSW made report to the [facility] After Hours line as it is a holiday.  CSW is unsure if a worker will meet with MOB prior to her discharge and will follow up with CPS in the morning to discuss their plan.      VI SOCIAL WORK PLAN Social Work Plan  Child Scientist, forensic Report  Patient/Family Education   Type of pt/family education:   PPD signs and symptoms  Hospital drug screen policy/CPS report due to baby's positive UDS.   If child protective services report - county: [place] If child protective services report - date: 04/01/13 Information/referral to community resources comment:   CSW offered to make referral for substance abuse counseling, but MOB was not interested.   Other social work plan: CSW will monitor MDS and report results to [place] worker.

## 2013-04-02 ENCOUNTER — Ambulatory Visit (HOSPITAL_COMMUNITY): Payer: No Typology Code available for payment source

## 2013-04-02 MED ORDER — OXYCODONE-ACETAMINOPHEN 5-325 MG PO TABS: 1.0000 | ORAL_TABLET | ORAL | Status: DC | PRN

## 2013-04-02 MED ORDER — IBUPROFEN 600 MG PO TABS: 600.0000 mg | ORAL_TABLET | Freq: Four times a day (QID) | ORAL | Status: DC | PRN

## 2013-04-02 MED ORDER — FUSION PLUS PO CAPS: 1.0000 | ORAL_CAPSULE | Freq: Every day | ORAL | Status: DC

## 2013-04-02 NOTE — Discharge Instructions (Signed)
Discharge instructions ° °· You can wash your hair °· Shower °· Eat what you want °· Drink what you want °· See me in 6 weeks °· Your ankles are going to swell more in the next 2 weeks than when pregnant °· No sex for 6 weeks ° ° °Susana Duell A, MD 04/02/2013 ° ° °

## 2013-04-02 NOTE — Progress Notes (Signed)
Patient ID: Alicia Castro, female   DOB: [birth year], 28 y.o.   MRN: [id number] Postpartum day 2 Vital signs normal Fundus firm No complaints home today

## 2013-04-02 NOTE — Discharge Summary (Signed)
Obstetric Discharge Summary Reason for Admission: onset of labor Prenatal Procedures: none Intrapartum Procedures: spontaneous vaginal delivery Postpartum Procedures: none Complications-Operative and Postpartum: none Hemoglobin  Date Value Range Status  04/01/2013 10.0* 12.0 - 15.0 g/dL Final     HCT  Date Value Range Status  04/01/2013 29.3* 36.0 - 46.0 % Final    Physical Exam:  General: alert Lochia: appropriate Uterine Fundus: firm Incision: healing well DVT Evaluation: No evidence of DVT seen on physical exam.  Discharge Diagnoses: Term Pregnancy-delivered  Discharge Information: Date: 04/02/2013 Activity: unrestricted Diet: routine Medications: Percocet Condition: stable Instructions: refer to practice specific booklet Discharge to: home Follow-up Information   Follow up with Kathreen CosierMARSHALL,Deysha Cartier A, MD.   Specialty:  Obstetrics and Gynecology   Contact information:   [address] [place] [address] [phone]       Newborn Data: Live born female  Birth Weight: 5 lb 6.4 oz (2450 g) APGAR: 9, 9  Home with mother.  Amamda Curbow A 04/02/2013, 6:58 AM

## 2013-04-05 ENCOUNTER — Ambulatory Visit (HOSPITAL_COMMUNITY): Payer: No Typology Code available for payment source

## 2013-04-05 ENCOUNTER — Other Ambulatory Visit (HOSPITAL_COMMUNITY): Payer: Medicaid Other

## 2013-04-08 ENCOUNTER — Encounter (HOSPITAL_COMMUNITY): Payer: Self-pay | Admitting: Emergency Medicine

## 2013-04-08 ENCOUNTER — Ambulatory Visit (HOSPITAL_COMMUNITY): Payer: No Typology Code available for payment source

## 2013-04-08 ENCOUNTER — Inpatient Hospital Stay (HOSPITAL_COMMUNITY)
Admission: EM | Admit: 2013-04-08 | Discharge: 2013-04-11 | DRG: 776 | Disposition: A | Discharge status: Home / Self Care | Payer: Medicaid Other | Attending: Obstetrics & Gynecology | Admitting: Obstetrics & Gynecology

## 2013-04-08 ENCOUNTER — Emergency Department (HOSPITAL_COMMUNITY): Payer: Medicaid Other

## 2013-04-08 DIAGNOSIS — O9089 Other complications of the puerperium, not elsewhere classified: Secondary | ICD-10-CM

## 2013-04-08 DIAGNOSIS — O99335 Smoking (tobacco) complicating the puerperium: Secondary | ICD-10-CM | POA: Diagnosis present

## 2013-04-08 DIAGNOSIS — I6783 Posterior reversible encephalopathy syndrome: Secondary | ICD-10-CM | POA: Diagnosis present

## 2013-04-08 DIAGNOSIS — R51 Headache: Secondary | ICD-10-CM

## 2013-04-08 DIAGNOSIS — R519 Headache, unspecified: Secondary | ICD-10-CM | POA: Diagnosis present

## 2013-04-08 DIAGNOSIS — F1210 Cannabis abuse, uncomplicated: Secondary | ICD-10-CM | POA: Diagnosis present

## 2013-04-08 DIAGNOSIS — O152 Eclampsia in the puerperium: Principal | ICD-10-CM | POA: Diagnosis present

## 2013-04-08 DIAGNOSIS — G9349 Other encephalopathy: Secondary | ICD-10-CM

## 2013-04-08 DIAGNOSIS — R52 Pain, unspecified: Secondary | ICD-10-CM

## 2013-04-08 DIAGNOSIS — O99345 Other mental disorders complicating the puerperium: Secondary | ICD-10-CM | POA: Diagnosis present

## 2013-04-08 LAB — CBC
HEMATOCRIT: 39.9 % (ref 36.0–46.0)
Hemoglobin: 13.7 g/dL (ref 12.0–15.0)
MCH: 32.4 pg (ref 26.0–34.0)
MCHC: 34.3 g/dL (ref 30.0–36.0)
MCV: 94.3 fL (ref 78.0–100.0)
PLATELETS: 292 10*3/uL (ref 150–400)
RBC: 4.23 MIL/uL (ref 3.87–5.11)
RDW: 13.6 % (ref 11.5–15.5)
WBC: 5 10*3/uL (ref 4.0–10.5)

## 2013-04-08 LAB — COMPREHENSIVE METABOLIC PANEL
ALBUMIN: 3.5 g/dL (ref 3.5–5.2)
ALK PHOS: 107 U/L (ref 39–117)
ALT: 42 U/L — ABNORMAL HIGH (ref 0–35)
AST: 31 U/L (ref 0–37)
BUN: 11 mg/dL (ref 6–23)
CO2: 22 mEq/L (ref 19–32)
CREATININE: 0.85 mg/dL (ref 0.50–1.10)
Calcium: 9.1 mg/dL (ref 8.4–10.5)
Chloride: 106 mEq/L (ref 96–112)
GFR calc Af Amer: >90 mL/min (ref >90)
GFR calc non Af Amer: >90 mL/min (ref >90)
Glucose, Bld: 76 mg/dL (ref 70–99)
POTASSIUM: 3.5 meq/L — AB (ref 3.7–5.3)
Sodium: 145 mEq/L (ref 137–147)
TOTAL PROTEIN: 7.3 g/dL (ref 6.0–8.3)
Total Bilirubin: 0.3 mg/dL (ref 0.3–1.2)

## 2013-04-08 LAB — URINALYSIS, ROUTINE W REFLEX MICROSCOPIC
Bilirubin Urine: NEGATIVE
GLUCOSE, UA: NEGATIVE mg/dL
Ketones, ur: 15 mg/dL — AB
Leukocytes, UA: NEGATIVE
Nitrite: NEGATIVE
PH: 6.5 (ref 5.0–8.0)
Protein, ur: 30 mg/dL — AB
Specific Gravity, Urine: 1.016 (ref 1.005–1.030)
Urobilinogen, UA: 0.2 mg/dL (ref 0.0–1.0)

## 2013-04-08 LAB — URINE MICROSCOPIC-ADD ON

## 2013-04-08 MED ORDER — METOCLOPRAMIDE HCL 5 MG/ML IJ SOLN
10.0000 mg | Freq: Once | INTRAMUSCULAR | Status: AC
  Administered 2013-04-08: 10 mg via INTRAVENOUS
  Filled 2013-04-08: qty 2

## 2013-04-08 MED ORDER — SODIUM CHLORIDE 0.9 % IV BOLUS (SEPSIS)
1000.0000 mL | Freq: Once | INTRAVENOUS | Status: AC
  Administered 2013-04-08: 1000 mL via INTRAVENOUS

## 2013-04-08 MED ORDER — DIPHENHYDRAMINE HCL 50 MG/ML IJ SOLN
25.0000 mg | Freq: Once | INTRAMUSCULAR | Status: AC
  Administered 2013-04-08: 25 mg via INTRAVENOUS
  Filled 2013-04-08: qty 1

## 2013-04-08 MED ORDER — MORPHINE SULFATE 4 MG/ML IJ SOLN
4.0000 mg | Freq: Once | INTRAMUSCULAR | Status: AC
  Administered 2013-04-08: 4 mg via INTRAVENOUS
  Filled 2013-04-08: qty 1

## 2013-04-08 NOTE — ED Notes (Addendum)
Pt reports headache x couple of days, but today it became severe. Pt very tearful in triage, in obvious pain. Reports nausea today. Took BC at home with no relief. Pt also 1 week post partum, vaginal delivery.

## 2013-04-08 NOTE — ED Provider Notes (Signed)
CSN: [id number]     Arrival date & time 04/08/13  1545 History   First MD Initiated Contact with Patient 04/08/13 1601     Chief Complaint  Patient presents with  . Headache  . Nausea   (Consider location/radiation/quality/duration/timing/severity/associated sxs/prior Treatment) Patient is a 28 y.o. female presenting with headaches.  Headache Pain location:  Frontal Quality: throbbing. Radiates to:  Does not radiate Pain severity now: severe. Onset quality:  Gradual Duration:  3 days Timing:  Constant Progression:  Worsening Chronicity:  Recurrent Similar to prior headaches: no (worse )   Context comment:  About 1 week post partum from term vaginal delivery Relieved by:  Nothing Exacerbated by: laying flat. Ineffective treatments:  None tried Associated symptoms: nausea and vomiting   Associated symptoms: no abdominal pain, no blurred vision, no congestion, no cough, no diarrhea, no fever, no focal weakness, no neck pain, no numbness, no photophobia, no tingling, no visual change and no weakness     Past Medical History  Diagnosis Date  . UTI (lower urinary tract infection)   . Chlamydia   . Gonorrhea   . Trichomonas   . Abnormal Pap smear    Past Surgical History  Procedure Laterality Date  . Wisdom tooth extraction    . Colposcopy     Family History  Problem Relation Age of Onset  . Cancer Maternal Grandmother    History  Substance Use Topics  . Smoking status: Current Every Day Smoker -- 0.25 packs/day    Types: Cigarettes  . Smokeless tobacco: Never Used  . Alcohol Use: No     Comment: ocassionally- not since pregnancy    OB History   Grav Para Term Preterm Abortions TAB SAB Ect Mult Living   5 3 2 1 2 2    2      Review of Systems  Constitutional: Negative for fever.  HENT: Negative for congestion.   Eyes: Negative for blurred vision and photophobia.  Respiratory: Negative for cough and shortness of breath.   Cardiovascular: Negative for chest pain.   Gastrointestinal: Positive for nausea and vomiting. Negative for abdominal pain and diarrhea.  Musculoskeletal: Negative for neck pain.  Neurological: Positive for headaches. Negative for focal weakness and numbness.  All other systems reviewed and are negative.    Allergies  Review of patient's allergies indicates no known allergies.  Home Medications   Current Outpatient Rx  Name  Route  Sig  Dispense  Refill  . ibuprofen (ADVIL,MOTRIN) 600 MG tablet   Oral   Take 1 tablet (600 mg total) by mouth every 6 (six) hours as needed.   30 tablet   5   . Iron-FA-B Cmp-C-Biot-Probiotic (FUSION PLUS) CAPS   Oral   Take 1 capsule by mouth daily before breakfast.   30 capsule   5   . oxyCODONE-acetaminophen (PERCOCET/ROXICET) 5-325 MG per tablet   Oral   Take 1-2 tablets by mouth every 4 (four) hours as needed for severe pain (moderate - severe pain).   40 tablet   0   . Prenatal Vit-Fe Fumarate-FA (PRENATAL MULTIVITAMIN) TABS tablet   Oral   Take 1 tablet by mouth daily at 12 noon.          BP 153/92  Pulse 68  Temp(Src) 98.1 F (36.7 C) (Oral)  Resp 20  SpO2 100%  LMP 07/20/2012 Physical Exam  Nursing note and vitals reviewed. Constitutional: She is oriented to person, place, and time. She appears well-developed and well-nourished. No distress.  Tearful  HENT:  Head: Normocephalic and atraumatic.  Mouth/Throat: Oropharynx is clear and moist.  Eyes: Conjunctivae are normal. Pupils are equal, round, and reactive to light. No scleral icterus.  Neck: Neck supple.  Cardiovascular: Normal rate, regular rhythm, normal heart sounds and intact distal pulses.   No murmur heard. Pulmonary/Chest: Effort normal and breath sounds normal. No stridor. No respiratory distress. She has no rales.  Abdominal: Soft. Bowel sounds are normal. She exhibits no distension. There is no tenderness.  Musculoskeletal: Normal range of motion.  Neurological: She is alert and oriented to person,  place, and time. She has normal strength. No cranial nerve deficit or sensory deficit. Coordination normal. GCS eye subscore is 4. GCS verbal subscore is 5. GCS motor subscore is 6.  Skin: Skin is warm and dry. No rash noted.  Psychiatric: She has a normal mood and affect. Her behavior is normal.    ED Course  Procedures (including critical care time) Labs Review Labs Reviewed  COMPREHENSIVE METABOLIC PANEL - Abnormal; Notable for the following:    Potassium 3.5 (*)    ALT 42 (*)    All other components within normal limits  URINALYSIS, ROUTINE W REFLEX MICROSCOPIC - Abnormal; Notable for the following:    Hgb urine dipstick SMALL (*)    Ketones, ur 15 (*)    Protein, ur 30 (*)    All other components within normal limits  URINE MICROSCOPIC-ADD ON - Abnormal; Notable for the following:    Squamous Epithelial / LPF FEW (*)    All other components within normal limits  CBC   Imaging Review Ct Head Wo Contrast  04/08/2013   CLINICAL DATA:  Headache.  EXAM: CT HEAD WITHOUT CONTRAST  TECHNIQUE: Contiguous axial images were obtained from the base of the skull through the vertex without intravenous contrast.  COMPARISON:  DG CERVICAL SPINE COMPLETE dated 06/22/2010; CT HEAD W/O CM dated 04/08/2004  FINDINGS: No mass. No hydrocephalus. No hemorrhage. Orbits are unremarkable. No acute bony abnormality. Visualized paranasal sinuses and mastoids are clear.  IMPRESSION: No acute abnormality.   Electronically Signed   By: Maisie Fushomas  Register   On: 04/08/2013 17:39  All radiology studies independently viewed by me.     EKG Interpretation   None       MDM   1. Headache   2. Postpartum pain    28 yo female just over 1 week post partum from uncomplicated vaginal delivery presenting with headache.  Frontal, severe, gradual onset.  Normal neurologic exam and no focal neuro complaints.  She is not breastfeeding.  She reports abdominal pain and lochia are improving.  Given severity of headache, will  check head CT.  She was moderately hypertensive on arrival, but had improved slightly at time of my exam.  Labs and UA pending to help evaluate for pre-eclampsia.  IV reglan, benadryl, and fluids for symptom relief.    Initial medication did not improve headache.  Given IV morphine with better relief.    Discussed case with Dr. Tamela OddiJackson-Moore with OB.  Given severity of headache and elevated blood pressures, [facility].    10:06 PM Re-evaluated pt at this time.  She was sleeping and very drowsy when awakened.  However, she was able to get up and ambulate in the hall.  She was AOx3.  She understood [facility].    Candyce ChurnJohn David Tavaughn Silguero, MD 04/08/13  2209 

## 2013-04-09 ENCOUNTER — Inpatient Hospital Stay (HOSPITAL_COMMUNITY)
Admit: 2013-04-09 | Discharge: 2013-04-09 | Disposition: A | Discharge status: Home / Self Care | Payer: Medicaid Other | Attending: Neurology | Admitting: Neurology

## 2013-04-09 ENCOUNTER — Ambulatory Visit (HOSPITAL_COMMUNITY)
Admit: 2013-04-09 | Discharge: 2013-04-09 | Disposition: A | Discharge status: Home / Self Care | Payer: Medicaid Other | Attending: Obstetrics & Gynecology | Admitting: Obstetrics & Gynecology

## 2013-04-09 ENCOUNTER — Encounter (HOSPITAL_COMMUNITY): Payer: Self-pay | Admitting: *Deleted

## 2013-04-09 DIAGNOSIS — R51 Headache: Secondary | ICD-10-CM

## 2013-04-09 DIAGNOSIS — O152 Eclampsia in the puerperium: Secondary | ICD-10-CM | POA: Diagnosis present

## 2013-04-09 DIAGNOSIS — R569 Unspecified convulsions: Secondary | ICD-10-CM

## 2013-04-09 LAB — URINE DRUGS OF ABUSE SCREEN W ALC, ROUTINE (REF LAB)
Amphetamine Screen, Ur: NEGATIVE
BARBITURATE QUANT UR: NEGATIVE
Benzodiazepines.: NEGATIVE
Cocaine Metabolites: NEGATIVE
Creatinine,U: 96.4 mg/dL
MARIJUANA METABOLITE: POSITIVE — AB
METHADONE: NEGATIVE
Opiate Screen, Urine: POSITIVE — AB
PHENCYCLIDINE (PCP): NEGATIVE
Propoxyphene: NEGATIVE

## 2013-04-09 LAB — COMPREHENSIVE METABOLIC PANEL
ALK PHOS: 83 U/L (ref 39–117)
ALT: 27 U/L (ref 0–35)
AST: 16 U/L (ref 0–37)
Albumin: 2.7 g/dL — ABNORMAL LOW (ref 3.5–5.2)
BUN: 8 mg/dL (ref 6–23)
CO2: 23 meq/L (ref 19–32)
Calcium: 7.9 mg/dL — ABNORMAL LOW (ref 8.4–10.5)
Chloride: 106 mEq/L (ref 96–112)
Creatinine, Ser: 0.82 mg/dL (ref 0.50–1.10)
Glucose, Bld: 98 mg/dL (ref 70–99)
POTASSIUM: 3.2 meq/L — AB (ref 3.7–5.3)
SODIUM: 141 meq/L (ref 137–147)
Total Bilirubin: 0.3 mg/dL (ref 0.3–1.2)
Total Protein: 5.7 g/dL — ABNORMAL LOW (ref 6.0–8.3)

## 2013-04-09 LAB — MRSA PCR SCREENING: MRSA by PCR: NEGATIVE

## 2013-04-09 LAB — LACTATE DEHYDROGENASE: LDH: 225 U/L (ref 94–250)

## 2013-04-09 LAB — CBC
HCT: 36.2 % (ref 36.0–46.0)
Hemoglobin: 12.3 g/dL (ref 12.0–15.0)
MCH: 31.1 pg (ref 26.0–34.0)
MCHC: 34 g/dL (ref 30.0–36.0)
MCV: 91.4 fL (ref 78.0–100.0)
Platelets: 300 10*3/uL (ref 150–400)
RBC: 3.96 MIL/uL (ref 3.87–5.11)
RDW: 13.3 % (ref 11.5–15.5)
WBC: 8.4 10*3/uL (ref 4.0–10.5)

## 2013-04-09 MED ORDER — LACTATED RINGERS IV SOLN
INTRAVENOUS | Status: DC
  Administered 2013-04-09 – 2013-04-10 (×4): via INTRAVENOUS

## 2013-04-09 MED ORDER — ONDANSETRON HCL 4 MG/2ML IJ SOLN: 4.0000 mg | Freq: Four times a day (QID) | INTRAMUSCULAR | Status: DC | PRN

## 2013-04-09 MED ORDER — MAGNESIUM SULFATE BOLUS VIA INFUSION
4.0000 g | Freq: Once | INTRAVENOUS | Status: AC
  Administered 2013-04-09: 4 g via INTRAVENOUS
  Filled 2013-04-09: qty 500

## 2013-04-09 MED ORDER — METOCLOPRAMIDE HCL 5 MG/ML IJ SOLN: 10.0000 mg | Freq: Four times a day (QID) | INTRAMUSCULAR | Status: DC | PRN

## 2013-04-09 MED ORDER — BIOTENE DRY MOUTH MT LIQD
15.0000 mL | Freq: Two times a day (BID) | OROMUCOSAL | Status: DC
Start: 2013-04-09 — End: 2013-04-11
  Administered 2013-04-09 – 2013-04-10 (×3): 15 mL via OROMUCOSAL

## 2013-04-09 MED ORDER — GADOBENATE DIMEGLUMINE 529 MG/ML IV SOLN
20.0000 mL | Freq: Once | INTRAVENOUS | Status: AC | PRN
  Administered 2013-04-09: 20 mL via INTRAVENOUS

## 2013-04-09 MED ORDER — ONDANSETRON HCL 4 MG PO TABS: 4.0000 mg | ORAL_TABLET | Freq: Four times a day (QID) | ORAL | Status: DC | PRN

## 2013-04-09 MED ORDER — MAGNESIUM SULFATE 40 MG/ML IJ SOLN: 4.0000 g | Freq: Once | INTRAMUSCULAR | Status: DC

## 2013-04-09 MED ORDER — OXYCODONE-ACETAMINOPHEN 5-325 MG PO TABS
2.0000 | ORAL_TABLET | ORAL | Status: DC | PRN
  Administered 2013-04-09: 2 via ORAL
  Filled 2013-04-09: qty 2

## 2013-04-09 MED ORDER — HYDROMORPHONE HCL PF 1 MG/ML IJ SOLN
0.5000 mg | INTRAMUSCULAR | Status: DC | PRN
  Administered 2013-04-09: 0.5 mg via INTRAVENOUS
  Filled 2013-04-09: qty 1

## 2013-04-09 MED ORDER — MAGNESIUM SULFATE 40 G IN LACTATED RINGERS - SIMPLE
2.0000 g/h | INTRAVENOUS | Status: DC
  Administered 2013-04-09 (×2): 2 g/h via INTRAVENOUS
  Filled 2013-04-09 (×2): qty 500

## 2013-04-09 NOTE — Progress Notes (Signed)
[facility] for [facility]

## 2013-04-09 NOTE — Progress Notes (Signed)
S/P Postpartum Eclamptic Seizure  Subjective: no complaints, tolerating PO and denies pain Patient denies SOB, HA, RUQ pain, vision changes. Reports her tongue is swollen from biting it.  Denies difficulty swallowing, tolerating PO fluids.   Patient had an eclamptic seizure, she does not have any recollection of her admission or time during or immediately after the seizure. She reports having HA for 3 days leading up to last night. They were mild and came and went in intensity however yesterday her HA increased in intensity and severity.   She is bottle feeding, her daughter is at home with family.  Objective: Blood pressure 95/80, pulse 94, temperature 98.9 F (37.2 C), temperature source Oral, resp. rate 16, height 5\' 5"  (1.651 m), weight 189 lb 12.8 oz (86.093 kg), last menstrual period 07/20/2012, SpO2 100.00%.  Physical Exam:  General: alert, cooperative and appears stated age Lochia: appropriate Uterine Fundus: firm Incision: NA DVT Evaluation: No evidence of DVT seen on physical exam. Negative Homan's sign. Physical Examination: Mental status - alert, oriented to person, place, and time Eyes - pupils equal and reactive, extraocular eye movements intact Ears - bilateral TM's and external ear canals normal Chest - clear to auscultation, no wheezes, rales or rhonchi, symmetric air entry Heart - normal rate, regular rhythm, normal S1, S2, no murmurs, rubs, clicks or gallops Abdomen - soft, nontender, nondistended, no masses or organomegaly Neurological - alert, oriented, normal speech, no focal findings or movement disorder noted, screening mental status exam normal, motor and sensory grossly normal bilaterally, normal muscle tone, no tremors, strength 5/5 Musculoskeletal - no joint tenderness, deformity or swelling Extremities - peripheral pulses normal, no pedal edema, no clubbing or cyanosis Skin - normal coloration and turgor, no rashes, no suspicious skin lesions noted Mouth-  Tongue bilateral bite abrasions, healing, edema and bruising noted. No exudate or signs of infection.    Recent Labs  04/08/13 1555 04/09/13 0530  HGB 13.7 12.3  HCT 39.9 36.2    Assessment/Plan: S/P Postpartum Eclamptic Seizure, currently stable Normotensive, Afebrile, Appropriate Output Labs WNL CT scan WNL MRI Pending Neuro Consult Pending Patient remains of MgSO4, bedrest, strict I&O Advance to normal diet Reviewed plan of care and consulted w/ MD Clearance CootsHarper who agrees.     LOS: 1 day   [facility]inity HealthWREN, Graves Nipp 04/09/2013, 9:27 AM

## 2013-04-09 NOTE — Consult Note (Signed)
NEURO HOSPITALIST CONSULT NOTE    Reason for Consult: Seizure  HPI:                                                                                                                                          Alicia Castro is an 28 y.o. female who presented to the hospital due to multiple days of HA which seemed to worsen over the course of three days associated with N/V. Patient denies any history of migraine HA.  On arrival to the ED patients BP was 153/92, UDS positive for THC and opiates.  On arrival to the ED there was report of seizure activity.  While at [facility] hospital BP has remained normo tensive and patient has had no further seizures. Patient denies any history of seizure, traumatic brain injury, concussion.  She states she was a normal vaginal birth and both father and patient denies any history of febrile seizure.   Past Medical History  Diagnosis Date  . UTI (lower urinary tract infection)   . Chlamydia   . Gonorrhea   . Trichomonas   . Abnormal Pap smear     Past Surgical History  Procedure Laterality Date  . Wisdom tooth extraction    . Colposcopy      Family History  Problem Relation Age of Onset  . Cancer Maternal Grandmother      Social History:  reports that she has been smoking Cigarettes.  She has been smoking about 0.25 packs per day. She has never used smokeless tobacco. She reports that she uses illicit drugs (Marijuana) about 7 times per week. She reports that she does not drink alcohol.  No Known Allergies  MEDICATIONS:                                                                                                                     Prior to Admission:  Prescriptions prior to admission  Medication Sig Dispense Refill  . ibuprofen (ADVIL,MOTRIN) 600 MG tablet Take 1 tablet (600 mg total) by mouth every 6 (six) hours as needed.  30 tablet  5  . Iron-FA-B Cmp-C-Biot-Probiotic (FUSION PLUS) CAPS Take 1 capsule by mouth daily  before breakfast.  30 capsule  5  . oxyCODONE-acetaminophen (PERCOCET/ROXICET) 5-325 MG per tablet Take 1-2  tablets by mouth every 4 (four) hours as needed for severe pain (moderate - severe pain).  40 tablet  0  . Prenatal Vit-Fe Fumarate-FA (PRENATAL MULTIVITAMIN) TABS tablet Take 1 tablet by mouth daily at 12 noon.       Scheduled: . antiseptic oral rinse  15 mL Mouth Rinse BID     ROS:                                                                                                                                       History obtained from the patient  General ROS: negative for - chills, fatigue, fever, night sweats, weight gain or weight loss Psychological ROS: negative for - behavioral disorder, hallucinations, memory difficulties, mood swings or suicidal ideation Ophthalmic ROS: negative for - blurry vision, double vision, eye pain or loss of vision ENT ROS: negative for - epistaxis, nasal discharge, oral lesions, sore throat, tinnitus or vertigo Allergy and Immunology ROS: negative for - hives or itchy/watery eyes Hematological and Lymphatic ROS: negative for - bleeding problems, bruising or swollen lymph nodes Endocrine ROS: negative for - galactorrhea, hair pattern changes, polydipsia/polyuria or temperature intolerance Respiratory ROS: negative for - cough, hemoptysis, shortness of breath or wheezing Cardiovascular ROS: negative for - chest pain, dyspnea on exertion, edema or irregular heartbeat Gastrointestinal ROS: negative for - abdominal pain, diarrhea, hematemesis, nausea/vomiting or stool incontinence Genito-Urinary ROS: negative for - dysuria, hematuria, incontinence or urinary frequency/urgency Musculoskeletal ROS: negative for - joint swelling or muscular weakness Neurological ROS: as noted in HPI Dermatological ROS: negative for rash and skin lesion changes   Blood pressure 95/80, pulse 94, temperature 98.9 F (37.2 C), temperature source Oral, resp. rate 16, height  5\' 5"  (1.651 m), weight 86.093 kg (189 lb 12.8 oz), last menstrual period 07/20/2012, SpO2 100.00%.   Neurologic Examination:                                                                                                      Mental Status: Alert, oriented, thought content appropriate.  Speech fluent without evidence of aphasia.  Able to follow 3 step commands without difficulty. Cranial Nerves: II: Discs flat bilaterally; Visual fields grossly normal, pupils equal, round, reactive to light and accommodation III,IV, VI: ptosis not present, extra-ocular motions intact bilaterally V,VII: smile symmetric, facial light touch sensation normal bilaterally VIII: hearing normal bilaterally IX,X: gag reflex present XI: bilateral shoulder shrug XII: midline tongue extension without atrophy or fasciculations  Motor: Right : Upper  extremity   5/5    Left:     Upper extremity   5/5  Lower extremity   5/5     Lower extremity   5/5 Tone and bulk:normal tone throughout; no atrophy noted Sensory: Pinprick and light touch intact throughout, bilaterally Deep Tendon Reflexes:  Right: Upper Extremity   Left: Upper extremity   biceps (C-5 to C-6) 2/4   biceps (C-5 to C-6) 2/4 tricep (C7) 2/4    triceps (C7) 2/4 Brachioradialis (C6) 2/4  Brachioradialis (C6) 2/4  Lower Extremity Lower Extremity  quadriceps (L-2 to L-4) 2/4   quadriceps (L-2 to L-4) 2/4 Achilles (S1) 2/4   Achilles (S1) 2/4  Plantars: Right: downgoing   Left: downgoing Cerebellar: normal finger-to-nose,  normal heel-to-shin test Gait: not tested due to multiple leads CV: pulses palpable throughout    Lab Results: Basic Metabolic Panel:  Recent Labs Lab 04/08/13 1555 04/09/13 0530  NA 145 141  K 3.5* 3.2*  CL 106 106  CO2 22 23  GLUCOSE 76 98  BUN 11 8  CREATININE 0.85 0.82  CALCIUM 9.1 7.9*    Liver Function Tests:  Recent Labs Lab 04/08/13 1555 04/09/13 0530  AST 31 16  ALT 42* 27  ALKPHOS 107 83  BILITOT  0.3 0.3  PROT 7.3 5.7*  ALBUMIN 3.5 2.7*   No results found for this basename: LIPASE, AMYLASE,  in the last 168 hours No results found for this basename: AMMONIA,  in the last 168 hours  CBC:  Recent Labs Lab 04/08/13 1555 04/09/13 0530  WBC 5.0 8.4  HGB 13.7 12.3  HCT 39.9 36.2  MCV 94.3 91.4  PLT 292 300    Cardiac Enzymes: No results found for this basename: CKTOTAL, CKMB, CKMBINDEX, TROPONINI,  in the last 168 hours  Lipid Panel: No results found for this basename: CHOL, TRIG, HDL, CHOLHDL, VLDL, LDLCALC,  in the last 168 hours  CBG: No results found for this basename: GLUCAP,  in the last 168 hours  Microbiology: Results for orders placed during the hospital encounter of 04/08/13  MRSA PCR SCREENING     Status: None   Collection Time    04/09/13  2:00 AM      Result Value Range Status   MRSA by PCR NEGATIVE  NEGATIVE Final   Comment:            The GeneXpert MRSA Assay (FDA     approved for NASAL specimens     only), is one component of a     comprehensive MRSA colonization     surveillance program. It is not     intended to diagnose MRSA     infection nor to guide or     monitor treatment for     MRSA infections.    Coagulation Studies: No results found for this basename: LABPROT, INR,  in the last 72 hours  Imaging: Ct Head Wo Contrast  04/08/2013   CLINICAL DATA:  Headache.  EXAM: CT HEAD WITHOUT CONTRAST  TECHNIQUE: Contiguous axial images were obtained from the base of the skull through the vertex without intravenous contrast.  COMPARISON:  DG CERVICAL SPINE COMPLETE dated 06/22/2010; CT HEAD W/O CM dated 04/08/2004  FINDINGS: No mass. No hydrocephalus. No hemorrhage. Orbits are unremarkable. No acute bony abnormality. Visualized paranasal sinuses and mastoids are clear.  IMPRESSION: No acute abnormality.   Electronically Signed   By: Maisie Fus  Register   On: 04/08/2013 17:39  Assessment and plan per attending neurologist  Felicie Morn  PA-C [facility] [phone]  04/09/2013, 9:54 AM  MRI reviewed, consistent with PRES, which is found in ecclampsia.   Assessment/Plan:  27 YO female post partum who [facility], elevated BP, proteinuria and single seizure. I feel this is most consistent with ecclampsia.  Patient has remained stable and seizure free since BP has been normalized. Currently on Magnesium sulfate infusion for BP with latest BP 118/86.  MRI brain with and without contrast pending. I would consider this a provoked seizure, but would still avoid driving for a couple of months to ensure no further seizures.   Recommend: 1) BP control 2) I would favor continuing AEDs(in this case magnesium) for at least 48 hours from the last seizure.  3) No driving, operating heavy machinery, perform activities at heights, swimming or participation in water activities for 2 months or until release by outpatient physician.   4) Neurology will sign off at this time, please call with any further questions or concerns or if seizures recurr.    Ritta Slot, MD [facility] [phone]  If 7pm- 7am, please page neurology on call at [phone].

## 2013-04-09 NOTE — H&P (Signed)
Alicia Castro is an 28 y.o. female.   Chief Complaint: Headache/nausea HPI: The patient presented to the [facility] with a several day history of headache.  The headache worsened in severity today.  The headache is frontal/temporal.  There is associated nausea/vomiting. The blood pressures in the ED were labile initially.  After IV morphine, the blood pressures normalized.  There were no other neurological symptoms at that point. She is 10 days postpartum from an uncomplicated vaginal delivery.  There is a vague history of headaches; no specific diagnosis i.e., migraine headaches.   Labs are not consistent with a HELLP syndrome.  She was transferred to [facility] for further observation.  On arrival to the [facility], a seizure was witnessed by the staff.  Past Medical History  Diagnosis Date  . UTI (lower urinary tract infection)   . Chlamydia   . Gonorrhea   . Trichomonas   . Abnormal Pap smear     Past Surgical History  Procedure Laterality Date  . Wisdom tooth extraction    . Colposcopy      Family History  Problem Relation Age of Onset  . Cancer Maternal Grandmother    Social History:  reports that she has been smoking Cigarettes.  She has been smoking about 0.25 packs per day. She has never used smokeless tobacco. She reports that she uses illicit drugs (Marijuana) about 7 times per week. She reports that she does not drink alcohol.  Allergies: No Known Allergies  Medications Prior to Admission  Medication Sig Dispense Refill  . ibuprofen (ADVIL,MOTRIN) 600 MG tablet Take 1 tablet (600 mg total) by mouth every 6 (six) hours as needed.  30 tablet  5  . Iron-FA-B Cmp-C-Biot-Probiotic (FUSION PLUS) CAPS Take 1 capsule by mouth daily before breakfast.  30 capsule  5  . oxyCODONE-acetaminophen (PERCOCET/ROXICET) 5-325 MG per tablet Take 1-2 tablets by mouth every 4 (four) hours as needed for severe pain (moderate - severe pain).  40 tablet  0  . Prenatal Vit-Fe Fumarate-FA (PRENATAL  MULTIVITAMIN) TABS tablet Take 1 tablet by mouth daily at 12 noon.        Results for orders placed during the hospital encounter of 04/08/13 (from the past 48 hour(s))  CBC     Status: None   Collection Time    04/08/13  3:55 PM      Result Value Range   WBC 5.0  4.0 - 10.5 K/uL   RBC 4.23  3.87 - 5.11 MIL/uL   Hemoglobin 13.7  12.0 - 15.0 g/dL   HCT 39.9  36.0 - 46.0 %   MCV 94.3  78.0 - 100.0 fL   MCH 32.4  26.0 - 34.0 pg   MCHC 34.3  30.0 - 36.0 g/dL   RDW 13.6  11.5 - 15.5 %   Platelets 292  150 - 400 K/uL  COMPREHENSIVE METABOLIC PANEL     Status: Abnormal   Collection Time    04/08/13  3:55 PM      Result Value Range   Sodium 145  137 - 147 mEq/L   Potassium 3.5 (*) 3.7 - 5.3 mEq/L   Chloride 106  96 - 112 mEq/L   CO2 22  19 - 32 mEq/L   Glucose, Bld 76  70 - 99 mg/dL   BUN 11  6 - 23 mg/dL   Creatinine, Ser 0.85  0.50 - 1.10 mg/dL   Calcium 9.1  8.4 - 10.5 mg/dL   Total Protein 7.3  6.0 -  8.3 g/dL   Albumin 3.5  3.5 - 5.2 g/dL   AST 31  0 - 37 U/L   Comment: HEMOLYSIS AT THIS LEVEL MAY AFFECT RESULT   ALT 42 (*) 0 - 35 U/L   Alkaline Phosphatase 107  39 - 117 U/L   Total Bilirubin 0.3  0.3 - 1.2 mg/dL   GFR calc non Af Amer >90  >90 mL/min   GFR calc Af Amer >90  >90 mL/min   Comment: (NOTE)     The eGFR has been calculated using the CKD EPI equation.     This calculation has not been validated in all clinical situations.     eGFR's persistently <90 mL/min signify possible Chronic Kidney     Disease.  URINALYSIS, ROUTINE W REFLEX MICROSCOPIC     Status: Abnormal   Collection Time    04/08/13  4:56 PM      Result Value Range   Color, Urine YELLOW  YELLOW   APPearance CLEAR  CLEAR   Specific Gravity, Urine 1.016  1.005 - 1.030   pH 6.5  5.0 - 8.0   Glucose, UA NEGATIVE  NEGATIVE mg/dL   Hgb urine dipstick SMALL (*) NEGATIVE   Bilirubin Urine NEGATIVE  NEGATIVE   Ketones, ur 15 (*) NEGATIVE mg/dL   Protein, ur 30 (*) NEGATIVE mg/dL   Urobilinogen, UA 0.2   0.0 - 1.0 mg/dL   Nitrite NEGATIVE  NEGATIVE   Leukocytes, UA NEGATIVE  NEGATIVE  URINE MICROSCOPIC-ADD ON     Status: Abnormal   Collection Time    04/08/13  4:56 PM      Result Value Range   Squamous Epithelial / LPF FEW (*) RARE   WBC, UA 0-2  <3 WBC/hpf   RBC / HPF 0-2  <3 RBC/hpf   Bacteria, UA RARE  RARE   Ct Head Wo Contrast  04/08/2013   CLINICAL DATA:  Headache.  EXAM: CT HEAD WITHOUT CONTRAST  TECHNIQUE: Contiguous axial images were obtained from the base of the skull through the vertex without intravenous contrast.  COMPARISON:  DG CERVICAL SPINE COMPLETE dated 06/22/2010; CT HEAD W/O CM dated 04/08/2004  FINDINGS: No mass. No hydrocephalus. No hemorrhage. Orbits are unremarkable. No acute bony abnormality. Visualized paranasal sinuses and mastoids are clear.  IMPRESSION: No acute abnormality.   Electronically Signed   By: Marcello Moores  Register   On: 04/08/2013 17:39    ROS  Neuro: headache  Blood pressure 126/80, pulse 89, temperature 98.1 F (36.7 C), temperature source Oral, resp. rate 20, last menstrual period 07/20/2012, SpO2 100.00%. Physical Exam  General: somnolent, oriented to person/place/time Neuro: Nonfocal; intact to sensory; strength equal throughout Assessment/Plan Postpartum headache/seizure--DDX--eclampsia, cerebrovascular d/o i,e PRES  -->MgSO4 for seizure prophylaxis -->Analgesics/antimetics --Neurology consult  JACKSON-MOORE,Winson Eichorn A 04/09/2013, 12:52 AM

## 2013-04-09 NOTE — Progress Notes (Signed)
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  When transporting patient out of room assure that outer coverings are clean and that area where patient is being transported to is aware of the need for Contact Precautions.  Scrupulous hand hygiene should be practiced at all times. Leana Gameronstance D. Davide Risdon, RN, CIC Infection Prevention Specialist

## 2013-04-09 NOTE — Procedures (Signed)
History: 28 yo F with ecclampsia  Technique: This is a 17 channel routine scalp EEG performed at the bedside with bipolar and monopolar montages arranged in accordance to the international 10/20 system of electrode placement. One channel was dedicated to EKG recording.    Background: There is a well defined posterior dominant rhythm of 9 Hz that attenuates with eye opening. The predominance of the record consists of sleep, but during periods of maximal wakefullness the patient's background consists of intermixed alpha and beta activity.   Photic stimulation: Physiologic driving is not performed  EEG Abnormalities: none  Clinical Interpretation: This normal EEG is recorded in the waking and sleep state. There was no seizure or seizure predisposition recorded on this study.   Ritta SlotMcNeill Kirkpatrick, MD [facility] [phone]  If 7pm- 7am, please page neurology on call at [phone].

## 2013-04-09 NOTE — Progress Notes (Signed)
MRI results called to Dr. Amada JupiterKirkpatrick.

## 2013-04-09 NOTE — Progress Notes (Signed)
EEG completed; results pending.    

## 2013-04-09 NOTE — Progress Notes (Signed)
Called to rm [phone] for actively seizing pt just admitted - E-link in attendance. Pt suctioned & given O2 by face mask.  1200 Dr Tamela OddiJackson-Moore called by floor RN

## 2013-04-09 NOTE — Progress Notes (Signed)
UR chart review completed.  

## 2013-04-10 LAB — THC (MARIJUANA), URINE, CONFIRMATION: Marijuana, Ur-Confirmation: 267 ng/mL

## 2013-04-10 NOTE — Progress Notes (Signed)
Post Partum Day 10 Subjective: no complaints  Objective: Blood pressure 129/93, pulse 94, temperature 97.4 F (36.3 C), temperature source Oral, resp. rate 18, height 5\' 5"  (1.651 m), weight 206 lb 1.6 oz (93.486 kg), last menstrual period 07/20/2012, SpO2 100.00%.  Physical Exam:  General: alert and no distress Lochia: appropriate Uterine Fundus: firm Incision: none DVT Evaluation: No evidence of DVT seen on physical exam.   Recent Labs  04/08/13 1555 04/09/13 0530  HGB 13.7 12.3  HCT 39.9 36.2    Assessment/Plan: Postpartum eclampsia.  Stable.  MRI of head c/w PRES.  Brisk urine output.  Will D/C magnesium sulfate.  Monitor closely.   LOS: 2 days   [name],[name] 04/10/2013, 11:43 AM

## 2013-04-11 ENCOUNTER — Other Ambulatory Visit (HOSPITAL_COMMUNITY): Payer: Medicaid Other

## 2013-04-11 ENCOUNTER — Ambulatory Visit (HOSPITAL_COMMUNITY): Admission: RE | Admit: 2013-04-11 | Payer: Medicaid Other | Source: Ambulatory Visit

## 2013-04-11 DIAGNOSIS — O152 Eclampsia in the puerperium: Principal | ICD-10-CM

## 2013-04-11 DIAGNOSIS — I6783 Posterior reversible encephalopathy syndrome: Secondary | ICD-10-CM | POA: Diagnosis present

## 2013-04-11 LAB — OPIATE, QUANTITATIVE, URINE
6 Monoacetylmorphine, Ur-Confirm: NEGATIVE ng/mL
Codeine Urine: NEGATIVE ng/mL
HYDROCODONE: NEGATIVE ng/mL
Hydromorphone GC/MS Conf: NEGATIVE ng/mL
MORPHINE CONFIRM: 2190 ng/mL
NORHYDROCODONE, UR: NEGATIVE ng/mL
NOROXYCODONE, UR: NEGATIVE ng/mL
Oxycodone, ur: NEGATIVE ng/mL
Oxymorphone: NEGATIVE ng/mL

## 2013-04-11 NOTE — Discharge Instructions (Signed)
Preeclampsia and Eclampsia °Preeclampsia is a condition of high blood pressure during pregnancy. It can happen at 20 weeks or later in pregnancy. If high blood pressure occurs in the second half of pregnancy with no other symptoms, it is called gestational hypertension and goes away after the baby is born. If any of the symptoms listed below develop with gestational hypertension, it is then called preeclampsia. Eclampsia (convulsions) may follow preeclampsia. This is one of the reasons for regular prenatal checkups. Early diagnosis and treatment are very important to prevent eclampsia. °CAUSES  °There is no known cause of preeclampsia/eclampsia in pregnancy. There are several known conditions that may put the pregnant woman at risk, such as: °· The first pregnancy. °· Having preeclampsia in a past pregnancy. °· Having lasting (chronic) high blood pressure. °· Having multiples (twins, triplets). °· Being age 35 or older. °· African American ethnic background. °· Having kidney disease or diabetes. °· Medical conditions such as lupus or blood diseases. °· Being overweight (obese). °SYMPTOMS  °· High blood pressure. °· Headaches. °· Sudden weight gain. °· Swelling of hands, face, legs, and feet. °· Protein in the urine. °· Feeling sick to your stomach (nauseous) and throwing up (vomiting). °· Vision problems (blurred or double vision). °· Numbness in the face, arms, legs, and feet. °· Dizziness. °· Slurred speech. °· Preeclampsia can cause growth retardation in the fetus. °· Separation (abruption) of the placenta. °· Not enough fluid in the amniotic sac (oligohydramnios). °· Sensitivity to bright lights. °· Belly (abdominal) pain. °DIAGNOSIS  °If protein is found in the urine in the second half of pregnancy, this is considered preeclampsia. Other symptoms mentioned above may also be present. °TREATMENT  °It is necessary to treat this. °· Your caregiver may prescribe bed rest early in this condition. Plenty of rest and  salt restriction may be all that is needed. °· Medicines may be necessary to lower blood pressure if the condition does not respond to more conservative measures. °· In more severe cases, hospitalization may be needed: °· For treatment of blood pressure. °· To control fluid retention. °· To monitor the baby to see if the condition is causing harm to the baby. °· Hospitalization is the best way to treat the first sign of preeclampsia. This is so the mother and baby can be watched closely and blood tests can be done effectively and correctly. °· If the condition becomes severe, it may be necessary to induce labor or to remove the infant by surgical means (cesarean section). The best cure for preeclampsia/eclampsia is to deliver the baby. °Preeclampsia and eclampsia involve risks to mother and infant. Your caregiver will discuss these risks with you. Together, you can work out the best possible approach to your problems. Make sure you keep your prenatal visits as scheduled. Not keeping appointments could result in a chronic or permanent injury, pain, disability to you, and death or injury to you or your unborn baby. If there is any problem keeping the appointment, you must call to reschedule. °HOME CARE INSTRUCTIONS  °· Keep your prenatal appointments and tests as scheduled. °· Tell your caregiver if you have any of the above risk factors. °· Get plenty of rest and sleep. °· Eat a balanced diet that is low in salt, and do not add salt to your food. °· Avoid stressful situations. °· Only take over-the-counter and prescriptions medicines for pain, discomfort, or fever as directed by your caregiver. °SEEK IMMEDIATE MEDICAL CARE IF:  °· You develop severe swelling   anywhere in the body. This usually occurs in the legs. °· You gain 05 lb/2.3 kg or more in a week. °· You develop a severe headache, dizziness, problems with your vision, or confusion. °· You have abdominal pain, nausea, or vomiting. °· You have a seizure. °· You  have trouble moving any part of your body, or you develop numbness or problems speaking. °· You have bruising or abnormal bleeding from anywhere in the body. °· You develop a stiff neck. °· You pass out. °MAKE SURE YOU:  °· Understand these instructions. °· Will watch your condition. °· Will get help right away if you are not doing well or get worse. °Document Released: 02/26/2000 Document Revised: 05/23/2011 Document Reviewed: 10/12/2007 °[employer]® Patient Information ©2014 [employer]. ° °

## 2013-04-11 NOTE — Discharge Summary (Signed)
Physician Discharge Summary  Patient ID: Alicia Castro MRN: [id number] DOB/AGE: 28/12/1985 28 y.o.  Admit date: 04/08/2013 Discharge date: 04/11/2013  Admission Diagnoses: Headache Discharge Diagnoses:  Active Problems:   Headache   Eclampsia, postpartum   PRES (posterior reversible encephalopathy syndrome)   Discharged Condition: stable  Hospital Course: The patient presented to the [facility] with a severe headache.  The headache responded to analgesics; a head CT was negative.  She was transferred to [facility] for observation.  On arrival to the [facility] Unit a grand mal seizure was witnessed.  She was transferred to the ICU and parenteral MgSO4 was started.  An MRI of the brain was consistent with PRES.  EEG results were pending at the time of discharge.  Neurology was consulted.  She remained normotensive after the headache pain was controlled.  She was stable at the time of discharge.  Consults: neurology  Significant Diagnostic Studies: see above  Treatments: see above  Discharge Exam: Blood pressure 131/85, pulse 63, temperature 97.9 F (36.6 C), temperature source Oral, resp. rate 18, height 5\' 5"  (1.651 m), weight 206 lb 1.6 oz (93.486 kg), last menstrual period 07/20/2012, SpO2 99.00%. General appearance: alert GI: soft, non-tender; bowel sounds normal; no masses,  no organomegaly Extremities: extremities normal, atraumatic, no cyanosis or edema Neurologic: Grossly normal  Disposition: 01-Home or Self Care  Discharge Orders   Future Orders Complete By Expires   Call MD for:  extreme fatigue  As directed    Call MD for:  persistant dizziness or light-headedness  As directed    Call MD for:  persistant nausea and vomiting  As directed    Call MD for:  severe uncontrolled pain  As directed    Call MD for:  temperature >100.4  As directed    Diet general  As directed    Driving Restrictions  As directed    Comments:     No driving for 2 months   Increase activity  slowly  As directed    Scheduling Instructions:     No strenuous activity, swimming activities for 2 months   Lifting restrictions  As directed    Comments:     No lifting > 5 lbs for 8 weeks   May shower / Bathe  As directed    Comments:     No tub baths for 6 weeks   May walk up steps  As directed    Sexual Activity Restrictions  As directed    Comments:     No intercourse for 6  weeks       Medication List         FUSION PLUS Caps  Take 1 capsule by mouth daily before breakfast.     ibuprofen 600 MG tablet  Commonly known as:  ADVIL,MOTRIN  Take 1 tablet (600 mg total) by mouth every 6 (six) hours as needed.     oxyCODONE-acetaminophen 5-325 MG per tablet  Commonly known as:  PERCOCET/ROXICET  Take 1-2 tablets by mouth every 4 (four) hours as needed for severe pain (moderate - severe pain).     prenatal multivitamin Tabs tablet  Take 1 tablet by mouth daily at 12 noon.           Follow-up Information   Follow up with [name],[name], MD. Schedule an appointment as soon as possible for a visit on 04/15/2013.   Specialty:  Obstetrics and Gynecology   Contact information:   [address] [place]  [phone]       Signed: Antionette CharJACKSON-MOORE,Ameliarose [name] 04/11/2013, 8:50 AM

## 2013-04-12 ENCOUNTER — Inpatient Hospital Stay (HOSPITAL_COMMUNITY)
Admission: AD | Admit: 2013-04-12 | Discharge: 2013-04-16 | DRG: 776 | Disposition: A | Discharge status: Home / Self Care | Payer: Medicaid Other | Source: Ambulatory Visit | Attending: Obstetrics | Admitting: Obstetrics

## 2013-04-12 ENCOUNTER — Encounter (HOSPITAL_COMMUNITY): Payer: Self-pay | Admitting: *Deleted

## 2013-04-12 ENCOUNTER — Ambulatory Visit (HOSPITAL_COMMUNITY)
Admit: 2013-04-12 | Discharge: 2013-04-12 | Disposition: A | Discharge status: Home / Self Care | Payer: Medicaid Other | Attending: Neurology | Admitting: Neurology

## 2013-04-12 DIAGNOSIS — O99335 Smoking (tobacco) complicating the puerperium: Secondary | ICD-10-CM | POA: Diagnosis present

## 2013-04-12 DIAGNOSIS — R519 Headache, unspecified: Secondary | ICD-10-CM

## 2013-04-12 DIAGNOSIS — O139 Gestational [pregnancy-induced] hypertension without significant proteinuria, unspecified trimester: Secondary | ICD-10-CM

## 2013-04-12 DIAGNOSIS — R51 Headache: Secondary | ICD-10-CM

## 2013-04-12 DIAGNOSIS — I6783 Posterior reversible encephalopathy syndrome: Secondary | ICD-10-CM

## 2013-04-12 DIAGNOSIS — O152 Eclampsia in the puerperium: Secondary | ICD-10-CM

## 2013-04-12 DIAGNOSIS — G9349 Other encephalopathy: Secondary | ICD-10-CM | POA: Diagnosis present

## 2013-04-12 DIAGNOSIS — IMO0001 Reserved for inherently not codable concepts without codable children: Principal | ICD-10-CM | POA: Diagnosis present

## 2013-04-12 HISTORY — DX: Eclampsia, unspecified as to time period: O15.9

## 2013-04-12 LAB — CBC
HCT: 36.7 % (ref 36.0–46.0)
Hemoglobin: 12.2 g/dL (ref 12.0–15.0)
MCH: 30.9 pg (ref 26.0–34.0)
MCHC: 33.2 g/dL (ref 30.0–36.0)
MCV: 92.9 fL (ref 78.0–100.0)
PLATELETS: 218 10*3/uL (ref 150–400)
RBC: 3.95 MIL/uL (ref 3.87–5.11)
RDW: 13.5 % (ref 11.5–15.5)
WBC: 4.4 10*3/uL (ref 4.0–10.5)

## 2013-04-12 LAB — URINALYSIS, ROUTINE W REFLEX MICROSCOPIC
Bilirubin Urine: NEGATIVE
GLUCOSE, UA: NEGATIVE mg/dL
Ketones, ur: NEGATIVE mg/dL
Leukocytes, UA: NEGATIVE
Nitrite: NEGATIVE
Protein, ur: NEGATIVE mg/dL
SPECIFIC GRAVITY, URINE: 1.015 (ref 1.005–1.030)
Urobilinogen, UA: 0.2 mg/dL (ref 0.0–1.0)
pH: 7.5 (ref 5.0–8.0)

## 2013-04-12 LAB — COMPREHENSIVE METABOLIC PANEL
ALK PHOS: 84 U/L (ref 39–117)
ALT: 16 U/L (ref 0–35)
AST: 13 U/L (ref 0–37)
Albumin: 3.3 g/dL — ABNORMAL LOW (ref 3.5–5.2)
BILIRUBIN TOTAL: 0.3 mg/dL (ref 0.3–1.2)
BUN: 7 mg/dL (ref 6–23)
CALCIUM: 9 mg/dL (ref 8.4–10.5)
CHLORIDE: 106 meq/L (ref 96–112)
CO2: 27 mEq/L (ref 19–32)
Creatinine, Ser: 0.71 mg/dL (ref 0.50–1.10)
GFR calc Af Amer: >90 mL/min (ref >90)
GLUCOSE: 99 mg/dL (ref 70–99)
POTASSIUM: 4 meq/L (ref 3.7–5.3)
SODIUM: 145 meq/L (ref 137–147)
Total Protein: 6.6 g/dL (ref 6.0–8.3)

## 2013-04-12 LAB — PROTEIN / CREATININE RATIO, URINE
Creatinine, Urine: 40.19 mg/dL
PROTEIN CREATININE RATIO: 0.32 — AB (ref 0.00–0.15)
Total Protein, Urine: 12.9 mg/dL

## 2013-04-12 LAB — URINE MICROSCOPIC-ADD ON

## 2013-04-12 MED ORDER — LACTATED RINGERS IV SOLN
INTRAVENOUS | Status: DC
  Administered 2013-04-12: 14:00:00 via INTRAVENOUS

## 2013-04-12 MED ORDER — HYDROMORPHONE HCL PF 1 MG/ML IJ SOLN: 1.0000 mg | INTRAMUSCULAR | Status: DC | PRN

## 2013-04-12 MED ORDER — HYDROMORPHONE HCL PF 2 MG/ML IJ SOLN
2.0000 mg | INTRAMUSCULAR | Status: AC
  Administered 2013-04-12: 2 mg via INTRAVENOUS
  Filled 2013-04-12: qty 1

## 2013-04-12 MED ORDER — PRENATAL MULTIVITAMIN CH
1.0000 | ORAL_TABLET | Freq: Every day | ORAL | Status: DC
  Administered 2013-04-12 – 2013-04-15 (×4): 1 via ORAL
  Filled 2013-04-12 (×6): qty 1

## 2013-04-12 MED ORDER — ZOLPIDEM TARTRATE 5 MG PO TABS: 5.0000 mg | ORAL_TABLET | Freq: Every evening | ORAL | Status: DC | PRN

## 2013-04-12 MED ORDER — PROMETHAZINE HCL 25 MG PO TABS
25.0000 mg | ORAL_TABLET | Freq: Four times a day (QID) | ORAL | Status: DC | PRN
  Administered 2013-04-12: 25 mg via ORAL
  Filled 2013-04-12: qty 1

## 2013-04-12 MED ORDER — CALCIUM CARBONATE ANTACID 500 MG PO CHEW
2.0000 | CHEWABLE_TABLET | ORAL | Status: DC | PRN
  Filled 2013-04-12: qty 2

## 2013-04-12 MED ORDER — LABETALOL HCL 5 MG/ML IV SOLN
20.0000 mg | INTRAVENOUS | Status: DC | PRN
  Administered 2013-04-12: 20 mg via INTRAVENOUS
  Filled 2013-04-12: qty 4

## 2013-04-12 MED ORDER — LACTATED RINGERS IV SOLN
INTRAVENOUS | Status: DC
  Administered 2013-04-12 – 2013-04-15 (×7): via INTRAVENOUS

## 2013-04-12 MED ORDER — HYDROMORPHONE HCL 2 MG PO TABS
2.0000 mg | ORAL_TABLET | ORAL | Status: DC | PRN
  Administered 2013-04-12 – 2013-04-16 (×5): 2 mg via ORAL
  Filled 2013-04-12 (×5): qty 1

## 2013-04-12 MED ORDER — DOCUSATE SODIUM 100 MG PO CAPS
100.0000 mg | ORAL_CAPSULE | Freq: Every day | ORAL | Status: DC
  Administered 2013-04-13 – 2013-04-15 (×3): 100 mg via ORAL
  Filled 2013-04-12 (×6): qty 1

## 2013-04-12 MED ORDER — MAGNESIUM SULFATE 40 G IN LACTATED RINGERS - SIMPLE
2.0000 g/h | INTRAVENOUS | Status: DC
  Administered 2013-04-12 – 2013-04-14 (×3): 2 g/h via INTRAVENOUS
  Filled 2013-04-12 (×3): qty 500

## 2013-04-12 MED ORDER — MAGNESIUM SULFATE BOLUS VIA INFUSION
4.0000 g | Freq: Once | INTRAVENOUS | Status: AC
  Administered 2013-04-12: 4 g via INTRAVENOUS
  Filled 2013-04-12: qty 500

## 2013-04-12 MED ORDER — ACETAMINOPHEN 325 MG PO TABS
650.0000 mg | ORAL_TABLET | ORAL | Status: DC | PRN
  Administered 2013-04-14 – 2013-04-16 (×5): 650 mg via ORAL
  Filled 2013-04-12 (×5): qty 2

## 2013-04-12 NOTE — Progress Notes (Signed)
Pt rec'd from MAU s/p vaginal del 03/31/13, readmission 04/08/13 d/t HA & then had a seizure, d/c home yesterday, readmitted today for headache re-occurence of HA.

## 2013-04-12 NOTE — MAU Note (Signed)
Pt had vaginal delivery on 03-31-13. Pt has been experiencing H/A since Friday.  [facility] and [facility] after having a sz.  Pt was placed on Mag. Sulfate and discharge home yesterday.  Today pt is experiencing a HA of 10/10 all over her head.  Pt checked BP at [facility] today and reports it to be 189/109.

## 2013-04-12 NOTE — MAU Provider Note (Signed)
Chief Complaint: Postpartum Complications   First Provider Initiated Contact with Patient 04/12/13 1430     SUBJECTIVE HPI: Alicia Castro is a 28 y.o. [id number] presents to MAU with severe headache and elevated BP.  She is 12 days postpartum after a preterm vaginal delivery with SROM at [redacted]w[redacted]d.  After delivery, she was discharged without complication, then 2 days later developed severe headache.  She was admitted on 04/08/13 for eclampsia with seizure prior to admission and received magnesium sulfate x24 hours with discharge from hospital yesterday.  Today, her headache returned, was more severe, and she took her blood pressure at [facility] while waiting for her prescriptions and it was 170s over 100s.  She denies visual disturbances or epigastric pain.  She reports normal lochia, denies urinary symptoms, vaginal itching/burning, dizziness, n/v, or fever/chills.   She is crying in MAU related to her headache pain with her s/o at the bedside.   Past Medical History  Diagnosis Date  . UTI (lower urinary tract infection)     pt has h/o Acinetobacter in urine culture-requires Contact Isolation with each hodpitalization  . Chlamydia   . Gonorrhea   . Trichomonas   . Abnormal Pap smear   . Eclampsia    Past Surgical History  Procedure Laterality Date  . Wisdom tooth extraction    . Colposcopy     History   Social History  . Marital Status: Single    Spouse Name: N/A    Number of Children: N/A  . Years of Education: N/A   Occupational History  . Not on file.   Social History Main Topics  . Smoking status: Current Every Day Smoker -- 0.25 packs/day    Types: Cigarettes  . Smokeless tobacco: Never Used  . Alcohol Use: No     Comment: ocassionally- not since pregnancy   . Drug Use: 7.00 per week    Special: Marijuana     Comment: Last used last week  . Sexual Activity: Yes    Birth Control/ Protection: None   Other Topics Concern  . Not on file   Social History Narrative  . No  narrative on file   No current facility-administered medications on file prior to encounter.   Current Outpatient Prescriptions on File Prior to Encounter  Medication Sig Dispense Refill  . ibuprofen (ADVIL,MOTRIN) 600 MG tablet Take 1 tablet (600 mg total) by mouth every 6 (six) hours as needed.  30 tablet  5  . Iron-FA-B Cmp-C-Biot-Probiotic (FUSION PLUS) CAPS Take 1 capsule by mouth daily before breakfast.  30 capsule  5  . oxyCODONE-acetaminophen (PERCOCET/ROXICET) 5-325 MG per tablet Take 1-2 tablets by mouth every 4 (four) hours as needed for severe pain (moderate - severe pain).  40 tablet  0  . Prenatal Vit-Fe Fumarate-FA (PRENATAL MULTIVITAMIN) TABS tablet Take 1 tablet by mouth daily at 12 noon.       No Known Allergies  ROS: Pertinent items in HPI  OBJECTIVE Blood pressure 169/101, pulse 67, temperature 98.5 F (36.9 C), temperature source Oral, resp. rate 18, last menstrual period 07/20/2012, SpO2 100.00%. GENERAL: Well-developed, well-nourished female in moderate distress.  HEENT: Normocephalic HEART: normal rate, rhythm, heart sounds RESP: normal effort, lung sounds clear and equal bilaterally ABDOMEN: Soft, non-tender EXTREMITIES: Nontender, no edema NEURO: Alert, oriented, normal speech, no focal findings or movement disorder noted, cranial nerves II through XII intact, DTR's normal and symmetric   LAB RESULTS Results for orders placed during the hospital encounter of 04/12/13 (from the  past 24 hour(s))  URINALYSIS, ROUTINE W REFLEX MICROSCOPIC     Status: Abnormal   Collection Time    04/12/13  1:46 PM      Result Value Range   Color, Urine STRAW (*) YELLOW   APPearance CLEAR  CLEAR   Specific Gravity, Urine 1.015  1.005 - 1.030   pH 7.5  5.0 - 8.0   Glucose, UA NEGATIVE  NEGATIVE mg/dL   Hgb urine dipstick MODERATE (*) NEGATIVE   Bilirubin Urine NEGATIVE  NEGATIVE   Ketones, ur NEGATIVE  NEGATIVE mg/dL   Protein, ur NEGATIVE  NEGATIVE mg/dL   Urobilinogen,  UA 0.2  0.0 - 1.0 mg/dL   Nitrite NEGATIVE  NEGATIVE   Leukocytes, UA NEGATIVE  NEGATIVE  URINE MICROSCOPIC-ADD ON     Status: Abnormal   Collection Time    04/12/13  1:46 PM      Result Value Range   Squamous Epithelial / LPF FEW (*) RARE   WBC, UA 0-2  <3 WBC/hpf   RBC / HPF 7-10  <3 RBC/hpf  CBC     Status: None   Collection Time    04/12/13  2:20 PM      Result Value Range   WBC 4.4  4.0 - 10.5 K/uL   RBC 3.95  3.87 - 5.11 MIL/uL   Hemoglobin 12.2  12.0 - 15.0 g/dL   HCT 16.1  09.6 - 04.5 %   MCV 92.9  78.0 - 100.0 fL   MCH 30.9  26.0 - 34.0 pg   MCHC 33.2  30.0 - 36.0 g/dL   RDW 40.9  81.1 - 91.4 %   Platelets 218  150 - 400 K/uL  COMPREHENSIVE METABOLIC PANEL     Status: Abnormal   Collection Time    04/12/13  2:20 PM      Result Value Range   Sodium 145  137 - 147 mEq/L   Potassium 4.0  3.7 - 5.3 mEq/L   Chloride 106  96 - 112 mEq/L   CO2 27  19 - 32 mEq/L   Glucose, Bld 99  70 - 99 mg/dL   BUN 7  6 - 23 mg/dL   Creatinine, Ser 7.82  0.50 - 1.10 mg/dL   Calcium 9.0  8.4 - 95.6 mg/dL   Total Protein 6.6  6.0 - 8.3 g/dL   Albumin 3.3 (*) 3.5 - 5.2 g/dL   AST 13  0 - 37 U/L   ALT 16  0 - 35 U/L   Alkaline Phosphatase 84  39 - 117 U/L   Total Bilirubin 0.3  0.3 - 1.2 mg/dL   GFR calc non Af Amer >90  >90 mL/min   GFR calc Af Amer >90  >90 mL/min    ASSESSMENT 1. Eclampsia, postpartum   2. Sudden onset of severe headache   3. PRES (posterior reversible encephalopathy syndrome)     PLAN Consult Dr Clearance Coots Admit to AICU Magnesium Sulfate 4 g bolus, 2g/hour Dilaudid 1-2 mg Q 3 hours PRN headache pain     Medication List    ASK your doctor about these medications       FUSION PLUS Caps  Take 1 capsule by mouth daily before breakfast.     ibuprofen 600 MG tablet  Commonly known as:  ADVIL,MOTRIN  Take 1 tablet (600 mg total) by mouth every 6 (six) hours as needed.     oxyCODONE-acetaminophen 5-325 MG per tablet  Commonly known as:  PERCOCET/ROXICET  Take 1-2 tablets by mouth every 4 (four) hours as needed for severe pain (moderate - severe pain).     prenatal multivitamin Tabs tablet  Take 1 tablet by mouth daily at 12 noon.         Sharen CounterLisa Leftwich-Kirby Certified Nurse-Midwife 04/12/2013  3:17 PM

## 2013-04-12 NOTE — H&P (Signed)
Alicia Castro is a 28 y.o. female presenting for preeclampsia. Maternal Medical History:  Reason for admission: 28 yo G5 P2.  Postpartum NSVD on 03-31-13.  Returned with eclamptic seizure on 04-09-13.  Treated and responded well and was discharged home on 04-11-13.  Noted onset of severe HA today and had elevated BP's on exam.    OB History   Grav Para Term Preterm Abortions TAB SAB Ect Mult Living   5 3 2 1 2 2    2      Past Medical History  Diagnosis Date  . UTI (lower urinary tract infection)     pt has h/o Acinetobacter in urine culture-requires Contact Isolation with each hodpitalization  . Chlamydia   . Gonorrhea   . Trichomonas   . Abnormal Pap smear   . Eclampsia    Past Surgical History  Procedure Laterality Date  . Wisdom tooth extraction    . Colposcopy     Family History: family history includes Cancer in her maternal grandmother. Social History:  reports that she has been smoking Cigarettes.  She has been smoking about 0.25 packs per day. She has never used smokeless tobacco. She reports that she uses illicit drugs (Marijuana) about 7 times per week. She reports that she does not drink alcohol.  Preeclamp  Review of Systems  Neurological: Positive for headaches.      Blood pressure 137/92, pulse 79, temperature 98.4 F (36.9 C), temperature source Oral, resp. rate 18, height 5\' 5"  (1.651 m), weight 197 lb 14.4 oz (89.767 kg), last menstrual period 07/20/2012, SpO2 100.00%. Exam Physical Exam  Nursing note and vitals reviewed. Constitutional: She is oriented to person, place, and time. She appears well-developed and well-nourished.  HENT:  Head: Normocephalic and atraumatic.  Eyes: Conjunctivae are normal. Pupils are equal, round, and reactive to light.  Neck: Normal range of motion. Neck supple.  Cardiovascular: Normal rate and regular rhythm.   Respiratory: Effort normal and breath sounds normal.  GI: Soft.  Musculoskeletal: Normal range of motion.   Neurological: She is alert and oriented to person, place, and time.  Skin: Skin is warm and dry.  Psychiatric: She has a normal mood and affect. Her behavior is normal. Judgment and thought content normal.    Prenatal labs: ABO, Rh: --/--/O POS (01/17 1500) Antibody: Negative (08/18 0000) Rubella: Immune (08/18 0000) RPR: NON REACTIVE (01/17 1500)  HBsAg: Negative (08/18 0000)  HIV: Non-reactive (07/31 0000)  GBS:     Assessment/Plan: Postpartum preeclampsia.  Stable.  Continue magnesium sulfate.   Alicia Castro A 04/12/2013, 11:51 PM

## 2013-04-12 NOTE — Consult Note (Signed)
Reason for Consult:Headache Referring Physician: Clearance Coots  CC: Headache  HPI: Alicia Castro is an 28 y.o. female discharged on 1/29 after being admitted with eclampsia and PRES changes noted on MRI.  Patient was noted to have one seizure and headache.  BP was elevated at admission.  At discharge patient had no further seizures and headache was improved.  Patient reports that since home has taken her blood pressure which was again elevated.  Headache returned today at about 10:30AM.  Remained steady until she took something just prior to coming back to the hospital.  She no longer has a headache.  Described the headache as being frontal and radiating along the top of her head to the occipital region.  Headache was throbbing and rated at a 10/10.    Past Medical History  Diagnosis Date  . UTI (lower urinary tract infection)     pt has h/o Acinetobacter in urine culture-requires Contact Isolation with each hodpitalization  . Chlamydia   . Gonorrhea   . Trichomonas   . Abnormal Pap smear   . Eclampsia     Past Surgical History  Procedure Laterality Date  . Wisdom tooth extraction    . Colposcopy      Family History  Problem Relation Age of Onset  . Cancer Maternal Grandmother     Social History:  reports that she has been smoking Cigarettes.  She has been smoking about 0.25 packs per day. She has never used smokeless tobacco. She reports that she uses illicit drugs (Marijuana) about 7 times per week. She reports that she does not drink alcohol.  No Known Allergies  Medications:  I have reviewed the patient's current medications. Prior to Admission:  Prescriptions prior to admission  Medication Sig Dispense Refill  . ibuprofen (ADVIL,MOTRIN) 600 MG tablet Take 1 tablet (600 mg total) by mouth every 6 (six) hours as needed.  30 tablet  5  . Iron-FA-B Cmp-C-Biot-Probiotic (FUSION PLUS) CAPS Take 1 capsule by mouth daily before breakfast.  30 capsule  5  . oxyCODONE-acetaminophen  (PERCOCET/ROXICET) 5-325 MG per tablet Take 1-2 tablets by mouth every 4 (four) hours as needed for severe pain (moderate - severe pain).  40 tablet  0  . Prenatal Vit-Fe Fumarate-FA (PRENATAL MULTIVITAMIN) TABS tablet Take 1 tablet by mouth daily at 12 noon.       Scheduled: . docusate sodium  100 mg Oral Daily  . prenatal multivitamin  1 tablet Oral Q1200    ROS: History obtained from the patient  General ROS: negative for - chills, fatigue, fever, night sweats, weight gain or weight loss Psychological ROS: negative for - behavioral disorder, hallucinations, memory difficulties, mood swings or suicidal ideation Ophthalmic ROS: negative for - blurry vision, double vision, eye pain or loss of vision ENT ROS: negative for - epistaxis, nasal discharge, oral lesions, sore throat, tinnitus or vertigo Allergy and Immunology ROS: negative for - hives or itchy/watery eyes Hematological and Lymphatic ROS: negative for - bleeding problems, bruising or swollen lymph nodes Endocrine ROS: negative for - galactorrhea, hair pattern changes, polydipsia/polyuria or temperature intolerance Respiratory ROS: negative for - cough, hemoptysis, shortness of breath or wheezing Cardiovascular ROS: negative for - chest pain, dyspnea on exertion, edema or irregular heartbeat Gastrointestinal ROS: negative for - abdominal pain, diarrhea, hematemesis, nausea/vomiting or stool incontinence Genito-Urinary ROS: negative for - dysuria, hematuria, incontinence or urinary frequency/urgency Musculoskeletal ROS: negative for - joint swelling or muscular weakness Neurological ROS: as noted in HPI Dermatological ROS: negative  for rash and skin lesion changes  Physical Examination: Blood pressure 137/92, pulse 79, temperature 98.4 F (36.9 C), temperature source Oral, resp. rate 18, height 5\' 5"  (1.651 m), weight 89.767 kg (197 lb 14.4 oz), last menstrual period 07/20/2012, SpO2 100.00%.  Neurologic Examination Mental  Status: Alert, oriented, thought content appropriate.  Speech fluent without evidence of aphasia.  Able to follow 3 step commands without difficulty. Cranial Nerves: II: Discs flat bilaterally; Visual fields grossly normal, pupils equal, round, reactive to light and accommodation III,IV, VI: ptosis not present, extra-ocular motions intact bilaterally V,VII: smile symmetric, facial light touch sensation normal bilaterally VIII: hearing normal bilaterally IX,X: gag reflex present XI: bilateral shoulder shrug XII: midline tongue extension Motor: Right : Upper extremity   5/5    Left:     Upper extremity   5/5  Lower extremity   5/5     Lower extremity   5/5 Tone and bulk:normal tone throughout; no atrophy noted Sensory: Pinprick and light touch intact throughout, bilaterally Deep Tendon Reflexes: 2+ and symmetric throughout Plantars: Right: downgoing   Left: downgoing Cerebellar: normal finger-to-nose and normal heel-to-shin test Gait: Unable to test CV: pulses palpable throughout    Laboratory Studies:   Basic Metabolic Panel:  Recent Labs Lab 04/08/13 1555 04/09/13 0530 04/12/13 1420  NA 145 141 145  K 3.5* 3.2* 4.0  CL 106 106 106  CO2 22 23 27   GLUCOSE 76 98 99  BUN 11 8 7   CREATININE 0.85 0.82 0.71  CALCIUM 9.1 7.9* 9.0    Liver Function Tests:  Recent Labs Lab 04/08/13 1555 04/09/13 0530 04/12/13 1420  AST 31 16 13   ALT 42* 27 16  ALKPHOS 107 83 84  BILITOT 0.3 0.3 0.3  PROT 7.3 5.7* 6.6  ALBUMIN 3.5 2.7* 3.3*   No results found for this basename: LIPASE, AMYLASE,  in the last 168 hours No results found for this basename: AMMONIA,  in the last 168 hours  CBC:  Recent Labs Lab 04/08/13 1555 04/09/13 0530 04/12/13 1420  WBC 5.0 8.4 4.4  HGB 13.7 12.3 12.2  HCT 39.9 36.2 36.7  MCV 94.3 91.4 92.9  PLT 292 300 218    Cardiac Enzymes: No results found for this basename: CKTOTAL, CKMB, CKMBINDEX, TROPONINI,  in the last 168 hours  BNP: No  components found with this basename: POCBNP,   CBG: No results found for this basename: GLUCAP,  in the last 168 hours  Microbiology: Results for orders placed during the hospital encounter of 04/08/13  MRSA PCR SCREENING     Status: None   Collection Time    04/09/13  2:00 AM      Result Value Range Status   MRSA by PCR NEGATIVE  NEGATIVE Final   Comment:            The GeneXpert MRSA Assay (FDA     approved for NASAL specimens     only), is one component of a     comprehensive MRSA colonization     surveillance program. It is not     intended to diagnose MRSA     infection nor to guide or     monitor treatment for     MRSA infections.    Coagulation Studies: No results found for this basename: LABPROT, INR,  in the last 72 hours  Urinalysis:  Recent Labs Lab 04/08/13 1656 04/12/13 1346  COLORURINE YELLOW STRAW*  LABSPEC 1.016 1.015  PHURINE 6.5 7.5  GLUCOSEU NEGATIVE  NEGATIVE  HGBUR SMALL* MODERATE*  BILIRUBINUR NEGATIVE NEGATIVE  KETONESUR 15* NEGATIVE  PROTEINUR 30* NEGATIVE  UROBILINOGEN 0.2 0.2  NITRITE NEGATIVE NEGATIVE  LEUKOCYTESUR NEGATIVE NEGATIVE    Lipid Panel:  No results found for this basename: chol, trig, hdl, cholhdl, vldl, ldlcalc    HgbA1C:  No results found for this basename: HGBA1C    Urine Drug Screen:     Component Value Date/Time   LABOPIA POSITIVE* 04/09/2013 0155   COCAINSCRNUR NEGATIVE 04/09/2013 0155   LABBENZ NEGATIVE 04/09/2013 0155   AMPHETMU NEGATIVE 04/09/2013 0155    Alcohol Level: No results found for this basename: ETH,  in the last 168 hours   Imaging: No results found.   Assessment/Plan: 28 year old female with recent diagnosis of eclampsia and PRES.  Returns hypertensive with recurrent headache.  Headache now resolved.  Previous imaging has a suggestion of venous anomalies.  With continued headache would like to rule this out completely.  Neurological examination remains non-focal.    Recommendations: 1.   MRV 2.  BP control  Thana FarrLeslie Shanecia Hoganson, MD [facility] [phone] 04/12/2013, 10:29 PM

## 2013-04-12 NOTE — Progress Notes (Signed)
To MRI at [facility] via [facility]

## 2013-04-13 MED ORDER — LABETALOL HCL 200 MG PO TABS
200.0000 mg | ORAL_TABLET | Freq: Three times a day (TID) | ORAL | Status: DC
  Administered 2013-04-13 – 2013-04-15 (×6): 200 mg via ORAL
  Filled 2013-04-13 (×10): qty 1

## 2013-04-13 NOTE — Progress Notes (Signed)
Received patient from [facility],

## 2013-04-13 NOTE — Progress Notes (Signed)
MRV brain negative. Please, call neurology with any questions or concerns.  Wyatt Portelasvaldo Haley Roza, MD [facility]

## 2013-04-13 NOTE — Progress Notes (Signed)
Post Partum Day 13 Subjective: no complaints  Objective: Blood pressure 132/82, pulse 110, temperature 98.3 F (36.8 C), temperature source Oral, resp. rate 18, height 5\' 5"  (1.651 m), weight 197 lb 14.4 oz (89.767 kg), last menstrual period 07/20/2012, SpO2 97.00%.  Physical Exam:  General: alert and no distress Lochia: appropriate Uterine Fundus: firm Incision: healing well DVT Evaluation: No evidence of DVT seen on physical exam.   Recent Labs  04/12/13 1420  HGB 12.2  HCT 36.7    Assessment/Plan: Postpartum preeclampsia.  Stable.  Continue magnesium sulfate.   LOS: 1 day   Alicia Castro A 04/13/2013, 11:15 AM

## 2013-04-14 NOTE — Progress Notes (Signed)
Post Partum Day 14 Subjective: no complaints  Objective: Blood pressure 129/79, pulse 94, temperature 98.9 F (37.2 C), temperature source Oral, resp. rate 16, height 5\' 5"  (1.651 m), weight 201 lb 1.6 oz (91.218 kg), last menstrual period 07/20/2012, SpO2 98.00%.  Physical Exam:  General: alert and no distress Lochia: appropriate Uterine Fundus: firm Incision: none DVT Evaluation: No evidence of DVT seen on physical exam.   Recent Labs  04/12/13 1420  HGB 12.2  HCT 36.7    Assessment/Plan: Postpartum preeclampsia.  Stable.  Continue magnesium sulfate.   LOS: 2 days   Alicia Castro A 04/14/2013, 8:49 PM

## 2013-04-14 NOTE — Discharge Instructions (Signed)
Acinetobacter Infection Acinetobacter is a group of bacteria that live in soil, water, and on the skin of healthy people. These bacteria can cause illness in people who have other diseases or conditions. CAUSES  Acinetobacter bacteria do not cause infection in healthy people. However, the bacteria may infect people who already have weak immune systems. People who are at risk of illness from Acinetobacter infection include people who have:  Diabetes.  Open wounds.  Lasting (chronic) lung disease.  Illnesses requiring long hospital stays (especially in an intensive care unit).  Dependence on a ventilator.  Cancer (and are receiving chemotherapy or radiation treatments).  HIV/AIDS.  Organ transplants. SYMPTOMS  Symptoms depend on where the infection develops. Possible types of infection and symptoms include:  Lung infection (pneumonia).  Fever.  Chills.  Cough.  Shortness of breath.  Wound infection.  Redness.  Hotness.  Increased pain.  Pus production.  Blood infection (sepsis).  Fever.  Chills.  Confusion.  Rash.  Joint pain.  Infection of the lining of the brain and spine (meningitis).  Headache.  Stiff neck.  Irritability.  Confusion.  Seizures.  Infected joints (septic arthritis).  Joint pain.  Stiffness.  Swelling.  Fever and chills. DIAGNOSIS  Acinetobacter infection is diagnosed by taking a sample of pus, mucus (sputum), or blood. The sample is examined in a lab for the presence of the bacteria. TREATMENT  Some types of Acinetobacter infection are treated with medicines that kill germs (antibiotics). In some cases, surgery will be needed to remove infected tissue. HOME CARE INSTRUCTIONS   Wash your hands carefully and frequently.  Regularly clean and disinfect all surfaces in your home. Use appropriate disinfectants.  Wash your hands before and after visiting people in the hospital.  Keep wounds covered with clean (sterile)  bandages.  Consider wearing sterile gloves when changing bandages. SEEK IMMEDIATE MEDICAL CARE IF:   You have an oral temperature above 102 F (38.9 C), not controlled by medicine.  You have a cough.  You are short of breath.  A wound develops new redness, hotness, or pus.  You have a severe headache or stiff neck.  One of your joints becomes red, swollen, stiff, or painful. Document Released: 02/11/2008 Document Revised: 05/23/2011 Document Reviewed: 02/11/2008 [facility] Patient Information 2014 [place], [place].   Managing Your High Blood Pressure Blood pressure is a measurement of how forceful your blood is pressing against the walls of the arteries. Arteries are muscular tubes within the circulatory system. Blood pressure does not stay the same. Blood pressure rises when you are active, excited, or nervous; and it lowers during sleep and relaxation. If the numbers measuring your blood pressure stay above normal most of the time, you are at risk for health problems. High blood pressure (hypertension) is a long-term (chronic) condition in which blood pressure is elevated. A blood pressure reading is recorded as two numbers, such as 120 over 80 (or 120/80). The first, higher number is called the systolic pressure. It is a measure of the pressure in your arteries as the heart beats. The second, lower number is called the diastolic pressure. It is a measure of the pressure in your arteries as the heart relaxes between beats.  Keeping your blood pressure in a normal range is important to your overall health and prevention of health problems, such as heart disease and stroke. When your blood pressure is uncontrolled, your heart has to work harder than normal. High blood pressure is a very common condition in adults because blood  pressure tends to rise with age. Men and women are equally likely to have hypertension but at different times in life. Before age 63, men are more likely to have  hypertension. After 28 years of age, women are more likely to have it. Hypertension is especially common in African Americans. This condition often has no signs or symptoms. The cause of the condition is usually not known. Your caregiver can help you come up with a plan to keep your blood pressure in a normal, healthy range. BLOOD PRESSURE STAGES Blood pressure is classified into four stages: normal, prehypertension, stage 1, and stage 2. Your blood pressure reading will be used to determine what type of treatment, if any, is necessary. Appropriate treatment options are tied to these four stages:  Normal  Systolic pressure (mm Hg): below 120.  Diastolic pressure (mm Hg): below 80. Prehypertension  Systolic pressure (mm Hg): 120 to 139.  Diastolic pressure (mm Hg): 80 to 89. Stage1  Systolic pressure (mm Hg): 140 to 159.  Diastolic pressure (mm Hg): 90 to 99. Stage2  Systolic pressure (mm Hg): 160 or above.  Diastolic pressure (mm Hg): 100 or above. RISKS RELATED TO HIGH BLOOD PRESSURE Managing your blood pressure is an important responsibility. Uncontrolled high blood pressure can lead to:  A heart attack.  A stroke.  A weakened blood vessel (aneurysm).  Heart failure.  Kidney damage.  Eye damage.  Metabolic syndrome.  Memory and concentration problems. HOW TO MANAGE YOUR BLOOD PRESSURE Blood pressure can be managed effectively with lifestyle changes and medicines (if needed). Your caregiver will help you come up with a plan to bring your blood pressure within a normal range. Your plan should include the following: Education  Read all information provided by your caregivers about how to control blood pressure.  Educate yourself on the latest guidelines and treatment recommendations. New research is always being done to further define the risks and treatments for high blood pressure. Lifestylechanges  Control your weight.  Avoid smoking.  Stay physically  active.  Reduce the amount of salt in your diet.  Reduce stress.  Control any chronic conditions, such as high cholesterol or diabetes.  Reduce your alcohol intake. Medicines  Several medicines (antihypertensive medicines) are available, if needed, to bring blood pressure within a normal range. Communication  Review all the medicines you take with your caregiver because there may be side effects or interactions.  Talk with your caregiver about your diet, exercise habits, and other lifestyle factors that may be contributing to high blood pressure.  See your caregiver regularly. Your caregiver can help you create and adjust your plan for managing high blood pressure. RECOMMENDATIONS FOR TREATMENT AND FOLLOW-UP  The following recommendations are based on current guidelines for managing high blood pressure in nonpregnant adults. Use these recommendations to identify the proper follow-up period or treatment option based on your blood pressure reading. You can discuss these options with your caregiver.  Systolic pressure of 120 to 139 or diastolic pressure of 80 to 89: Follow up with your caregiver as directed.  Systolic pressure of 140 to 160 or diastolic pressure of 90 to 100: Follow up with your caregiver within 2 months.  Systolic pressure above 160 or diastolic pressure above 100: Follow up with your caregiver within 1 month.  Systolic pressure above 180 or diastolic pressure above 110: Consider antihypertensive therapy; follow up with your caregiver within 1 week.  Systolic pressure above 200 or diastolic pressure above 120: Begin antihypertensive therapy; follow up  with your caregiver within 1 week. Document Released: 11/23/2011 Document Reviewed: 11/23/2011 [facility] Information 2014 [place].

## 2013-04-15 ENCOUNTER — Ambulatory Visit (HOSPITAL_COMMUNITY): Payer: No Typology Code available for payment source

## 2013-04-15 MED ORDER — SODIUM CHLORIDE 0.9 % IJ SOLN
3.0000 mL | Freq: Two times a day (BID) | INTRAMUSCULAR | Status: DC
  Administered 2013-04-15 (×2): 3 mL via INTRAVENOUS

## 2013-04-15 MED ORDER — LABETALOL HCL 300 MG PO TABS
300.0000 mg | ORAL_TABLET | Freq: Three times a day (TID) | ORAL | Status: DC
  Administered 2013-04-15 – 2013-04-16 (×3): 300 mg via ORAL
  Filled 2013-04-15 (×3): qty 1

## 2013-04-15 MED ORDER — LABETALOL HCL 100 MG PO TABS
100.0000 mg | ORAL_TABLET | Freq: Once | ORAL | Status: AC
  Administered 2013-04-15: 100 mg via ORAL
  Filled 2013-04-15: qty 1

## 2013-04-15 MED ORDER — SODIUM CHLORIDE 0.9 % IJ SOLN: 3.0000 mL | INTRAMUSCULAR | Status: DC | PRN

## 2013-04-15 NOTE — Progress Notes (Signed)
UR chart review completed.  

## 2013-04-15 NOTE — Progress Notes (Signed)
Patient ID: Alicia Castro, female   DOB: 1985/08/21, 28 y.o.   MRN: [id number] Subjective: No complaints  Objective: Vital signs in last 24 hours: Temp:  [98.2 F (36.8 C)-98.9 F (37.2 C)] 98.9 F (37.2 C) (02/02 0751) Pulse Rate:  [72-98] 73 (02/02 0800) Resp:  [16-18] 16 (02/02 0809) BP: (118-152)/(72-103) 140/87 mmHg (02/02 0800) SpO2:  [97 %-100 %] 99 % (02/02 0800) Weight:  [191 lb 4.8 oz (86.773 kg)-201 lb 1.6 oz (91.218 kg)] 195 lb 9.6 oz (88.724 kg) (02/02 0809)  Intake/Output from previous day: 02/01 0701 - 02/02 0700 In: 4315 [P.O.:1440; I.V.:2875] Out: 5200 [Urine:5200] Intake/Output this shift: Total I/O In: 250 [I.V.:250] Out: -   General appearance: alert Abdomen: soft, non-tender; bowel sounds normal; no masses,  no organomegaly Extremities: extremities normal, atraumatic, no cyanosis or edema Neurologic: Grossly normal     Scheduled Meds: . docusate sodium  100 mg Oral Daily  . labetalol  200 mg Oral Q8H  . prenatal multivitamin  1 tablet Oral Q1200   Continuous Infusions: . lactated ringers 125 mL/hr at 04/12/13 1700  . lactated ringers 100 mL/hr at 04/15/13 0741  . magnesium sulfate 2 g/hr (04/15/13 0800)   PRN Meds:acetaminophen, calcium carbonate, HYDROmorphone, promethazine, zolpidem  Assessment/Plan: Principal Problem:   PRES (posterior reversible encephalopathy syndrome)   -->D/C MgSO4 -->transfer to floor   LOS: 3 days   JACKSON-MOORE,Kayon Dozier A

## 2013-04-16 MED ORDER — TRIAMTERENE-HCTZ 50-25 MG PO CAPS: 1.0000 | ORAL_CAPSULE | ORAL | Status: DC

## 2013-04-16 MED ORDER — LABETALOL HCL 300 MG PO TABS: 300.0000 mg | ORAL_TABLET | Freq: Three times a day (TID) | ORAL | Status: DC

## 2013-04-16 MED ORDER — OXYCODONE-ACETAMINOPHEN 5-325 MG PO TABS
1.0000 | ORAL_TABLET | ORAL | Status: DC | PRN
Start: 2013-04-16 — End: 2013-06-28

## 2013-04-16 NOTE — Discharge Summary (Signed)
Physician Discharge Summary  Patient ID: Alicia Castro L Daudelin MRN: [id number] DOB/AGE: [birth year] 28 y.o.  Admit date: 04/12/2013 Discharge date: 04/16/2013  Admission Diagnoses:  Discharge Diagnoses:  Principal Problem:   PRES (posterior reversible encephalopathy syndrome)   Discharged Condition: good  Hospital Course: Admitted with elevated BP and HA.  Responded well to therapy.  Discharged home in good condition.  Consults: neurology  Significant Diagnostic Studies: labs: CBC, CMET  Treatments: Labetalol, Magnesium Sulfate  Discharge Exam: Blood pressure 148/89, pulse 75, temperature 98.6 F (37 C), temperature source Oral, resp. rate 16, height 5\' 5"  (1.651 m), weight 196 lb 12 oz (89.245 kg), SpO2 98.00%. General appearance: alert and no distress Resp: clear to auscultation bilaterally Cardio: regular rate and rhythm, S1, S2 normal, no murmur, click, rub or gallop GI: normal findings: soft, non-tender Extremities: extremities normal, atraumatic, no cyanosis or edema  Disposition: 01-Home or Self Care  Discharge Orders   Future Orders Complete By Expires   Diet - low sodium heart healthy  As directed        Medication List         FUSION PLUS Caps  Take 1 capsule by mouth daily before breakfast.     ibuprofen 600 MG tablet  Commonly known as:  ADVIL,MOTRIN  Take 1 tablet (600 mg total) by mouth every 6 (six) hours as needed.     oxyCODONE-acetaminophen 5-325 MG per tablet  Commonly known as:  PERCOCET/ROXICET  Take 1-2 tablets by mouth every 4 (four) hours as needed for severe pain (moderate - severe pain).     prenatal multivitamin Tabs tablet  Take 1 tablet by mouth daily at 12 noon.           Follow-up Information   Follow up with Antionette CharJACKSON-MOORE,LISA A, MD. Schedule an appointment as soon as possible for a visit in 1 week.   Specialty:  Obstetrics and Gynecology   Contact information:   [address] [place]  [id number] [phone]       Signed: Brock BadHARPER,Kadarrius Yanke A 04/16/2013, 9:17 AM

## 2013-04-16 NOTE — Progress Notes (Signed)
Post Partum Day 16 Subjective: no complaints  Objective: Blood pressure 148/89, pulse 75, temperature 98.6 F (37 C), temperature source Oral, resp. rate 16, height 5\' 5"  (1.651 m), weight 196 lb 12 oz (89.245 kg), SpO2 98.00%.  Physical Exam:  General: alert and no distress Lochia: appropriate Uterine Fundus: firm Incision: none DVT Evaluation: No evidence of DVT seen on physical exam.  No results found for this basename: HGB, HCT,  in the last 72 hours  Assessment/Plan: Discharge home.  Stable BP.  No HA or seizure activity.  Labetalol Rx.   LOS: 4 days   [name],[name] 04/16/2013, 9:09 AM

## 2013-04-16 NOTE — Progress Notes (Signed)
Pt is discharged in the care of friend,with R.N. Escort. Pt denies anymore pain or discomfort. States medication given earlier relieved discomfort. Discharge instructions with RX were given to pt. Pt states she understands how to take  Medication and proper dietary  Mangagement. Stable.

## 2013-04-18 ENCOUNTER — Ambulatory Visit (HOSPITAL_COMMUNITY): Payer: No Typology Code available for payment source

## 2013-04-18 ENCOUNTER — Other Ambulatory Visit (HOSPITAL_COMMUNITY): Payer: Medicaid Other

## 2013-05-02 ENCOUNTER — Ambulatory Visit (INDEPENDENT_AMBULATORY_CARE_PROVIDER_SITE_OTHER): Payer: Medicaid Other | Admitting: Obstetrics

## 2013-05-02 ENCOUNTER — Encounter: Payer: Self-pay | Admitting: Obstetrics

## 2013-05-02 VITALS — BP 105/73 | HR 80 | Temp 98.2°F | Ht 65.0 in | Wt 194.0 lb

## 2013-05-02 DIAGNOSIS — O152 Eclampsia in the puerperium: Secondary | ICD-10-CM

## 2013-05-02 NOTE — Progress Notes (Signed)
Subjective:     Alicia Castro is a 28 y.o. female here for a routine exam.  Current complaints: Patient in office today for follow up visit for Blood Pressure. Patient states she was in the Hospital from the 23-26 of the January and then the 28-3 of February. Patient states her upper back is hurting. Patient states she cannot stand up for a long time without one of her legs hurting.  Personal health questionnaire reviewed: yes.   Gynecologic History No LMP recorded. Contraception: none  Obstetric History OB History  Gravida Para Term Preterm AB SAB TAB Ectopic Multiple Living  5 3 2 1 2  2   2     # Outcome Date GA Lbr Len/2nd Weight Sex Delivery Anes PTL Lv  5 PRE 03/31/13 3762w2d 22:46 / 00:35 5 lb 6.4 oz (2.45 kg) F SVD EPI  Y  4 TRM 2008        SB  3 TRM 2005        Y  2 TAB           1 TAB                The following portions of the patient's history were reviewed and updated as appropriate: allergies, current medications, past family history, past medical history, past social history, past surgical history and problem list.  Review of Systems Pertinent items are noted in HPI.    Objective:    No exam performed today, Patient not seen by physician..    Assessment:    H/O Eclampsia.  Doing well postpartum.  She is Dr. Elsie StainMarshall's patient and was erroneously given an appointment here for F/U.   Plan:    F/U with Dr. Gaynell FaceMarshall.

## 2013-05-13 ENCOUNTER — Other Ambulatory Visit (HOSPITAL_COMMUNITY): Payer: Self-pay | Admitting: Obstetrics

## 2013-05-20 ENCOUNTER — Encounter: Payer: Self-pay | Admitting: Obstetrics & Gynecology

## 2013-05-20 ENCOUNTER — Ambulatory Visit (INDEPENDENT_AMBULATORY_CARE_PROVIDER_SITE_OTHER): Payer: Medicaid Other | Admitting: Obstetrics & Gynecology

## 2013-05-20 VITALS — BP 112/71 | HR 81 | Temp 97.1°F | Ht 65.0 in | Wt 196.0 lb

## 2013-05-20 DIAGNOSIS — Z01419 Encounter for gynecological examination (general) (routine) without abnormal findings: Secondary | ICD-10-CM

## 2013-05-20 DIAGNOSIS — Z0000 Encounter for general adult medical examination without abnormal findings: Secondary | ICD-10-CM

## 2013-05-20 NOTE — Progress Notes (Signed)
Subjective:     Alicia Castro is a 28 y.o. female here for a routine exam.    Personal health questionnaire reviewed: no.   Gynecologic History No LMP recorded.   Obstetric History OB History  Gravida Para Term Preterm AB SAB TAB Ectopic Multiple Living  5 3 2 1 2  2   2     # Outcome Date GA Lbr Len/2nd Weight Sex Delivery Anes PTL Lv  5 PRE 03/31/13 2929w2d 22:46 / 00:35 5 lb 6.4 oz (2.45 kg) F SVD EPI  Y  4 TRM 2008        SB  3 TRM 2005        Y  2 TAB           1 TAB                The following portions of the patient's history were reviewed and updated as appropriate: allergies, current medications, past family history, past medical history, past social history, past surgical history and problem list.  Review of Systems Pertinent items are noted in HPI.    Objective:   General:   alert  Skin:   no rash or abnormalities  Lungs:   clear to auscultation bilaterally  Heart:   regular rate and rhythm, S1, S2 normal, no murmur, click, rub or gallop  Breasts:   normal without suspicious masses, skin or nipple changes or axillary nodes  Abdomen:  normal findings: no organomegaly, soft, non-tender and no hernia  Pelvis:  External genitalia: normal general appearance Urinary system: urethral meatus normal and bladder without fullness, nontender Vaginal: normal without tenderness, induration or masses Cervix: normal appearance Adnexa: normal bimanual exam Uterus: anteverted and non-tender, normal size    Assessment:    Healthy female exam.    Plan:    Education reviewed: calcium supplements, low fat, low cholesterol diet and weight bearing exercise.   Return prn

## 2013-06-28 ENCOUNTER — Emergency Department (HOSPITAL_COMMUNITY)
Admission: EM | Admit: 2013-06-28 | Discharge: 2013-06-28 | Disposition: A | Discharge status: Home / Self Care | Payer: Medicaid Other | Attending: Emergency Medicine | Admitting: Emergency Medicine

## 2013-06-28 ENCOUNTER — Encounter (HOSPITAL_COMMUNITY): Payer: Self-pay | Admitting: Emergency Medicine

## 2013-06-28 DIAGNOSIS — F17200 Nicotine dependence, unspecified, uncomplicated: Secondary | ICD-10-CM | POA: Insufficient documentation

## 2013-06-28 DIAGNOSIS — Z87440 Personal history of urinary (tract) infections: Secondary | ICD-10-CM | POA: Insufficient documentation

## 2013-06-28 DIAGNOSIS — B9689 Other specified bacterial agents as the cause of diseases classified elsewhere: Secondary | ICD-10-CM | POA: Insufficient documentation

## 2013-06-28 DIAGNOSIS — K5900 Constipation, unspecified: Secondary | ICD-10-CM | POA: Insufficient documentation

## 2013-06-28 DIAGNOSIS — A599 Trichomoniasis, unspecified: Secondary | ICD-10-CM

## 2013-06-28 DIAGNOSIS — N760 Acute vaginitis: Secondary | ICD-10-CM

## 2013-06-28 DIAGNOSIS — R109 Unspecified abdominal pain: Secondary | ICD-10-CM

## 2013-06-28 DIAGNOSIS — A499 Bacterial infection, unspecified: Secondary | ICD-10-CM | POA: Insufficient documentation

## 2013-06-28 DIAGNOSIS — Z3202 Encounter for pregnancy test, result negative: Secondary | ICD-10-CM | POA: Insufficient documentation

## 2013-06-28 DIAGNOSIS — N739 Female pelvic inflammatory disease, unspecified: Secondary | ICD-10-CM

## 2013-06-28 LAB — URINALYSIS, ROUTINE W REFLEX MICROSCOPIC
Bilirubin Urine: NEGATIVE
GLUCOSE, UA: NEGATIVE mg/dL
KETONES UR: 15 mg/dL — AB
Nitrite: NEGATIVE
Protein, ur: NEGATIVE mg/dL
Specific Gravity, Urine: 1.011 (ref 1.005–1.030)
Urobilinogen, UA: 0.2 mg/dL (ref 0.0–1.0)
pH: 6.5 (ref 5.0–8.0)

## 2013-06-28 LAB — URINE MICROSCOPIC-ADD ON

## 2013-06-28 LAB — WET PREP, GENITAL: YEAST WET PREP: NONE SEEN

## 2013-06-28 LAB — POC URINE PREG, ED: Preg Test, Ur: NEGATIVE

## 2013-06-28 MED ORDER — DOXYCYCLINE HYCLATE 100 MG PO CAPS: 100.0000 mg | ORAL_CAPSULE | Freq: Two times a day (BID) | ORAL | Status: DC

## 2013-06-28 MED ORDER — LIDOCAINE HCL (PF) 1 % IJ SOLN
INTRAMUSCULAR | Status: DC
Start: 2013-06-28 — End: 2013-06-28
  Filled 2013-06-28: qty 5

## 2013-06-28 MED ORDER — ACETAMINOPHEN 325 MG PO TABS
650.0000 mg | ORAL_TABLET | Freq: Once | ORAL | Status: AC
  Administered 2013-06-28: 650 mg via ORAL
  Filled 2013-06-28: qty 2

## 2013-06-28 MED ORDER — CEFTRIAXONE SODIUM 250 MG IJ SOLR
250.0000 mg | Freq: Once | INTRAMUSCULAR | Status: AC
  Administered 2013-06-28: 250 mg via INTRAMUSCULAR
  Filled 2013-06-28: qty 250

## 2013-06-28 MED ORDER — METRONIDAZOLE 500 MG PO TABS: 500.0000 mg | ORAL_TABLET | Freq: Two times a day (BID) | ORAL | Status: DC

## 2013-06-28 MED ORDER — ONDANSETRON 4 MG PO TBDP
8.0000 mg | ORAL_TABLET | Freq: Once | ORAL | Status: AC
Start: 2013-06-28 — End: 2013-06-28
  Administered 2013-06-28: 8 mg via ORAL
  Filled 2013-06-28: qty 2

## 2013-06-28 NOTE — ED Notes (Signed)
Dr. Bebe ShaggyWickline at bedside performing pelvic examination

## 2013-06-28 NOTE — Discharge Instructions (Signed)

## 2013-06-28 NOTE — ED Notes (Signed)
Sudden onset abdominal pain initially R side and now moved across front of abdomen.  Pt states she thought it was gas or constipation and she took laxatives.  Pt reports last BM this morning.  Pt also reports nausea.

## 2013-06-28 NOTE — ED Provider Notes (Signed)
CSN: [id number]     Arrival date & time 06/28/13  0909 History   First MD Initiated Contact with Patient 06/28/13 0912     Chief Complaint  Patient presents with  . Abdominal Pain      Patient is a 28 y.o. female presenting with abdominal pain. The history is provided by the patient.  Abdominal Pain Pain location:  Generalized Pain quality: aching and cramping   Pain radiates to:  Does not radiate Pain severity:  Moderate Onset quality:  Gradual Duration:  2 days Timing:  Intermittent Progression:  Worsening Chronicity:  New Relieved by:  Nothing Worsened by:  Nothing tried Associated symptoms: constipation and nausea   Associated symptoms: no cough, no dysuria, no fever, no vaginal bleeding, no vaginal discharge and no vomiting   pt reports she has been constipated but after arrival to the ER she had an episode of diarrhea  She is s/p NSVD earlier this year, no complications She is not breastfeeding  Past Medical History  Diagnosis Date  . UTI (lower urinary tract infection)     pt has h/o Acinetobacter in urine culture-requires Contact Isolation with each hodpitalization  . Chlamydia   . Gonorrhea   . Trichomonas   . Abnormal Pap smear   . Eclampsia    Past Surgical History  Procedure Laterality Date  . Wisdom tooth extraction    . Colposcopy     Family History  Problem Relation Age of Onset  . Cancer Maternal Grandmother    History  Substance Use Topics  . Smoking status: Current Every Day Smoker -- 0.25 packs/day    Types: Cigarettes  . Smokeless tobacco: Never Used  . Alcohol Use: Yes     Comment: ocassionally   OB History   Grav Para Term Preterm Abortions TAB SAB Ect Mult Living   5 3 2 1 2 2    2      Review of Systems  Constitutional: Negative for fever.  Respiratory: Negative for cough.   Gastrointestinal: Positive for nausea, abdominal pain and constipation. Negative for vomiting.  Genitourinary: Negative for dysuria, vaginal bleeding and  vaginal discharge.  All other systems reviewed and are negative.     Allergies  Review of patient's allergies indicates no known allergies.  Home Medications   Prior to Admission medications   Medication Sig Start Date End Date Taking? Authorizing Provider  ibuprofen (ADVIL,MOTRIN) 600 MG tablet Take 1 tablet (600 mg total) by mouth every 6 (six) hours as needed. 04/02/13   Brock Badharles A [name], MD  Iron-FA-B Cmp-C-Biot-Probiotic (FUSION PLUS) CAPS Take 1 capsule by mouth daily before breakfast. 04/02/13   Brock Badharles A [name], MD  labetalol (NORMODYNE) 300 MG tablet Take 1 tablet (300 mg total) by mouth every 8 (eight) hours. 04/16/13   Brock Badharles A [name], MD  oxyCODONE-acetaminophen (PERCOCET/ROXICET) 5-325 MG per tablet Take 1-2 tablets by mouth every 4 (four) hours as needed for severe pain (moderate - severe pain). 04/16/13   Brock Badharles A [name], MD  Prenatal Vit-Fe Fumarate-FA (PRENATAL MULTIVITAMIN) TABS tablet Take 1 tablet by mouth daily at 12 noon.    Historical Provider, MD  triamterene-hydrochlorothiazide (DYAZIDE) 50-25 MG per capsule Take 1 capsule by mouth every morning. 04/16/13   Brock Badharles A [name], MD   BP 112/64  Pulse 88  Temp(Src) 97.8 F (36.6 C) (Oral)  Resp 18  Ht 5\' 5"  (1.651 m)  Wt 192 lb (87.091 kg)  BMI 31.95 kg/m2  SpO2 100%  LMP 06/28/2013 Physical Exam CONSTITUTIONAL:  Well developed/well nourished HEAD: Normocephalic/atraumatic EYES: EOMI/PERRL ENMT: Mucous membranes moist NECK: supple no meningeal signs SPINE:entire spine nontender CV: S1/S2 noted, no murmurs/rubs/gallops noted LUNGS: Lungs are clear to auscultation bilaterally, no apparent distress ABDOMEN: soft, diffuse moderate tenderness, no rebound or guarding GU:no cva tenderness. +CMT, bilateral adnexal tenderness, vag discharge noted, no bleeding, female chaperone present NEURO: Pt is awake/alert, moves all extremitiesx4 EXTREMITIES: pulses normal, full ROM SKIN: warm, color normal PSYCH: no abnormalities  of mood noted  ED Course  Procedures Labs Review Labs Reviewed  WET PREP, GENITAL - Abnormal; Notable for the following:    Trich, Wet Prep MODERATE (*)    Clue Cells Wet Prep HPF POC MANY (*)    WBC, Wet Prep HPF POC FEW (*)    All other components within normal limits  URINALYSIS, ROUTINE W REFLEX MICROSCOPIC - Abnormal; Notable for the following:    Hgb urine dipstick TRACE (*)    Ketones, ur 15 (*)    Leukocytes, UA TRACE (*)    All other components within normal limits  URINE MICROSCOPIC-ADD ON - Abnormal; Notable for the following:    Squamous Epithelial / LPF FEW (*)    Bacteria, UA FEW (*)    All other components within normal limits  GC/CHLAMYDIA PROBE AMP  POC URINE PREG, ED    Pt improved Using phone, watching TV, no distress abd soft and no focal tenderness Given her pelvic exam suspicious for PID Less likely acute appendicitis/diverticulitis I doubt TOA/torsion  She reports she is sexually active Discussed safe sex precautions and need to avoid sexual intercourse until all tests negative and completed treatment  BP 106/67  Pulse 91  Temp(Src) 97.8 F (36.6 C) (Oral)  Resp 18  Ht 5\' 5"  (1.651 m)  Wt 192 lb (87.091 kg)  BMI 31.95 kg/m2  SpO2 100%  LMP 06/28/2013    MDM   Final diagnoses:  Abdominal pain  Pelvic inflammatory disease  Trichomonas infection  BV (bacterial vaginosis)    Nursing notes including past medical history and social history reviewed and considered in documentation Labs/vital reviewed and considered     Joya Gaskinsonald W Rishard Delange, MD 06/28/13 1242

## 2013-06-29 LAB — GC/CHLAMYDIA PROBE AMP
CT Probe RNA: NEGATIVE
GC Probe RNA: POSITIVE — AB

## 2013-06-30 ENCOUNTER — Telehealth (HOSPITAL_BASED_OUTPATIENT_CLINIC_OR_DEPARTMENT_OTHER): Payer: Self-pay | Admitting: Emergency Medicine

## 2013-06-30 NOTE — Telephone Encounter (Signed)
+  Gonorrhea. Patient treated with Rocephin and Doxycycline. [employer] faxed. 

## 2013-07-01 NOTE — Telephone Encounter (Signed)
Patient informed of positive results after id'd x 2 and informed of need to notify partner to be treated. 

## 2013-08-06 ENCOUNTER — Encounter (HOSPITAL_COMMUNITY): Payer: Self-pay | Admitting: Emergency Medicine

## 2013-08-06 ENCOUNTER — Emergency Department (HOSPITAL_COMMUNITY)
Admission: EM | Admit: 2013-08-06 | Discharge: 2013-08-06 | Disposition: A | Discharge status: Home / Self Care | Payer: Medicaid Other | Attending: Emergency Medicine | Admitting: Emergency Medicine

## 2013-08-06 DIAGNOSIS — Z87440 Personal history of urinary (tract) infections: Secondary | ICD-10-CM | POA: Insufficient documentation

## 2013-08-06 DIAGNOSIS — M549 Dorsalgia, unspecified: Secondary | ICD-10-CM

## 2013-08-06 DIAGNOSIS — M545 Low back pain, unspecified: Secondary | ICD-10-CM | POA: Insufficient documentation

## 2013-08-06 DIAGNOSIS — F17200 Nicotine dependence, unspecified, uncomplicated: Secondary | ICD-10-CM | POA: Insufficient documentation

## 2013-08-06 DIAGNOSIS — Z8619 Personal history of other infectious and parasitic diseases: Secondary | ICD-10-CM | POA: Insufficient documentation

## 2013-08-06 LAB — COMPREHENSIVE METABOLIC PANEL
ALBUMIN: 4 g/dL (ref 3.5–5.2)
ALK PHOS: 56 U/L (ref 39–117)
ALT: 10 U/L (ref 0–35)
AST: 14 U/L (ref 0–37)
BUN: 15 mg/dL (ref 6–23)
CALCIUM: 9.4 mg/dL (ref 8.4–10.5)
CO2: 25 mEq/L (ref 19–32)
Chloride: 104 mEq/L (ref 96–112)
Creatinine, Ser: 0.96 mg/dL (ref 0.50–1.10)
GFR calc Af Amer: >90 mL/min (ref >90)
GFR calc non Af Amer: 80 mL/min — ABNORMAL LOW (ref >90)
Glucose, Bld: 86 mg/dL (ref 70–99)
POTASSIUM: 4.1 meq/L (ref 3.7–5.3)
SODIUM: 141 meq/L (ref 137–147)
TOTAL PROTEIN: 7.4 g/dL (ref 6.0–8.3)
Total Bilirubin: 0.3 mg/dL (ref 0.3–1.2)

## 2013-08-06 LAB — URINALYSIS, ROUTINE W REFLEX MICROSCOPIC
Bilirubin Urine: NEGATIVE
Glucose, UA: NEGATIVE mg/dL
Hgb urine dipstick: NEGATIVE
Ketones, ur: NEGATIVE mg/dL
LEUKOCYTES UA: NEGATIVE
Nitrite: NEGATIVE
PROTEIN: NEGATIVE mg/dL
Specific Gravity, Urine: 1.03 (ref 1.005–1.030)
UROBILINOGEN UA: 1 mg/dL (ref 0.0–1.0)
pH: 6 (ref 5.0–8.0)

## 2013-08-06 LAB — CBC
HCT: 40.5 % (ref 36.0–46.0)
Hemoglobin: 13.5 g/dL (ref 12.0–15.0)
MCH: 31.7 pg (ref 26.0–34.0)
MCHC: 33.3 g/dL (ref 30.0–36.0)
MCV: 95.1 fL (ref 78.0–100.0)
Platelets: 209 10*3/uL (ref 150–400)
RBC: 4.26 MIL/uL (ref 3.87–5.11)
RDW: 15 % (ref 11.5–15.5)
WBC: 5.9 10*3/uL (ref 4.0–10.5)

## 2013-08-06 LAB — LIPASE, BLOOD: LIPASE: 30 U/L (ref 11–59)

## 2013-08-06 LAB — POC URINE PREG, ED: PREG TEST UR: NEGATIVE

## 2013-08-06 MED ORDER — KETOROLAC TROMETHAMINE 30 MG/ML IJ SOLN
30.0000 mg | Freq: Once | INTRAMUSCULAR | Status: AC
  Administered 2013-08-06: 30 mg via INTRAVENOUS
  Filled 2013-08-06: qty 1

## 2013-08-06 MED ORDER — IBUPROFEN 600 MG PO TABS: 600.0000 mg | ORAL_TABLET | Freq: Four times a day (QID) | ORAL | Status: DC | PRN

## 2013-08-06 MED ORDER — KETOROLAC TROMETHAMINE 60 MG/2ML IM SOLN: 60.0000 mg | Freq: Once | INTRAMUSCULAR | Status: DC

## 2013-08-06 MED ORDER — HYDROCODONE-ACETAMINOPHEN 5-325 MG PO TABS: 1.0000 | ORAL_TABLET | Freq: Four times a day (QID) | ORAL | Status: DC | PRN

## 2013-08-06 NOTE — Discharge Instructions (Signed)
Back Pain, Adult  Given that your pain is over her epidural site, you should follow up with your M.D. and/or anesthesiology. You have no evidence of cord disruption. If you have any new symptoms including numbness, weakness of the lower extremities, difficulty urinating.  Low back pain is very common. About 1 in 5 people have back pain.The cause of low back pain is rarely dangerous. The pain often gets better over time.About half of people with a sudden onset of back pain feel better in just 2 weeks. About 8 in 10 people feel better by 6 weeks.  CAUSES Some common causes of back pain include:  Strain of the muscles or ligaments supporting the spine.  Wear and tear (degeneration) of the spinal discs.  Arthritis.  Direct injury to the back. DIAGNOSIS Most of the time, the direct cause of low back pain is not known.However, back pain can be treated effectively even when the exact cause of the pain is unknown.Answering your caregiver's questions about your overall health and symptoms is one of the most accurate ways to make sure the cause of your pain is not dangerous. If your caregiver needs more information, he or she may order lab work or imaging tests (X-rays or MRIs).However, even if imaging tests show changes in your back, this usually does not require surgery. HOME CARE INSTRUCTIONS For many people, back pain returns.Since low back pain is rarely dangerous, it is often a condition that people can learn to [facility] their own.   Remain active. It is stressful on the back to sit or stand in one place. Do not sit, drive, or stand in one place for more than 30 minutes at a time. Take short walks on level surfaces as soon as pain allows.Try to increase the length of time you walk each day.  Do not stay in bed.Resting more than 1 or 2 days can delay your recovery.  Do not avoid exercise or work.Your body is made to move.It is not dangerous to be active, even though your back may  hurt.Your back will likely heal faster if you return to being active before your pain is gone.  Pay attention to your body when you bend and lift. Many people have less discomfortwhen lifting if they bend their knees, keep the load close to their bodies,and avoid twisting. Often, the most comfortable positions are those that put less stress on your recovering back.  Find a comfortable position to sleep. Use a firm mattress and lie on your side with your knees slightly bent. If you lie on your back, put a pillow under your knees.  Only take over-the-counter or prescription medicines as directed by your caregiver. Over-the-counter medicines to reduce pain and inflammation are often the most helpful.Your caregiver may prescribe muscle relaxant drugs.These medicines help dull your pain so you can more quickly return to your normal activities and healthy exercise.  Put ice on the injured area.  Put ice in a plastic bag.  Place a towel between your skin and the bag.  Leave the ice on for 15-20 minutes, 03-04 times a day for the first 2 to 3 days. After that, ice and heat may be alternated to reduce pain and spasms.  Ask your caregiver about trying back exercises and gentle massage. This may be of some benefit.  Avoid feeling anxious or stressed.Stress increases muscle tension and can worsen back pain.It is important to recognize when you are anxious or stressed and learn ways to manage it.Exercise is a  great option. SEEK MEDICAL CARE IF:  You have pain that is not relieved with rest or medicine.  You have pain that does not improve in 1 week.  You have new symptoms.  You are generally not feeling well. SEEK IMMEDIATE MEDICAL CARE IF:   You have pain that radiates from your back into your legs.  You develop new bowel or bladder control problems.  You have unusual weakness or numbness in your arms or legs.  You develop nausea or vomiting.  You develop abdominal pain.  You  feel faint. Document Released: 02/28/2005 Document Revised: 08/30/2011 Document Reviewed: 07/19/2010 [facility] Patient Information 2014 [place], [place].

## 2013-08-06 NOTE — ED Notes (Signed)
Pt states the pain in her back always comes when she has a UTI. She denies any burning with urination and states she always has to frequently urinate

## 2013-08-06 NOTE — ED Notes (Signed)
The patient said she started having back pain on and off on Friday.  By Sunday it was a constant back pain and it would not go away.  Patient has recurrent UTIs and thinks she has one now even though she denies any urinary symptoms.  Patient rates her pain 8/10.  Her pain got worse so she decided to come be evaluated.

## 2013-08-07 NOTE — ED Provider Notes (Signed)
CSN: [id number]     Arrival date & time 08/06/13  1151 History   First MD Initiated Contact with Patient 08/06/13 1217     Chief Complaint  Patient presents with  . Flank Pain    Patient has recurrent UTIs and thinks she has one now even though she denies any urinary symptoms     (Consider location/radiation/quality/duration/timing/severity/associated sxs/prior Treatment) HPI  This is a 28 yo with history of UTI who presents with back pain.  History of similar and feels this is consistent with prior UTIs.  However, denies dysuria or urinary symptoms.  Worsening pain since Friday.  Pain currently 8/10.  Nothing makes better or worse.  Tylenol doesn't help.  Denies any injury.  Pain is achy, midline and nonradiating.  Patient does not that she has had similar pain since epidural placement 5 months ago.  Denies any urinary retention, weakness, numbness or tingling of the legs.  Past Medical History  Diagnosis Date  . UTI (lower urinary tract infection)     pt has h/o Acinetobacter in urine culture-requires Contact Isolation with each hodpitalization  . Chlamydia   . Gonorrhea   . Trichomonas   . Abnormal Pap smear   . Eclampsia    Past Surgical History  Procedure Laterality Date  . Wisdom tooth extraction    . Colposcopy     Family History  Problem Relation Age of Onset  . Cancer Maternal Grandmother    History  Substance Use Topics  . Smoking status: Current Every Day Smoker -- 0.25 packs/day    Types: Cigarettes  . Smokeless tobacco: Never Used  . Alcohol Use: Yes     Comment: ocassionally   OB History   Grav Para Term Preterm Abortions TAB SAB Ect Mult Living   5 3 2 1 2 2    2      Review of Systems  Constitutional: Negative for fever.  Respiratory: Negative for shortness of breath.   Cardiovascular: Negative for chest pain.  Gastrointestinal: Negative for abdominal pain.  Genitourinary: Negative for dysuria.       Denies retention  Musculoskeletal: Positive for  back pain. Negative for gait problem.  Skin: Negative for wound.  Neurological: Negative for weakness and numbness.  All other systems reviewed and are negative.     Allergies  Review of patient's allergies indicates no known allergies.  Home Medications   Prior to Admission medications   Medication Sig Start Date End Date Taking? Authorizing Provider  Aspirin-Salicylamide-Caffeine (BC FAST PAIN RELIEF) 650-195-33.3 MG PACK Take 1 packet by mouth daily as needed (for pain and headache).   Yes Historical Provider, MD  labetalol (NORMODYNE) 300 MG tablet Take 1 tablet (300 mg total) by mouth every 8 (eight) hours. 04/16/13  Yes Brock Bad, MD  triamterene-hydrochlorothiazide (DYAZIDE) 50-25 MG per capsule Take 1 capsule by mouth every morning. 04/16/13  Yes Brock Bad, MD  HYDROcodone-acetaminophen (NORCO/VICODIN) 5-325 MG per tablet Take 1 tablet by mouth every 6 (six) hours as needed for severe pain. 08/06/13   Shon Baton, MD  ibuprofen (ADVIL,MOTRIN) 600 MG tablet Take 1 tablet (600 mg total) by mouth every 6 (six) hours as needed. 08/06/13   Shon Baton, MD   BP 102/59  Pulse 72  Temp(Src) 98.2 F (36.8 C) (Oral)  Resp 16  SpO2 100%  LMP 07/29/2013 Physical Exam  Nursing note and vitals reviewed. Constitutional: She is oriented to person, place, and time. She appears well-developed and well-nourished.  HENT:  Head: Normocephalic and atraumatic.  Cardiovascular: Normal rate and regular rhythm.   Pulmonary/Chest: Effort normal. No respiratory distress.  Abdominal: Soft. There is no tenderness.  Musculoskeletal: She exhibits no edema.  TTP over the lower lumbar region without stepoff or deformity, no wound or hematoma  Neurological: She is alert and oriented to person, place, and time.  No clonus, equal strength in BLE  Skin: Skin is warm and dry.  Psychiatric: She has a normal mood and affect.    ED Course  Procedures (including critical care  time) Labs Review Labs Reviewed  COMPREHENSIVE METABOLIC PANEL - Abnormal; Notable for the following:    GFR calc non Af Amer 80 (*)    All other components within normal limits  URINALYSIS, ROUTINE W REFLEX MICROSCOPIC  CBC  LIPASE, BLOOD  POC URINE PREG, ED    Imaging Review No results found.   EKG Interpretation None      MDM   Final diagnoses:  Back pain    Patient presents with back pain.  THinks may be related to UTI vs epidural placed 5 mo ago.  Nontoxic and nonfocal.  No red flags of back pain.  Urinalysis clean and other w/u sent from triage unremarkable.  Patient given toradol with some improvement.  Given midline pain, may be related to epidural site; however, low suspicion for spinal pathology or compromise.  Will d/c with norco and ibuprofen.  Encouraged to f/u with OB and or anesthesia if pain persists.  After history, exam, and medical workup I feel the patient has been appropriately medically screened and is safe for discharge home. Pertinent diagnoses were discussed with the patient. Patient was given return precautions.     Shon Batonourtney F Horton, MD 08/09/13 2056

## 2013-08-10 ENCOUNTER — Encounter (HOSPITAL_COMMUNITY): Payer: Self-pay | Admitting: *Deleted

## 2013-08-10 ENCOUNTER — Inpatient Hospital Stay (HOSPITAL_COMMUNITY)
Admission: AD | Admit: 2013-08-10 | Discharge: 2013-08-10 | Disposition: A | Discharge status: Home / Self Care | Payer: Medicaid Other | Source: Ambulatory Visit | Attending: Obstetrics | Admitting: Obstetrics

## 2013-08-10 DIAGNOSIS — G5700 Lesion of sciatic nerve, unspecified lower limb: Secondary | ICD-10-CM

## 2013-08-10 DIAGNOSIS — G5702 Lesion of sciatic nerve, left lower limb: Secondary | ICD-10-CM

## 2013-08-10 DIAGNOSIS — IMO0001 Reserved for inherently not codable concepts without codable children: Secondary | ICD-10-CM | POA: Insufficient documentation

## 2013-08-10 DIAGNOSIS — M79609 Pain in unspecified limb: Secondary | ICD-10-CM | POA: Insufficient documentation

## 2013-08-10 DIAGNOSIS — F17200 Nicotine dependence, unspecified, uncomplicated: Secondary | ICD-10-CM | POA: Insufficient documentation

## 2013-08-10 MED ORDER — KETOROLAC TROMETHAMINE 60 MG/2ML IM SOLN
60.0000 mg | Freq: Once | INTRAMUSCULAR | Status: AC
  Administered 2013-08-10: 60 mg via INTRAMUSCULAR
  Filled 2013-08-10: qty 2

## 2013-08-10 MED ORDER — MELOXICAM 15 MG PO TABS: 15.0000 mg | ORAL_TABLET | Freq: Every day | ORAL | Status: DC

## 2013-08-10 NOTE — MAU Provider Note (Signed)
History     CSN: [id number]  Arrival date and time: 08/10/13 2013   First Provider Initiated Contact with Patient 08/10/13 2049      Chief Complaint  Patient presents with  . Leg Pain   Leg Pain     27 yo [id number] who presents with buttock and left leg pain.    - states was seen 3 days ago for low back pain  - then says the pain moved into her buttock and has since been constant.  - worsened by sitting for long periods of time - pain goes down the back of her leg as well and sometimes even below her knee.  - no sensory changes, no bowel bladder changes, no fevers, chills, nausea, vomiting.   - able to walk well.  - if she stays in persistent motion the pain seems to improve.     Past Medical History  Diagnosis Date  . UTI (lower urinary tract infection)     pt has h/o Acinetobacter in urine culture-requires Contact Isolation with each hodpitalization  . Chlamydia   . Gonorrhea   . Trichomonas   . Abnormal Pap smear   . Eclampsia     Past Surgical History  Procedure Laterality Date  . Wisdom tooth extraction    . Colposcopy      Family History  Problem Relation Age of Onset  . Cancer Maternal Grandmother     History  Substance Use Topics  . Smoking status: Current Every Day Smoker -- 0.25 packs/day    Types: Cigarettes  . Smokeless tobacco: Never Used  . Alcohol Use: Yes     Comment: ocassionally    Allergies: No Known Allergies  Prescriptions prior to admission  Medication Sig Dispense Refill  . Aspirin-Salicylamide-Caffeine (BC FAST PAIN RELIEF) 650-195-33.3 MG PACK Take 1 packet by mouth daily as needed (for pain and headache).      Marland Kitchen HYDROcodone-acetaminophen (NORCO/VICODIN) 5-325 MG per tablet Take 1 tablet by mouth every 6 (six) hours as needed for severe pain.  6 tablet  0  . ibuprofen (ADVIL,MOTRIN) 600 MG tablet Take 1 tablet (600 mg total) by mouth every 6 (six) hours as needed.  30 tablet  0  . labetalol (NORMODYNE) 300 MG tablet Take 1  tablet (300 mg total) by mouth every 8 (eight) hours.  90 tablet  0  . triamterene-hydrochlorothiazide (DYAZIDE) 50-25 MG per capsule Take 1 capsule by mouth every morning.  90 capsule  0    Review of Systems  All other systems reviewed and are negative.  Physical Exam   Blood pressure 110/69, pulse 90, temperature 98.6 F (37 C), resp. rate 20, height 5\' 4"  (1.626 m), weight 81.829 kg (180 lb 6.4 oz), last menstrual period 07/29/2013, not currently breastfeeding.  Physical Exam  Vitals reviewed. Constitutional: She is oriented to person, place, and time. She appears well-developed and well-nourished.  Neck: Neck supple.  Cardiovascular: Normal rate, regular rhythm and normal heart sounds.   Respiratory: Effort normal and breath sounds normal.  GI: Soft. Bowel sounds are normal.  Musculoskeletal:  Tenderness over the left buttock at the level of the piriformis with reproducible pain.    Neg straight leg raise.  Normal ROM of both hip and knee.     Neurological: She is alert and oriented to person, place, and time. She displays normal reflexes.  Skin: Skin is warm and dry.    MAU Course  Procedures  MDM IM toradol   Assessment and Plan  28 yo here for left leg pain and buttock pain most consistent with piriformis syndrome. Not consistent with true sciatica. No red flag symptoms concerning for nerve impingement or vascular compromise.   - discussed diagnosis and reasoning behind why it causes more nerve irritation.  - IM toradol here - d/c home with mobic x 2 week - exercises given - heat as well  F/u with PCP or [facility] if no improvement.    Shemica Meath L Garfield Coiner 08/10/2013, 9:02 PM

## 2013-08-10 NOTE — Discharge Instructions (Signed)
Piriformis Syndrome with Rehab Piriformis syndrome is a condition the affects the nervous system in the area of the hip, and is characterized by pain and possibly a loss of feeling in the backside (posterior) thigh that may extend down the entire length of the leg. The symptoms are caused by an increase in pressure on the sciatic nerve by the piriformis muscle, which is on the back of the hip and is responsible for externally rotating the hip. The sciatic nerve and its branches connect to much of the leg. Normally the sciatic nerve runs between the piriformis muscle and other muscles. However, in certain individuals the nerve runs through the muscle, which causes an increase in pressure on the nerve and results in the symptoms of piriformis syndrome. SYMPTOMS   Pain, tingling, numbness, or burning in the back of the thigh that may also extend down the entire leg.  Occasionally, tenderness in the buttock.  Loss of function of the leg.  Pain that worsens when using the piriformis muscle (running, jumping, or stairs).  Pain that increases with prolonged sitting.  Pain that is lessened by laying flat on the back. CAUSES   Piriformis syndrome is the result of an increase in pressure placed on the sciatic nerve. Often times piriformis syndrome is an overuse injury.  Stress placed on the nerve from a sudden increase in the intensity, frequency, or duration of training.  Compensation of other extremity injuries. RISK INCREASES WITH:  Sports that involve the piriformis muscle (running, walking or jumping).  You are born with (congenital) a defect in which the sciatic nerve passes through the muscle. PREVENTION  Warm up and stretch properly before activity.  Allow for adequate recovery between workouts.  Maintain physical fitness:  Strength, flexibility, and endurance.  Cardiovascular fitness. PROGNOSIS  If treated properly, then the symptoms of piriformis syndrome usually resolve in 2  to 6 weeks. RELATED COMPLICATIONS   Persistent and possibly permanent pain and numbness in the lower extremity.  Weakness of the extremity that may progress to disability and inability to compete. TREATMENT  The most effective treatment for piriformis syndrome is rest from any activities that aggravate the symptoms. Ice and pain medication may help reduce pain and inflammation. The use of strengthening and stretching exercises may help reduce pain with activity. These exercises may be performed at home or with a therapist. A referral to a therapist may be given for further evaluation and treatment, such as ultrasound. Corticosteroid injections may be given to reduce inflammation that is causing pressure to be placed on the sciatic nerve. If non-surgical (conservative) treatment is unsuccessful, then surgery may be recommended.  MEDICATION   If pain medication is necessary, then nonsteroidal anti-inflammatory medications, such as aspirin and ibuprofen, or other minor pain relievers, such as acetaminophen, are often recommended.  Do not take pain medication for 7 days before surgery.  Prescription pain relievers may be given if deemed necessary by your caregiver. Use only as directed and only as much as you need.  Corticosteroid injections may be given by your caregiver. These injections should be reserved for the most serious cases, because they may only be given a certain number of times. HEAT AND COLD:   Cold treatment (icing) relieves pain and reduces inflammation. Cold treatment should be applied for 10 to 15 minutes every 2 to 3 hours for inflammation and pain and immediately after any activity that aggravates your symptoms. Use ice packs or massage the area with a piece of ice (ice massage).    Heat treatment may be used prior to performing the stretching and strengthening activities prescribed by your caregiver, physical therapist, or athletic trainer. Use a heat pack or soak the injury in  warm water. SEEK IMMEDIATE MEDICAL CARE IF:  Treatment seems to offer no benefit, or the condition worsens.  Any medications produce adverse side effects. EXERCISES RANGE OF MOTION (ROM) AND STRETCHING EXERCISES - Piriformis Syndrome These exercises may help you when beginning to rehabilitate your injury. Your symptoms may resolve with or without further involvement from your physician, physical therapist or athletic trainer. While completing these exercises, remember:   Restoring tissue flexibility helps normal motion to return to the joints. This allows healthier, less painful movement and activity.  An effective stretch should be held for at least 30 seconds.  A stretch should never be painful. You should only feel a gentle lengthening or release in the stretched tissue. STRETCH - Hip Rotators  Lie on your back on a firm surface. Grasp your right / left knee with your right / left hand and your ankle with your opposite hand.  Keeping your hips and shoulders firmly planted, gently pull your right / left knee and rotate your lower leg toward your opposite shoulder until you feel a stretch in your buttocks.  Hold this stretch for __________ seconds. Repeat this stretch __________ times. Complete this stretch __________ times per day. STRETCH  Iliotibial Band  On the floor or bed, lie on your side so your right / left leg is on top. Bend your knee and grab your ankle.  Slowly bring your knee back so that your thigh is in line with your trunk. Keep your heel at your buttocks and gently arch your back so your head, shoulders and hips line up.  Slowly lower your leg so that your knee approaches the floor/bed until you feel a gentle stretch on the outside of your right / left thigh. If you do not feel a stretch and your knee will not fall farther, place the heel of your opposite foot on top of your knee and pull your thigh down farther.  Hold this stretch for __________ seconds. Repeat  __________ times. Complete __________ times per day. STRENGTHENING EXERCISES - Piriformis Syndrome  These are some of the caregiver again or until your symptoms are resolved. Remember:   Strong muscles with good endurance tolerate stress better.  Do the exercises as initially prescribed by your caregiver. Progress slowly with each exercise, gradually increasing the number of repetitions and weight used under their guidance. STRENGTH - Hip Abductors, Straight Leg Raises Be aware of your form throughout the entire exercise so that you exercise the correct muscles. Sloppy form means that you are not strengthening the correct muscles.  Lie on your side so that your head, shoulders, knee and hip line up. You may bend your lower knee to help maintain your balance. Your right / left leg should be on top.  Roll your hips slightly forward, so that your hips are stacked directly over each other and your right / left knee is facing forward.  Lift your top leg up 4-6 inches, leading with your heel. Be sure that your foot does not drift forward or that your knee does not roll toward the ceiling.  Hold this position for __________ seconds. You should feel the muscles in your outer hip lifting (you may not notice this until your leg begins to tire).  Slowly lower your leg to the starting position. Allow the muscles to   fully relax before beginning the next repetition. Repeat __________ times. Complete this exercise __________ times per day.  STRENGTH - Hip Abductors, Quadriped  On a firm, lightly padded surface, position yourself on your hands and knees. Your hands should be directly below your shoulders and your knees should be directly below your hips.  Keeping your right / left knee bent, lift your leg out to the side. Keep your legs level and in line with your shoulders.  Position yourself on your hands and knees.  Hold for __________ seconds.  Keeping your trunk steady and your hips level, slowly  lower your leg to the starting position. Repeat __________ times. Complete this exercise __________ times per day.  STRENGTH - Hip Abductors, Standing  Tie one end of a rubber exercise band/tubing to a secure surface (table, pole) and tie a loop at the other end.  Place the loop around your right / left ankle. Keeping your ankle with the band directly opposite of the secured end, step away until there is tension in the tube/band.  Hold onto a chair as needed for balance.  Keeping your back upright, your shoulders over your hips, and your toes pointing forward, lift your right / left leg out to your side. Be sure to lift your leg with your hip muscles. Do not "throw" your leg or tip your body to lift your leg.  Slowly and with control, return to the starting position. Repeat exercise __________ times. Complete this exercise __________ times per day.  Document Released: 02/28/2005 Document Revised: 08/30/2011 Document Reviewed: 06/12/2008 [employer] Patient Information 2014 [employer].  

## 2013-08-10 NOTE — MAU Note (Signed)
Was seen at ED with back pain because thought was uti. Gave me pain med which didn't help a lot. Now having pain L buttocks and down back of L leg. Movement makes it hurt as well as coughing or sneezing.

## 2013-08-10 NOTE — Progress Notes (Signed)
Written and verbal d/c instructions given and understanding voiced. 

## 2013-09-05 ENCOUNTER — Telehealth (HOSPITAL_BASED_OUTPATIENT_CLINIC_OR_DEPARTMENT_OTHER): Payer: Self-pay | Admitting: Emergency Medicine

## 2013-10-02 ENCOUNTER — Ambulatory Visit
Admission: RE | Admit: 2013-10-02 | Discharge: 2013-10-02 | Disposition: A | Discharge status: Home / Self Care | Payer: Medicaid Other | Source: Ambulatory Visit | Attending: Family Medicine | Admitting: Family Medicine

## 2013-10-02 ENCOUNTER — Other Ambulatory Visit: Payer: Self-pay | Admitting: Family Medicine

## 2013-10-02 DIAGNOSIS — R52 Pain, unspecified: Secondary | ICD-10-CM

## 2014-01-13 ENCOUNTER — Encounter (HOSPITAL_COMMUNITY): Payer: Self-pay | Admitting: *Deleted

## 2014-02-02 ENCOUNTER — Encounter (HOSPITAL_COMMUNITY): Payer: Self-pay | Admitting: *Deleted

## 2014-02-02 ENCOUNTER — Inpatient Hospital Stay (HOSPITAL_COMMUNITY)
Admission: AD | Admit: 2014-02-02 | Discharge: 2014-02-03 | Disposition: A | Discharge status: Home / Self Care | Payer: Medicaid Other | Source: Ambulatory Visit | Attending: Obstetrics & Gynecology | Admitting: Obstetrics & Gynecology

## 2014-02-02 DIAGNOSIS — R1010 Upper abdominal pain, unspecified: Secondary | ICD-10-CM | POA: Diagnosis present

## 2014-02-02 LAB — URINALYSIS, ROUTINE W REFLEX MICROSCOPIC
BILIRUBIN URINE: NEGATIVE
Glucose, UA: NEGATIVE mg/dL
Hgb urine dipstick: NEGATIVE
Ketones, ur: NEGATIVE mg/dL
Leukocytes, UA: NEGATIVE
NITRITE: NEGATIVE
Protein, ur: NEGATIVE mg/dL
Urobilinogen, UA: 0.2 mg/dL (ref 0.0–1.0)
pH: 6 (ref 5.0–8.0)

## 2014-02-02 LAB — POCT PREGNANCY, URINE: PREG TEST UR: NEGATIVE

## 2014-02-02 NOTE — MAU Note (Signed)
Pt reports mid abd pain since this am, denies nausea, vomiting, diarrhea, or fever.

## 2014-02-03 ENCOUNTER — Inpatient Hospital Stay (HOSPITAL_COMMUNITY): Payer: Medicaid Other

## 2014-02-03 DIAGNOSIS — R1010 Upper abdominal pain, unspecified: Secondary | ICD-10-CM

## 2014-02-03 LAB — COMPREHENSIVE METABOLIC PANEL
ALBUMIN: 3.7 g/dL (ref 3.5–5.2)
ALK PHOS: 49 U/L (ref 39–117)
ALT: 8 U/L (ref 0–35)
AST: 11 U/L (ref 0–37)
Anion gap: 9 (ref 5–15)
BILIRUBIN TOTAL: 0.3 mg/dL (ref 0.3–1.2)
BUN: 14 mg/dL (ref 6–23)
CHLORIDE: 103 meq/L (ref 96–112)
CO2: 25 meq/L (ref 19–32)
Calcium: 8.8 mg/dL (ref 8.4–10.5)
Creatinine, Ser: 0.91 mg/dL (ref 0.50–1.10)
GFR calc Af Amer: >90 mL/min (ref >90)
GFR, EST NON AFRICAN AMERICAN: 85 mL/min — AB (ref >90)
Glucose, Bld: 96 mg/dL (ref 70–99)
POTASSIUM: 3.9 meq/L (ref 3.7–5.3)
Sodium: 137 mEq/L (ref 137–147)
Total Protein: 6.4 g/dL (ref 6.0–8.3)

## 2014-02-03 LAB — CBC
HCT: 37.9 % (ref 36.0–46.0)
Hemoglobin: 12.9 g/dL (ref 12.0–15.0)
MCH: 33.1 pg (ref 26.0–34.0)
MCHC: 34 g/dL (ref 30.0–36.0)
MCV: 97.2 fL (ref 78.0–100.0)
Platelets: 172 10*3/uL (ref 150–400)
RBC: 3.9 MIL/uL (ref 3.87–5.11)
RDW: 13.8 % (ref 11.5–15.5)
WBC: 4.6 10*3/uL (ref 4.0–10.5)

## 2014-02-03 LAB — AMYLASE: Amylase: 55 U/L (ref 0–105)

## 2014-02-03 LAB — LIPASE, BLOOD: Lipase: 27 U/L (ref 11–59)

## 2014-02-03 MED ORDER — HYDROMORPHONE HCL 2 MG/ML IJ SOLN
2.0000 mg | Freq: Once | INTRAMUSCULAR | Status: AC
  Administered 2014-02-03: 2 mg via INTRAMUSCULAR
  Filled 2014-02-03: qty 1

## 2014-02-03 MED ORDER — TRAMADOL HCL 50 MG PO TABS
50.0000 mg | ORAL_TABLET | Freq: Four times a day (QID) | ORAL | Status: DC | PRN
Start: 2014-02-03 — End: 2015-03-25

## 2014-02-03 NOTE — MAU Provider Note (Signed)
Chief Complaint: Abdominal Pain   First Provider Initiated Contact with Patient 02/03/14 0059     SUBJECTIVE HPI: Alicia Castro is a 28 y.o. [id number] female who presents with severe pain across upper abdomen since this morning. No Hx similar pain. No associated Sx. Had not tried anything for pain. Pain is so severe that she feels as if she can't eat.   No Hx GI problems.   Past Medical History  Diagnosis Date  . UTI (lower urinary tract infection)     pt has h/o Acinetobacter in urine culture-requires Contact Isolation with each hodpitalization  . Chlamydia   . Gonorrhea   . Trichomonas   . Abnormal Pap smear   . Eclampsia    OB History  Gravida Para Term Preterm AB SAB TAB Ectopic Multiple Living  5 3 2 1 2  2   2     # Outcome Date GA Lbr Len/2nd Weight Sex Delivery Anes PTL Lv  5 Preterm 03/31/13 3465w2d 22:46 / 00:35 2.45 kg (5 lb 6.4 oz) F Vag-Spont EPI  Y  4 Term 2008        FD  3 Term 2005        Y  2 TAB           1 TAB              Past Surgical History  Procedure Laterality Date  . Wisdom tooth extraction    . Colposcopy     History   Social History  . Marital Status: Single    Spouse Name: N/A    Number of Children: N/A  . Years of Education: N/A   Occupational History  . Not on file.   Social History Main Topics  . Smoking status: Current Every Day Smoker -- 0.25 packs/day    Types: Cigarettes  . Smokeless tobacco: Never Used  . Alcohol Use: Yes     Comment: ocassionally  . Drug Use: 7.00 per week    Special: Marijuana     Comment: ocassionally   . Sexual Activity: Yes    Birth Control/ Protection: Condom     Comment: Sometimes   Other Topics Concern  . Not on file   Social History Narrative   No current facility-administered medications on file prior to encounter.   Current Outpatient Prescriptions on File Prior to Encounter  Medication Sig Dispense Refill  . Aspirin-Salicylamide-Caffeine (BC FAST PAIN RELIEF) 650-195-33.3 MG PACK  Take 1 packet by mouth daily as needed (for pain and headache).    Marland Kitchen. HYDROcodone-acetaminophen (NORCO/VICODIN) 5-325 MG per tablet Take 1 tablet by mouth every 6 (six) hours as needed for severe pain. 6 tablet 0  . labetalol (NORMODYNE) 300 MG tablet Take 1 tablet (300 mg total) by mouth every 8 (eight) hours. 90 tablet 0  . meloxicam (MOBIC) 15 MG tablet Take 1 tablet (15 mg total) by mouth daily. 20 tablet 0  . triamterene-hydrochlorothiazide (DYAZIDE) 50-25 MG per capsule Take 1 capsule by mouth every morning. 90 capsule 0   No Known Allergies  ROS: Pertinent positive items in HPI. Neg for fever, chills, N/V/D/C, bloody stools, urinary complaints, injury. Last BM 3pm today, normal.   OBJECTIVE Blood pressure 102/75, pulse 75, temperature 98 F (36.7 C), temperature source Oral, resp. rate 16, height 5\' 5"  (1.651 m), weight 85.73 kg (189 lb), last menstrual period 01/07/2014, SpO2 100 %, not currently breastfeeding. GENERAL: Well-developed, well-nourished female in mod distress. Worse when lying on her back.  Initially refused exam due to pain of rolling on her back.  HEENT: Normocephalic HEART: normal rate RESP: normal effort ABDOMEN: Soft, moderate upper abd tenderness. No specific murphy's sign. Pos BS x 4. No CVAT. Neg mass. Voluntary guarding.  EXTREMITIES: Nontender, no edema NEURO: Alert and oriented SPECULUM EXAM: Deferred  LAB RESULTS Results for orders placed or performed during the hospital encounter of 02/02/14 (from the past 24 hour(s))  Urinalysis, Routine w reflex microscopic     Status: Abnormal   Collection Time: 02/02/14 10:39 PM  Result Value Ref Range   Color, Urine YELLOW YELLOW   APPearance CLEAR CLEAR   Specific Gravity, Urine >1.030 (H) 1.005 - 1.030   pH 6.0 5.0 - 8.0   Glucose, UA NEGATIVE NEGATIVE mg/dL   Hgb urine dipstick NEGATIVE NEGATIVE   Bilirubin Urine NEGATIVE NEGATIVE   Ketones, ur NEGATIVE NEGATIVE mg/dL   Protein, ur NEGATIVE NEGATIVE mg/dL    Urobilinogen, UA 0.2 0.0 - 1.0 mg/dL   Nitrite NEGATIVE NEGATIVE   Leukocytes, UA NEGATIVE NEGATIVE  Pregnancy, urine POC     Status: None   Collection Time: 02/02/14 10:49 PM  Result Value Ref Range   Preg Test, Ur NEGATIVE NEGATIVE  CBC     Status: None   Collection Time: 02/03/14 12:12 AM  Result Value Ref Range   WBC 4.6 4.0 - 10.5 K/uL   RBC 3.90 3.87 - 5.11 MIL/uL   Hemoglobin 12.9 12.0 - 15.0 g/dL   HCT 16.1 09.6 - 04.5 %   MCV 97.2 78.0 - 100.0 fL   MCH 33.1 26.0 - 34.0 pg   MCHC 34.0 30.0 - 36.0 g/dL   RDW 40.9 81.1 - 91.4 %   Platelets 172 150 - 400 K/uL  Comprehensive metabolic panel     Status: Abnormal   Collection Time: 02/03/14 12:12 AM  Result Value Ref Range   Sodium 137 137 - 147 mEq/L   Potassium 3.9 3.7 - 5.3 mEq/L   Chloride 103 96 - 112 mEq/L   CO2 25 19 - 32 mEq/L   Glucose, Bld 96 70 - 99 mg/dL   BUN 14 6 - 23 mg/dL   Creatinine, Ser 7.82 0.50 - 1.10 mg/dL   Calcium 8.8 8.4 - 95.6 mg/dL   Total Protein 6.4 6.0 - 8.3 g/dL   Albumin 3.7 3.5 - 5.2 g/dL   AST 11 0 - 37 U/L   ALT 8 0 - 35 U/L   Alkaline Phosphatase 49 39 - 117 U/L   Total Bilirubin 0.3 0.3 - 1.2 mg/dL   GFR calc non Af Amer 85 (L) >90 mL/min   GFR calc Af Amer >90 >90 mL/min   Anion gap 9 5 - 15  Amylase     Status: None   Collection Time: 02/03/14 12:12 AM  Result Value Ref Range   Amylase 55 0 - 105 U/L  Lipase, blood     Status: None   Collection Time: 02/03/14 12:12 AM  Result Value Ref Range   Lipase 27 11 - 59 U/L    IMAGING US Abdomen Complete  02/03/2014   CLINICAL DATA:  Upper abdominal pain.  EXAM: ULTRASOUND ABDOMEN COMPLETE  COMPARISON:  None.  FINDINGS: Gallbladder: No gallstones or wall thickening visualized. No sonographic Murphy sign noted.  Common bile duct: Diameter: Up to 5 mm  Liver: There is a echogenic lesion within the central left liver measuring 8 mm. No morphologic changes of cirrhosis. No additional focal abnormality. Antegrade flow in  the imaged  hepatic and portal venous system. No subcapsular fluid collection.  IVC: No abnormality visualized.  Pancreas: Visualized portion unremarkable.  Spleen: Size and appearance within normal limits.  Right Kidney: Length: 11 cm. Echogenicity within normal limits. No mass or hydronephrosis visualized.  Left Kidney: Length: 11 cm. Echogenicity within normal limits. No mass or hydronephrosis visualized.  Abdominal aorta: No aneurysm visualized.  Other findings: None.  IMPRESSION: 1. No acute findings to explain pain. 2. 8 mm lesion in the left liver, appearance most consistent with hemangioma.   Electronically Signed   By: Tiburcio PeaJonathan  Watts M.D.   On: 02/03/2014 02:44    MAU COURSE Pain resolved with Dilaudid IM. Patient resting comfortably.  Discussed normal results of blood work and ultrasound with patient and partner. No clear etiology for pain identified. [facility] evaluation versus discharge home. Patient elects discharge home.  ASSESSMENT 1. Upper abdominal pain     PLAN Discharge home in stable condition. Comfort measures. Suggest trying Miralax if symptoms return and constipation or gas are suspected. Suggested over-the-counter GERD medicine GERD is suspected.     Follow-up Information    Follow up with Geraldo PitterBLAND,VEITA J, MD.   Specialty:  Family Medicine   Why:  as needed if symptoms do not resolve   Contact information:   [address] [place] [address] [phone]       Follow up with [facility].   Why:  As needed in emergencies   Contact information:   [address] [place] [phone]        Medication List    STOP taking these medications        HYDROcodone-acetaminophen 5-325 MG per tablet  Commonly known as:  NORCO/VICODIN     labetalol 300 MG tablet  Commonly known as:  NORMODYNE     triamterene-hydrochlorothiazide 50-25 MG per capsule  Commonly known as:  DYAZIDE      TAKE these medications         BC FAST PAIN RELIEF 650-195-33.3 MG Pack  Generic drug:  Aspirin-Salicylamide-Caffeine  Take 1 packet by mouth daily as needed (for pain and headache).     chlorthalidone 25 MG tablet  Commonly known as:  HYGROTON  Take 25 mg by mouth daily.     meloxicam 15 MG tablet  Commonly known as:  MOBIC  Take 1 tablet (15 mg total) by mouth daily.     traMADol 50 MG tablet  Commonly known as:  ULTRAM  Take 1-2 tablets (50-100 mg total) by mouth every 6 (six) hours as needed for severe pain.       [place] Castro, CNM 02/03/2014  3:05 AM

## 2014-02-03 NOTE — Discharge Instructions (Signed)

## 2014-03-10 ENCOUNTER — Encounter: Payer: Self-pay | Admitting: *Deleted

## 2014-03-11 ENCOUNTER — Encounter: Payer: Self-pay | Admitting: Obstetrics & Gynecology

## 2014-03-18 IMAGING — US US OB LIMITED
1 series · 13 of 19 positions shown · non-contrast
Comparison: none

[Series 1: us ob limited · 0.23mm/px · 13 of 19 slices shown]
[im 1/19]
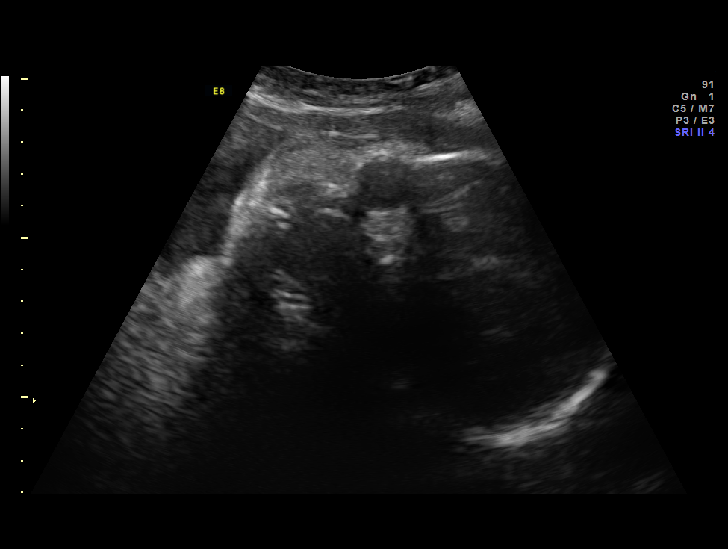
[im 3/19]
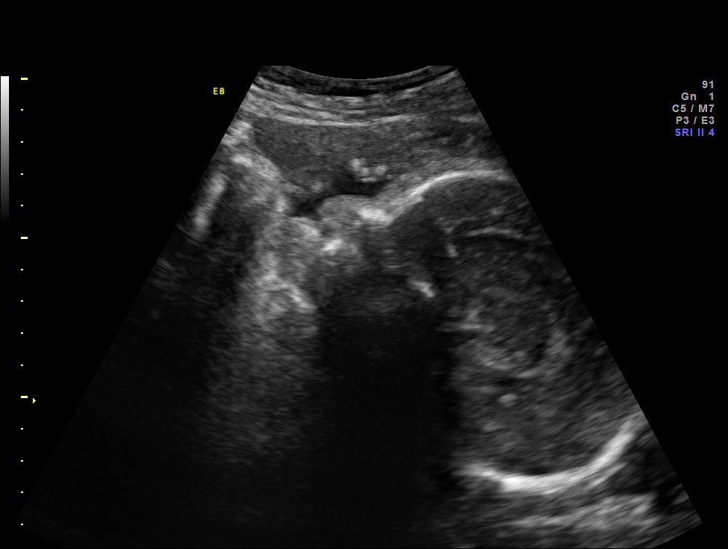
[im 4/19]
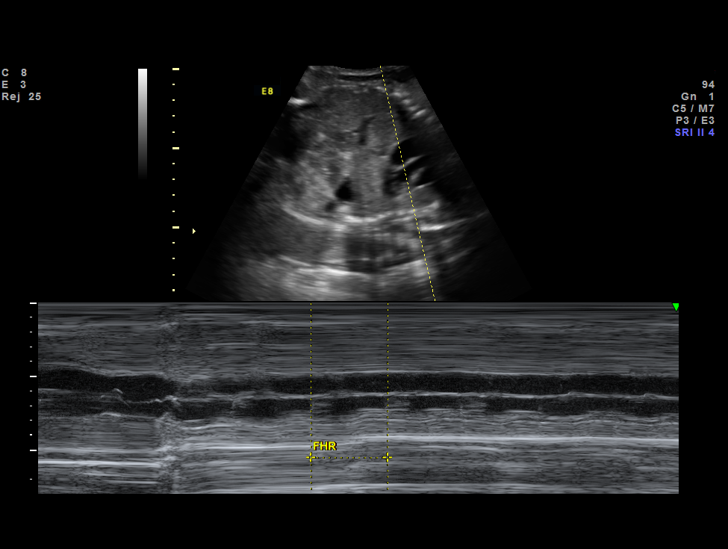
[im 6/19]
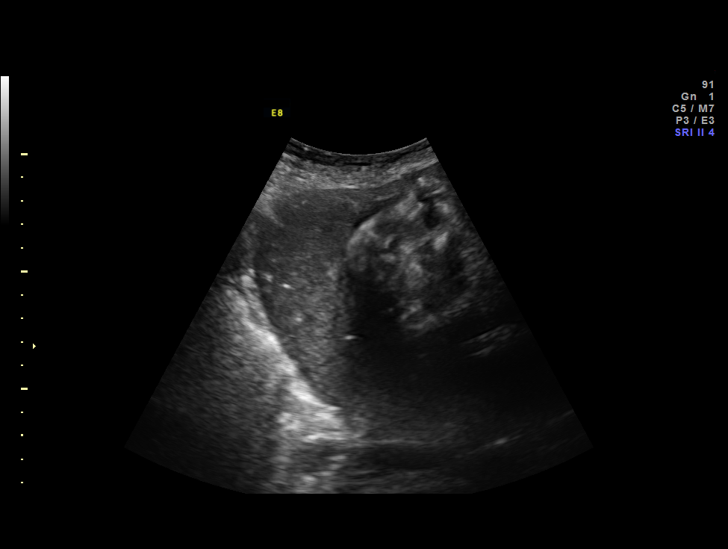
[im 7/19]
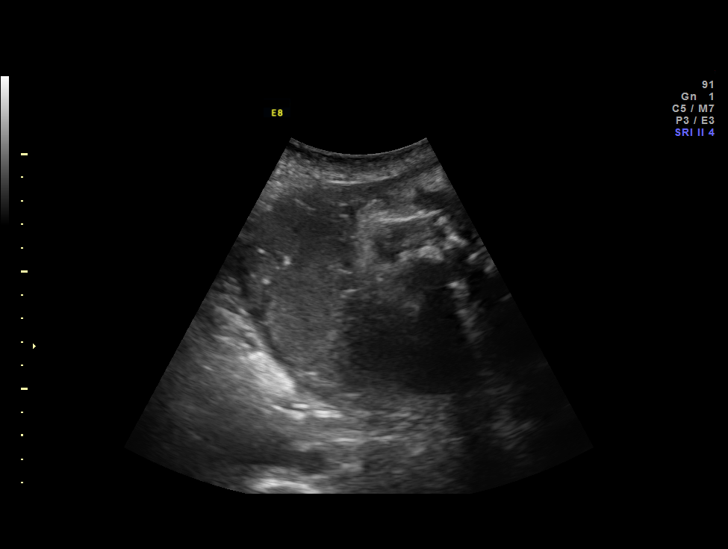
[im 9/19]
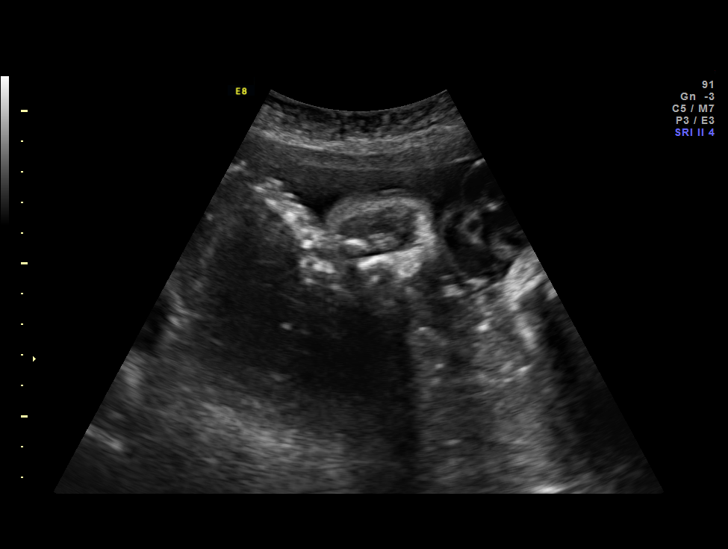
[im 10/19]
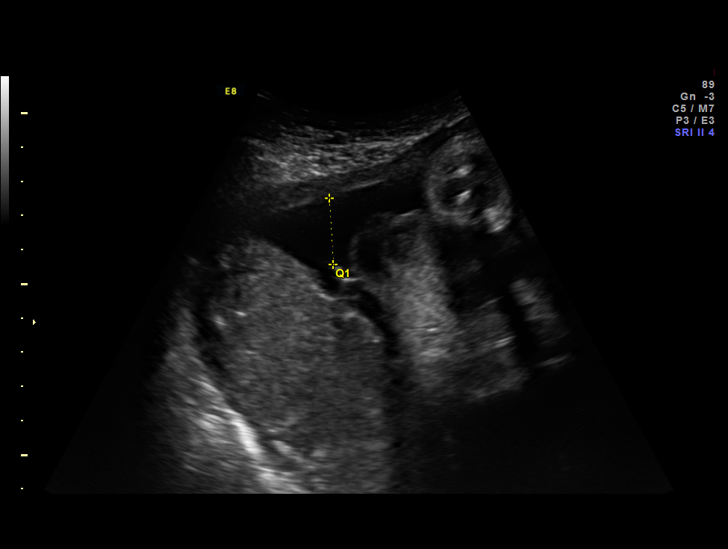
[im 11/19]
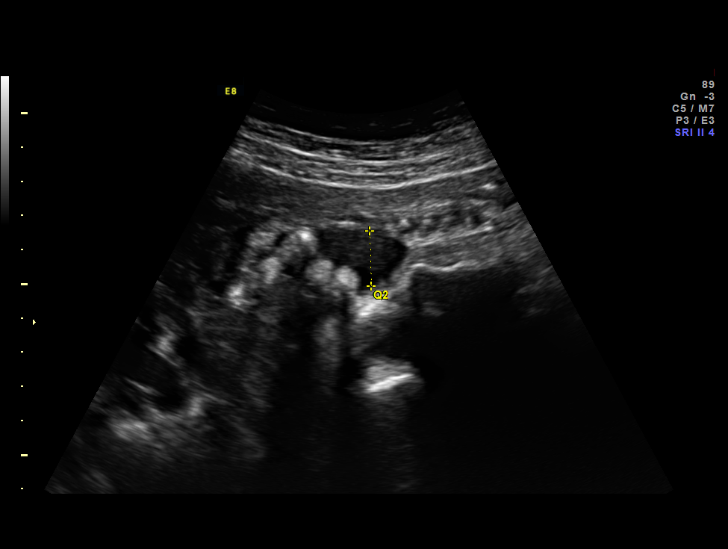
[im 13/19]
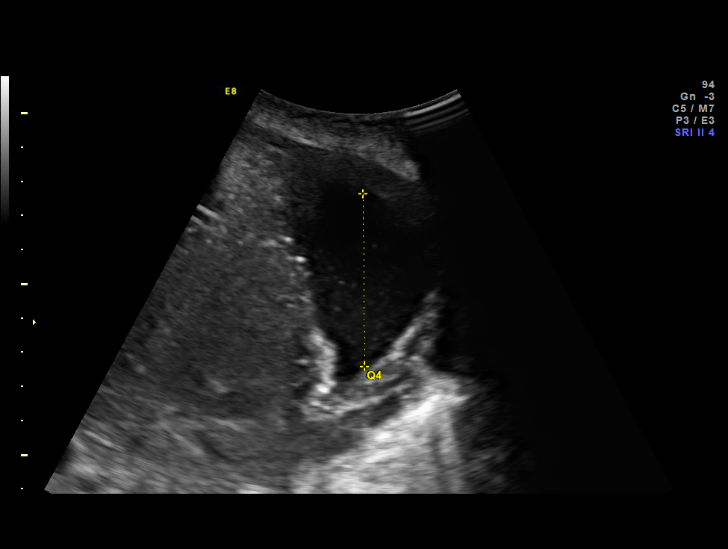
[im 14/19]
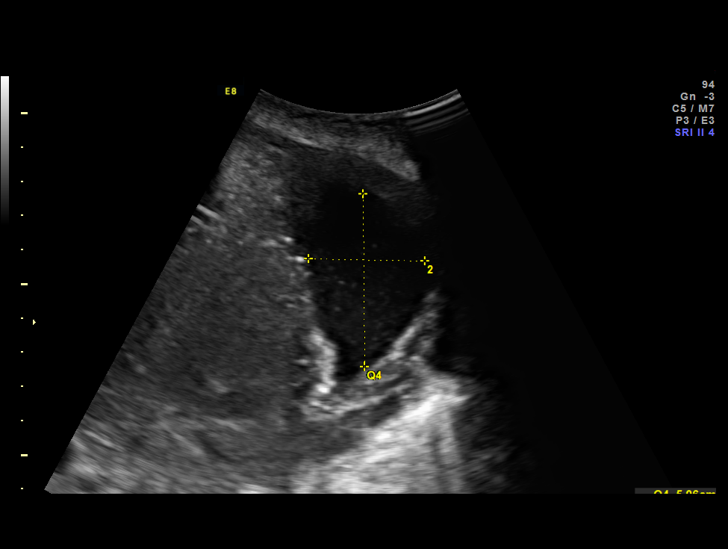
[im 16/19]
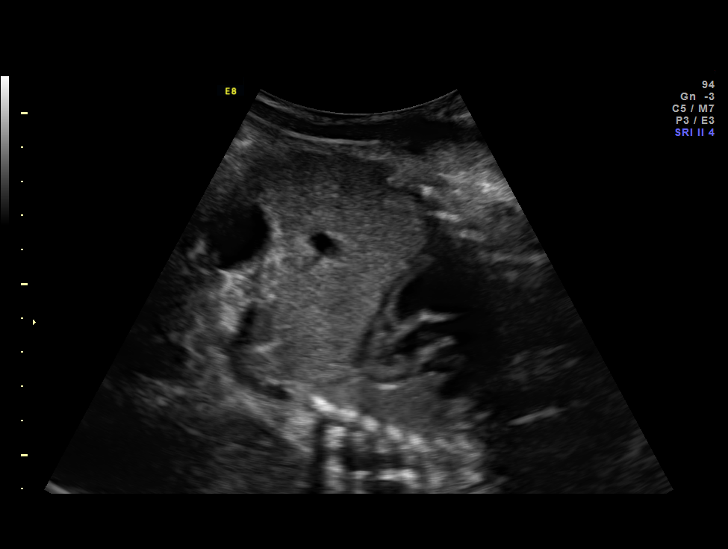
[im 17/19]
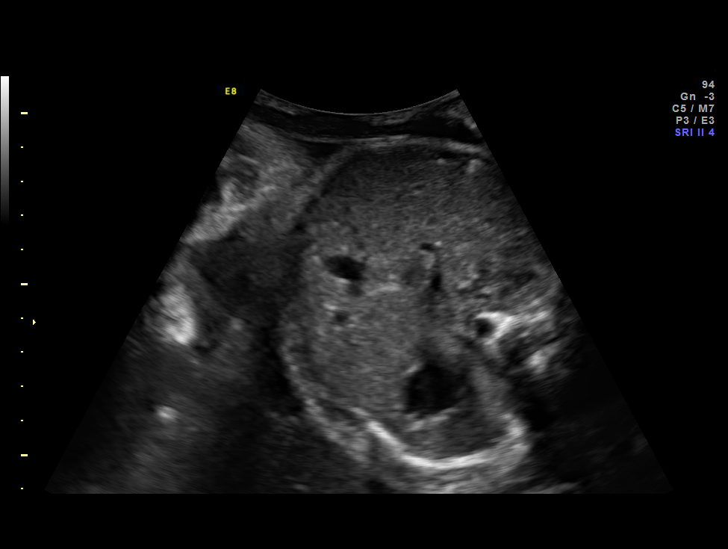
[im 19/19]
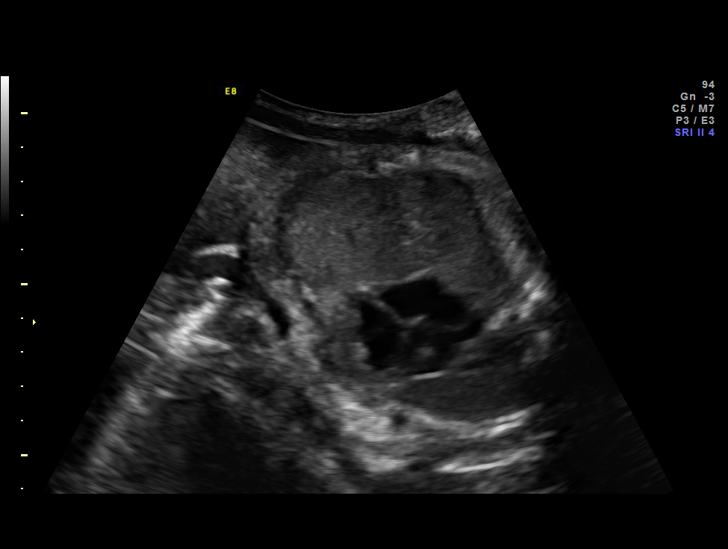

[13 of 19 positions shown; findings below may reference images not displayed]

OBSTETRICS REPORT
                      (Signed Final 03/28/2013 [DATE])

             SARELLANO

Service(s) Provided

 [HOSPITAL]                                         [address].0
Indications

 Medical complication of pregnancy (pyelonephritis)
 Poor obstetric history: Previous IUFD (stillbirth at
 31 weeks)
 Cigarette smoker
Fetal Evaluation

 Num Of Fetuses:    1
 Fetal Heart Rate:  131                          bpm
 Cardiac Activity:  Observed
 Presentation:      Cephalic
 Placenta:          Posterior, above cervical
                    os
 P. Cord            Previously Visualized
 Insertion:

 Amniotic Fluid
 AFI FV:      Subjectively within normal limits
 AFI Sum:     9.56    cm       19  %Tile     Larg Pckt:    5.06  cm
 RUQ:   1.94    cm   RLQ:    5.06   cm    LUQ:   1.62    cm   LLQ:    0.94   cm
Gestational Age

 LMP:           35w 6d        Date:  07/20/12                 EDD:   04/26/13
 Best:          35w 6d     Det. By:  LMP  (07/20/12)          EDD:   04/26/13
Cervix Uterus Adnexa

 Cervix:       Not visualized (advanced GA >50wks)
Impression

 Single living intrauterine pregnancy at 35 weeks 6 days.
 Normal amniotic fluid volume.
 AFI 9.6 cm
 Reassuring modified BPP.
Recommendations

 Continue 2x weekly NSTs with weekly AFIs
 If testing remains reassuring, recommend delivery at 39
 weeks due to prior history of 31 week IUFD.

 SARELLANO with us.  Please do not hesitate to contact

## 2014-05-22 ENCOUNTER — Encounter (HOSPITAL_COMMUNITY): Payer: Self-pay | Admitting: Physical Medicine and Rehabilitation

## 2014-05-22 ENCOUNTER — Emergency Department (HOSPITAL_COMMUNITY)
Admission: EM | Admit: 2014-05-22 | Discharge: 2014-05-22 | Disposition: A | Discharge status: Home / Self Care | Payer: Medicaid Other | Attending: Emergency Medicine | Admitting: Emergency Medicine

## 2014-05-22 ENCOUNTER — Emergency Department (HOSPITAL_COMMUNITY)
Admission: EM | Admit: 2014-05-22 | Discharge: 2014-05-22 | Disposition: A | Discharge status: Home / Self Care | Payer: Medicaid Other | Source: Home / Self Care | Attending: Emergency Medicine | Admitting: Emergency Medicine

## 2014-05-22 ENCOUNTER — Encounter (HOSPITAL_COMMUNITY): Payer: Self-pay | Admitting: *Deleted

## 2014-05-22 ENCOUNTER — Emergency Department (HOSPITAL_COMMUNITY): Payer: Medicaid Other

## 2014-05-22 DIAGNOSIS — R109 Unspecified abdominal pain: Secondary | ICD-10-CM | POA: Diagnosis present

## 2014-05-22 DIAGNOSIS — Z3202 Encounter for pregnancy test, result negative: Secondary | ICD-10-CM | POA: Insufficient documentation

## 2014-05-22 DIAGNOSIS — N12 Tubulo-interstitial nephritis, not specified as acute or chronic: Secondary | ICD-10-CM

## 2014-05-22 DIAGNOSIS — A5901 Trichomonal vulvovaginitis: Secondary | ICD-10-CM | POA: Diagnosis not present

## 2014-05-22 DIAGNOSIS — Z720 Tobacco use: Secondary | ICD-10-CM | POA: Insufficient documentation

## 2014-05-22 DIAGNOSIS — Z79899 Other long term (current) drug therapy: Secondary | ICD-10-CM | POA: Diagnosis not present

## 2014-05-22 DIAGNOSIS — Z87448 Personal history of other diseases of urinary system: Secondary | ICD-10-CM | POA: Insufficient documentation

## 2014-05-22 DIAGNOSIS — M549 Dorsalgia, unspecified: Secondary | ICD-10-CM | POA: Insufficient documentation

## 2014-05-22 DIAGNOSIS — Z791 Long term (current) use of non-steroidal anti-inflammatories (NSAID): Secondary | ICD-10-CM | POA: Diagnosis not present

## 2014-05-22 DIAGNOSIS — A599 Trichomoniasis, unspecified: Secondary | ICD-10-CM

## 2014-05-22 DIAGNOSIS — R1084 Generalized abdominal pain: Secondary | ICD-10-CM | POA: Insufficient documentation

## 2014-05-22 LAB — COMPREHENSIVE METABOLIC PANEL
ALT: 39 U/L — AB (ref 0–35)
ANION GAP: 4 — AB (ref 5–15)
AST: 27 U/L (ref 0–37)
Albumin: 3.8 g/dL (ref 3.5–5.2)
Alkaline Phosphatase: 58 U/L (ref 39–117)
BILIRUBIN TOTAL: 0.6 mg/dL (ref 0.3–1.2)
BUN: 14 mg/dL (ref 6–23)
CHLORIDE: 107 mmol/L (ref 96–112)
CO2: 27 mmol/L (ref 19–32)
CREATININE: 1.03 mg/dL (ref 0.50–1.10)
Calcium: 9.1 mg/dL (ref 8.4–10.5)
GFR calc non Af Amer: 73 mL/min — ABNORMAL LOW (ref >90)
GFR, EST AFRICAN AMERICAN: 84 mL/min — AB (ref >90)
Glucose, Bld: 87 mg/dL (ref 70–99)
Potassium: 3.9 mmol/L (ref 3.5–5.1)
SODIUM: 138 mmol/L (ref 135–145)
TOTAL PROTEIN: 7.1 g/dL (ref 6.0–8.3)

## 2014-05-22 LAB — URINALYSIS, ROUTINE W REFLEX MICROSCOPIC
Bilirubin Urine: NEGATIVE
Glucose, UA: NEGATIVE mg/dL
Ketones, ur: NEGATIVE mg/dL
NITRITE: POSITIVE — AB
PH: 6 (ref 5.0–8.0)
PROTEIN: 30 mg/dL — AB
Specific Gravity, Urine: 1.018 (ref 1.005–1.030)
UROBILINOGEN UA: 0.2 mg/dL (ref 0.0–1.0)

## 2014-05-22 LAB — CBC WITH DIFFERENTIAL/PLATELET
BASOS ABS: 0 10*3/uL (ref 0.0–0.1)
Basophils Relative: 0 % (ref 0–1)
EOS PCT: 2 % (ref 0–5)
Eosinophils Absolute: 0.1 10*3/uL (ref 0.0–0.7)
HEMATOCRIT: 40.2 % (ref 36.0–46.0)
Hemoglobin: 13.1 g/dL (ref 12.0–15.0)
LYMPHS PCT: 31 % (ref 12–46)
Lymphs Abs: 1.7 10*3/uL (ref 0.7–4.0)
MCH: 31 pg (ref 26.0–34.0)
MCHC: 32.6 g/dL (ref 30.0–36.0)
MCV: 95.3 fL (ref 78.0–100.0)
MONOS PCT: 14 % — AB (ref 3–12)
Monocytes Absolute: 0.8 10*3/uL (ref 0.1–1.0)
NEUTROS ABS: 3 10*3/uL (ref 1.7–7.7)
Neutrophils Relative %: 53 % (ref 43–77)
Platelets: 214 10*3/uL (ref 150–400)
RBC: 4.22 MIL/uL (ref 3.87–5.11)
RDW: 14.6 % (ref 11.5–15.5)
WBC: 5.6 10*3/uL (ref 4.0–10.5)

## 2014-05-22 LAB — LIPASE, BLOOD: Lipase: 26 U/L (ref 11–59)

## 2014-05-22 LAB — URINE MICROSCOPIC-ADD ON

## 2014-05-22 LAB — POC URINE PREG, ED: Preg Test, Ur: NEGATIVE

## 2014-05-22 LAB — WET PREP, GENITAL: YEAST WET PREP: NONE SEEN

## 2014-05-22 MED ORDER — METRONIDAZOLE 500 MG PO TABS
2000.0000 mg | ORAL_TABLET | Freq: Once | ORAL | Status: AC
  Administered 2014-05-22: 2000 mg via ORAL
  Filled 2014-05-22: qty 4

## 2014-05-22 MED ORDER — PHENAZOPYRIDINE HCL 200 MG PO TABS: 200.0000 mg | ORAL_TABLET | Freq: Three times a day (TID) | ORAL | Status: DC

## 2014-05-22 MED ORDER — HYDROMORPHONE HCL 1 MG/ML IJ SOLN
1.0000 mg | Freq: Once | INTRAMUSCULAR | Status: AC
  Administered 2014-05-22: 1 mg via INTRAVENOUS
  Filled 2014-05-22: qty 1

## 2014-05-22 MED ORDER — ONDANSETRON HCL 4 MG/2ML IJ SOLN
4.0000 mg | INTRAMUSCULAR | Status: AC
  Administered 2014-05-22: 4 mg via INTRAVENOUS
  Filled 2014-05-22: qty 2

## 2014-05-22 MED ORDER — CEPHALEXIN 500 MG PO CAPS: 500.0000 mg | ORAL_CAPSULE | Freq: Four times a day (QID) | ORAL | Status: DC

## 2014-05-22 MED ORDER — DEXTROSE 5 % IV SOLN
1.0000 g | Freq: Once | INTRAVENOUS | Status: AC
  Administered 2014-05-22: 1 g via INTRAVENOUS
  Filled 2014-05-22: qty 10

## 2014-05-22 MED ORDER — SODIUM CHLORIDE 0.9 % IV BOLUS (SEPSIS)
1000.0000 mL | INTRAVENOUS | Status: AC
Start: 2014-05-22 — End: 2014-05-22
  Administered 2014-05-22: 1000 mL via INTRAVENOUS

## 2014-05-22 NOTE — ED Notes (Signed)
Pt here for diffuse abdominal pain and back pain since Mon and dysuria since Tues.  Denies vaginal discharge, nvd or fevers.

## 2014-05-22 NOTE — ED Notes (Signed)
Pt called with no answer

## 2014-05-22 NOTE — ED Provider Notes (Signed)
CSN: [id number]     Arrival date & time 05/22/14  1140 History   First MD Initiated Contact with Patient 05/22/14 1232     Chief Complaint  Patient presents with  . Abdominal Pain   (Consider location/radiation/quality/duration/timing/severity/associated sxs/prior Treatment) HPI  Alicia Castro is a 29 year old female presenting with report of abdominal pain. She states his pain began 3 days ago, she first noticed that her abdomen felt bloated. She states she also been having urinary frequency and urgency and has noticed some vaginal discharge and odor.  She also reports back pain, worse on the left flank. She describes the pain as a constant fullness and rates as 7/10.  Her LMP was 3 weeks ago. She reports feeling nauseated but denies any vomiting or diarrhea.   Past Medical History  Diagnosis Date  . UTI (lower urinary tract infection)     pt has h/o Acinetobacter in urine culture-requires Contact Isolation with each hodpitalization  . Chlamydia   . Gonorrhea   . Trichomonas   . Abnormal Pap smear   . Eclampsia    Past Surgical History  Procedure Laterality Date  . Wisdom tooth extraction    . Colposcopy     Family History  Problem Relation Age of Onset  . Cancer Maternal Grandmother    History  Substance Use Topics  . Smoking status: Current Every Day Smoker -- 0.25 packs/day    Types: Cigarettes  . Smokeless tobacco: Never Used  . Alcohol Use: Yes     Comment: occ   OB History    Gravida Para Term Preterm AB TAB SAB Ectopic Multiple Living   Review of Systems  Constitutional: Negative for fever and chills.  HENT: Negative for sore throat.   Eyes: Negative for visual disturbance.  Respiratory: Negative for cough and shortness of breath.   Cardiovascular: Negative for chest pain and leg swelling.  Gastrointestinal: Negative for nausea, vomiting and diarrhea.  Genitourinary: Positive for dysuria, frequency, flank pain, vaginal discharge  and vaginal pain. Negative for pelvic pain and dyspareunia.  Musculoskeletal: Negative for myalgias.  Skin: Negative for rash.  Neurological: Negative for weakness, numbness and headaches.      Allergies  Review of patient's allergies indicates no known allergies.  Home Medications   Prior to Admission medications   Medication Sig Start Date End Date Taking? Authorizing Provider  Aspirin-Salicylamide-Caffeine (BC FAST PAIN RELIEF) 650-195-33.3 MG PACK Take 1 packet by mouth daily as needed (for pain and headache).    Historical Provider, MD  chlorthalidone (HYGROTON) 25 MG tablet Take 25 mg by mouth daily.    Historical Provider, MD  meloxicam (MOBIC) 15 MG tablet Take 1 tablet (15 mg total) by mouth daily. 08/10/13   Vale Haven, MD  traMADol (ULTRAM) 50 MG tablet Take 1-2 tablets (50-100 mg total) by mouth every 6 (six) hours as needed for severe pain. 02/03/14   Koleen Nimrod Smith, CNM   BP 102/65 mmHg  Pulse 81  Temp(Src) 98.1 F (36.7 C) (Oral)  Resp 14  SpO2 100%  LMP 04/23/2014 Physical Exam  Constitutional: She appears well-developed and well-nourished. No distress.  HENT:  Head: Normocephalic and atraumatic.  Mouth/Throat: Oropharynx is clear and moist. No oropharyngeal exudate.  Eyes: Conjunctivae are normal.  Neck: Neck supple. No thyromegaly present.  Cardiovascular: Normal rate, regular rhythm and intact distal pulses.   Pulmonary/Chest: Effort normal and breath sounds normal. No respiratory  distress. She has no wheezes. She has no rales. She exhibits no tenderness.  Abdominal: Soft. She exhibits no distension and no mass. There is generalized tenderness. There is CVA tenderness. There is no rigidity, no rebound, no guarding, no tenderness at McBurney's point and negative Murphy's sign.    Generalized abd TTP, bilat CVA tenderness and left flank pain  Genitourinary: There is no tenderness on the right labia. There is no tenderness on the left labia. Uterus is not  enlarged and not tender. Cervix exhibits discharge. Cervix exhibits no motion tenderness and no friability. Right adnexum displays no tenderness. Left adnexum displays no tenderness. No vaginal discharge found.  Pale green discharge in vaginal vault, no adnexal or cervical motion tenderness  Musculoskeletal: She exhibits no tenderness.  Lymphadenopathy:    She has no cervical adenopathy.  Neurological: She is alert.  Skin: Skin is warm and dry. No rash noted. She is not diaphoretic.  Psychiatric: She has a normal mood and affect.  Nursing note and vitals reviewed.   ED Course  Procedures (including critical care time) Labs Review Labs Reviewed  WET PREP, GENITAL - Abnormal; Notable for the following:    Trich, Wet Prep MODERATE (*)    Clue Cells Wet Prep HPF POC FEW (*)    WBC, Wet Prep HPF POC MANY (*)    All other components within normal limits  URINALYSIS, ROUTINE W REFLEX MICROSCOPIC - Abnormal; Notable for the following:    APPearance CLOUDY (*)    Hgb urine dipstick TRACE (*)    Protein, ur 30 (*)    Nitrite POSITIVE (*)    Leukocytes, UA MODERATE (*)    All other components within normal limits  URINE MICROSCOPIC-ADD ON - Abnormal; Notable for the following:    Squamous Epithelial / LPF FEW (*)    Bacteria, UA MANY (*)    All other components within normal limits  LIPASE, BLOOD  POC URINE PREG, ED  GC/CHLAMYDIA PROBE AMP ([place])    Imaging Review Ct Renal Stone Study  05/22/2014   CLINICAL DATA:  Diffuse abdominal pain and back pain.  EXAM: CT ABDOMEN AND PELVIS WITHOUT CONTRAST  TECHNIQUE: Multidetector CT imaging of the abdomen and pelvis was performed following the standard protocol without IV contrast.  COMPARISON:  None.  FINDINGS: Lower chest: The lung bases are clear. No pleural or pericardial effusion.  Hepatobiliary: No suspicious liver abnormalities identified. The gallbladder appears normal. There is no biliary dilatation.  Pancreas: The pancreas appears  normal.  Spleen: The spleen is unremarkable.  Adrenals/Urinary Tract: Normal appearance of the adrenal glands. The kidneys are both on unremarkable. The urinary bladder appears normal.  Stomach/Bowel: The stomach appears normal. The on opacified small bowel loops are grossly unremarkable. No evidence for bowel dilatation/obstruction. The appendix is visualized and appears normal. Normal appearance of the colon.  Vascular/Lymphatic: Normal appearance of the abdominal aorta. No enlarged retroperitoneal or mesenteric adenopathy. No enlarged pelvic or inguinal lymph nodes.  Reproductive: The uterus and adnexal structures are unremarkable.  Other: A small amount of free fluid is noted within the dependent portion of the pelvis, image 79/series 2.  Musculoskeletal: Review of the visualized bony structures is on unremarkable.  IMPRESSION: 1. Nonobstructing stone is noted within the inferior pole of the left kidney measuring 3 mm. There is no hydronephrosis or ureteral calculi identified. 2. Small amount of free fluid is identified within the pelvis. This is a nonspecific finding in a premenopausal female. 3. The appendix is  visualized and appears normal.   Electronically Signed   By: Signa Kellaylor  Stroud M.D.   On: 05/22/2014 15:21     EKG Interpretation None      MDM   Final diagnoses:  Flank pain  Pyelonephritis  Trichimoniasis   29 yo with urinary symptoms, CVA tenderness, flank pain and vaginal discharge. CBC, CMP, Lipase, UA, Upreg, Pelvic exam, NS bolus and pain meds. Consider UTI, pyelonephritis, vs Renal stone vs STI.    CBC, CMP and Lipase without significant abnormality. UA with hgb, moderate leukocytes and positive nitrites. IV Rocephin given. CT renal stone study negative for obstructing stone. Pelvic exam benign except for light green discharge.  Wet prep shows many WBCs and moderate trich. Discussed all findings with pt.  Treated with Flagyl in the ED.    Pt has been diagnosed with pyelonephritis  and trichomoniasis. She is afebrile, and pain and nausea improved. Pt to be dc home with antibiotics and instructions to follow up with with OBGYN. Discussed importance of using protection when sexually active. Pt understands that they have GC/Chlamydia cultures pending and that they will need to inform all sexual partners if results return positive.  Pt not concerning for PID because hemodynamically stable and no cervical motion tenderness on pelvic exam. Pt is well-appearing, in no acute distress and vital signs reviewed and not concerning. She appears safe to be discharged.  Return precautions provided.  Pt aware of plan and in agreement.     Filed Vitals:   05/22/14 1515 05/22/14 1522 05/22/14 1601 05/22/14 1615  BP: 96/62 102/67 96/64 108/71  Pulse: 67 68 80 86  Temp:      TempSrc:      Resp:  16    SpO2: 100% 100% 100% 96%   Meds given in ED:  Medications  HYDROmorphone (DILAUDID) injection 1 mg (1 mg Intravenous Given 05/22/14 1339)  ondansetron (ZOFRAN) injection 4 mg (4 mg Intravenous Given 05/22/14 1339)  sodium chloride 0.9 % bolus 1,000 mL (0 mLs Intravenous Stopped 05/22/14 1625)  cefTRIAXone (ROCEPHIN) 1 g in dextrose 5 % 50 mL IVPB (0 g Intravenous Stopped 05/22/14 1625)  metroNIDAZOLE (FLAGYL) tablet 2,000 mg (2,000 mg Oral Given 05/22/14 1602)  ondansetron (ZOFRAN) injection 4 mg (4 mg Intravenous Given 05/22/14 1602)    Discharge Medication List as of 05/22/2014  3:58 PM    START taking these medications   Details  cephALEXin (KEFLEX) 500 MG capsule Take 1 capsule (500 mg total) by mouth 4 (four) times daily., Starting 05/22/2014, Until Discontinued, Print    phenazopyridine (PYRIDIUM) 200 MG tablet Take 1 tablet (200 mg total) by mouth 3 (three) times daily., Starting 05/22/2014, Until Discontinued, Print           Harle BattiestElizabeth Faizan Geraci, NP 05/22/14 [id number]  Toy CookeyMegan Docherty, MD 05/23/14 [phone]

## 2014-05-22 NOTE — ED Notes (Signed)
Pt called for room no response.  

## 2014-05-22 NOTE — ED Notes (Signed)
Pt presents to department for evaluation of diffuse abdominal pain and back pain. Ongoing x3 days. No nausea/vomiting/diarrhea. Pt smells of marijuana in triage. She is alert and oriented x4. NAD.

## 2014-05-22 NOTE — Discharge Instructions (Signed)
These follow directions provided. Be sure to follow-up with your primary care doctor to ensure you're getting better. Please take antibiotics until they are all gone. Continue to drink plenty of fluids by mouth to stay well hydrated. Don't hesitate to return for any new, worsening, or concerning symptoms.   SEEK IMMEDIATE MEDICAL CARE IF:  You have a fever or persistent symptoms for more than 2-3 days.  You have a fever and your symptoms suddenly get worse.  You are unable to take your antibiotics or fluids.  You develop shaking chills.  You experience extreme weakness or fainting.  There is no improvement after 2 days of treatment.

## 2014-05-23 LAB — GC/CHLAMYDIA PROBE AMP (~~LOC~~) NOT AT ARMC
Chlamydia: POSITIVE — AB
Neisseria Gonorrhea: NEGATIVE

## 2014-05-26 ENCOUNTER — Telehealth (HOSPITAL_COMMUNITY): Payer: Self-pay

## 2014-05-26 NOTE — ED Notes (Signed)
Positive for chlamydia. Chart sent to edp office for review 

## 2014-06-01 ENCOUNTER — Telehealth (HOSPITAL_BASED_OUTPATIENT_CLINIC_OR_DEPARTMENT_OTHER): Payer: Self-pay | Admitting: Emergency Medicine

## 2014-06-02 ENCOUNTER — Telehealth (HOSPITAL_COMMUNITY): Payer: Self-pay

## 2014-06-02 NOTE — ED Notes (Addendum)
Unable to reach by telephone. Letter sent to address on record. Per Dr Littie DeedsGentry pt needs doxycycling 100 mg bid x 14 days

## 2014-06-10 ENCOUNTER — Telehealth (HOSPITAL_COMMUNITY): Payer: Self-pay

## 2014-06-10 NOTE — ED Notes (Signed)
Unable to contact pt by mail or telephone. Unable to communicate lab results or treatment changes. 

## 2014-07-12 ENCOUNTER — Emergency Department (HOSPITAL_COMMUNITY)
Admission: EM | Admit: 2014-07-12 | Discharge: 2014-07-12 | Discharge status: Against Medical Advice | Payer: No Typology Code available for payment source

## 2014-07-12 ENCOUNTER — Emergency Department (HOSPITAL_COMMUNITY): Payer: Medicaid Other

## 2014-07-12 ENCOUNTER — Emergency Department (HOSPITAL_COMMUNITY)
Admission: EM | Admit: 2014-07-12 | Discharge: 2014-07-12 | Disposition: A | Discharge status: Home / Self Care | Payer: Self-pay | Attending: Emergency Medicine | Admitting: Emergency Medicine

## 2014-07-12 ENCOUNTER — Encounter (HOSPITAL_COMMUNITY): Payer: Self-pay | Admitting: Emergency Medicine

## 2014-07-12 DIAGNOSIS — Z720 Tobacco use: Secondary | ICD-10-CM | POA: Insufficient documentation

## 2014-07-12 DIAGNOSIS — Z8619 Personal history of other infectious and parasitic diseases: Secondary | ICD-10-CM | POA: Insufficient documentation

## 2014-07-12 DIAGNOSIS — N898 Other specified noninflammatory disorders of vagina: Secondary | ICD-10-CM | POA: Insufficient documentation

## 2014-07-12 DIAGNOSIS — Z3202 Encounter for pregnancy test, result negative: Secondary | ICD-10-CM | POA: Insufficient documentation

## 2014-07-12 DIAGNOSIS — R109 Unspecified abdominal pain: Secondary | ICD-10-CM

## 2014-07-12 DIAGNOSIS — Z792 Long term (current) use of antibiotics: Secondary | ICD-10-CM | POA: Insufficient documentation

## 2014-07-12 DIAGNOSIS — Z87440 Personal history of urinary (tract) infections: Secondary | ICD-10-CM | POA: Insufficient documentation

## 2014-07-12 DIAGNOSIS — R102 Pelvic and perineal pain: Secondary | ICD-10-CM

## 2014-07-12 DIAGNOSIS — Z79899 Other long term (current) drug therapy: Secondary | ICD-10-CM | POA: Insufficient documentation

## 2014-07-12 DIAGNOSIS — Z791 Long term (current) use of non-steroidal anti-inflammatories (NSAID): Secondary | ICD-10-CM | POA: Insufficient documentation

## 2014-07-12 DIAGNOSIS — R1030 Lower abdominal pain, unspecified: Secondary | ICD-10-CM | POA: Insufficient documentation

## 2014-07-12 DIAGNOSIS — R110 Nausea: Secondary | ICD-10-CM | POA: Insufficient documentation

## 2014-07-12 DIAGNOSIS — M549 Dorsalgia, unspecified: Secondary | ICD-10-CM

## 2014-07-12 LAB — CBC WITH DIFFERENTIAL/PLATELET
BASOS PCT: 1 % (ref 0–1)
Basophils Absolute: 0 10*3/uL (ref 0.0–0.1)
EOS ABS: 0.1 10*3/uL (ref 0.0–0.7)
Eosinophils Relative: 3 % (ref 0–5)
HCT: 41.2 % (ref 36.0–46.0)
Hemoglobin: 13.1 g/dL (ref 12.0–15.0)
LYMPHS ABS: 2.3 10*3/uL (ref 0.7–4.0)
LYMPHS PCT: 54 % — AB (ref 12–46)
MCH: 31.6 pg (ref 26.0–34.0)
MCHC: 31.8 g/dL (ref 30.0–36.0)
MCV: 99.5 fL (ref 78.0–100.0)
MONO ABS: 0.5 10*3/uL (ref 0.1–1.0)
Monocytes Relative: 11 % (ref 3–12)
NEUTROS ABS: 1.3 10*3/uL — AB (ref 1.7–7.7)
NEUTROS PCT: 31 % — AB (ref 43–77)
PLATELETS: 219 10*3/uL (ref 150–400)
RBC: 4.14 MIL/uL (ref 3.87–5.11)
RDW: 14.2 % (ref 11.5–15.5)
WBC: 4.3 10*3/uL (ref 4.0–10.5)

## 2014-07-12 LAB — COMPREHENSIVE METABOLIC PANEL
ALBUMIN: 4.3 g/dL (ref 3.5–5.2)
ALT: 15 U/L (ref 0–35)
ANION GAP: 7 (ref 5–15)
AST: 17 U/L (ref 0–37)
Alkaline Phosphatase: 58 U/L (ref 39–117)
BUN: 21 mg/dL (ref 6–23)
CHLORIDE: 104 mmol/L (ref 96–112)
CO2: 28 mmol/L (ref 19–32)
Calcium: 8.9 mg/dL (ref 8.4–10.5)
Creatinine, Ser: 0.77 mg/dL (ref 0.50–1.10)
GFR calc Af Amer: >90 mL/min (ref >90)
GFR calc non Af Amer: >90 mL/min (ref >90)
Glucose, Bld: 82 mg/dL (ref 70–99)
Potassium: 3.7 mmol/L (ref 3.5–5.1)
Sodium: 139 mmol/L (ref 135–145)
Total Bilirubin: 0.5 mg/dL (ref 0.3–1.2)
Total Protein: 7.8 g/dL (ref 6.0–8.3)

## 2014-07-12 LAB — WET PREP, GENITAL
TRICH WET PREP: NONE SEEN
YEAST WET PREP: NONE SEEN

## 2014-07-12 LAB — URINALYSIS, ROUTINE W REFLEX MICROSCOPIC
Bilirubin Urine: NEGATIVE
Glucose, UA: NEGATIVE mg/dL
HGB URINE DIPSTICK: NEGATIVE
KETONES UR: NEGATIVE mg/dL
Leukocytes, UA: NEGATIVE
Nitrite: NEGATIVE
PH: 6 (ref 5.0–8.0)
Protein, ur: NEGATIVE mg/dL
SPECIFIC GRAVITY, URINE: 1.036 — AB (ref 1.005–1.030)
Urobilinogen, UA: 1 mg/dL (ref 0.0–1.0)

## 2014-07-12 LAB — LIPASE, BLOOD: Lipase: 34 U/L (ref 11–59)

## 2014-07-12 LAB — POC URINE PREG, ED: Preg Test, Ur: NEGATIVE

## 2014-07-12 MED ORDER — MORPHINE SULFATE 4 MG/ML IJ SOLN
4.0000 mg | Freq: Once | INTRAMUSCULAR | Status: AC
  Administered 2014-07-12: 4 mg via INTRAVENOUS
  Filled 2014-07-12: qty 1

## 2014-07-12 MED ORDER — FENTANYL CITRATE (PF) 100 MCG/2ML IJ SOLN
50.0000 ug | Freq: Once | INTRAMUSCULAR | Status: AC
  Administered 2014-07-12: 50 ug via INTRAVENOUS
  Filled 2014-07-12: qty 2

## 2014-07-12 MED ORDER — SODIUM CHLORIDE 0.9 % IV BOLUS (SEPSIS)
250.0000 mL | Freq: Once | INTRAVENOUS | Status: AC
  Administered 2014-07-12: 250 mL via INTRAVENOUS

## 2014-07-12 MED ORDER — ONDANSETRON HCL 4 MG/2ML IJ SOLN
4.0000 mg | Freq: Once | INTRAMUSCULAR | Status: AC
  Administered 2014-07-12: 4 mg via INTRAVENOUS
  Filled 2014-07-12: qty 2

## 2014-07-12 NOTE — ED Notes (Signed)
Pelvic supplies at bedside. 

## 2014-07-12 NOTE — ED Notes (Signed)
No call when called pt to Triage.

## 2014-07-12 NOTE — ED Provider Notes (Signed)
CSN: [id number]     Arrival date & time 07/12/14  0348 History   None    Chief Complaint  Patient presents with  . Abdominal Pain  . Back Pain     (Consider location/radiation/quality/duration/timing/severity/associated sxs/prior Treatment) Patient is a 29 y.o. female presenting with abdominal pain and back pain. The history is provided by the patient. No language interpreter was used.  Abdominal Pain Back Pain Associated symptoms: abdominal pain   [name] is a 29 y.o female with a history of STD's who presents with new onset, intermittent and sharp abdominal pain that began 2-3 days ago.  She also has some nausea. Her last BM was yesterday. Her LMP was 06/30/14. She states there is a chance she could be pregnant.  She denies any fever, chills, chest pain, shortness of breath, vomiting, diarrhea, constipation, dysuria, hematuria, vaginal bleeding, vaginal discharge, or vaginal odor.  Past Medical History  Diagnosis Date  . UTI (lower urinary tract infection)     pt has h/o Acinetobacter in urine culture-requires Contact Isolation with each hodpitalization  . Chlamydia   . Gonorrhea   . Trichomonas   . Abnormal Pap smear   . Eclampsia    Past Surgical History  Procedure Laterality Date  . Wisdom tooth extraction    . Colposcopy     Family History  Problem Relation Age of Onset  . Cancer Maternal Grandmother    History  Substance Use Topics  . Smoking status: Current Every Day Smoker -- 0.25 packs/day    Types: Cigarettes  . Smokeless tobacco: Never Used  . Alcohol Use: Yes     Comment: occ   OB History    Gravida Para Term Preterm AB TAB SAB Ectopic Multiple Living   Review of Systems  Gastrointestinal: Positive for abdominal pain.  Genitourinary: Negative for flank pain and difficulty urinating.  Musculoskeletal: Positive for back pain.      Allergies  Review of patient's allergies indicates no known allergies.  Home Medications    Prior to Admission medications   Medication Sig Start Date End Date Taking? Authorizing Provider  chlorthalidone (HYGROTON) 25 MG tablet Take 25 mg by mouth daily.   Yes Historical Provider, MD  Aspirin-Salicylamide-Caffeine (BC FAST PAIN RELIEF) 650-195-33.3 MG PACK Take 1 packet by mouth daily as needed (for pain and headache).    Historical Provider, MD  cephALEXin (KEFLEX) 500 MG capsule Take 1 capsule (500 mg total) by mouth 4 (four) times daily. Patient not taking: Reported on 07/12/2014 05/22/14   Harle Battiest, NP  meloxicam (MOBIC) 15 MG tablet Take 1 tablet (15 mg total) by mouth daily. Patient not taking: Reported on 05/22/2014 08/10/13   Vale Haven, MD  phenazopyridine (PYRIDIUM) 200 MG tablet Take 1 tablet (200 mg total) by mouth 3 (three) times daily. Patient not taking: Reported on 07/12/2014 05/22/14   Harle Battiest, NP  traMADol (ULTRAM) 50 MG tablet Take 1-2 tablets (50-100 mg total) by mouth every 6 (six) hours as needed for severe pain. Patient not taking: Reported on 05/22/2014 02/03/14   Dorathy Kinsman, CNM   BP 129/88 mmHg  Pulse 80  Temp(Src) 98.1 F (36.7 C) (Oral)  Resp 18  SpO2 100%  LMP 06/30/2014 Physical Exam  Constitutional: She is oriented to person, place, and time. She appears well-developed and well-nourished.  Sleeping heavily when I entered the room.    HENT:  Head: Normocephalic and  atraumatic.  Eyes: Conjunctivae are normal.  Neck: Normal range of motion. Neck supple.  Cardiovascular: Normal rate, regular rhythm and normal heart sounds.   Pulmonary/Chest: Effort normal and breath sounds normal.  Abdominal: Soft. She exhibits no distension and no mass. There is tenderness in the suprapubic area. There is no CVA tenderness.    Genitourinary:  Chaperone present: Small amount of white vaginal discharge.  No vaginal bleeding.  Os is closed.  Left adnexal tenderness to palpation.   Musculoskeletal: Normal range of motion.  Neurological: She  is alert and oriented to person, place, and time.  Skin: Skin is warm and dry.  Nursing note and vitals reviewed.   ED Course  Procedures (including critical care time) Labs Review Labs Reviewed  WET PREP, GENITAL - Abnormal; Notable for the following:    Clue Cells Wet Prep HPF POC RARE (*)    WBC, Wet Prep HPF POC RARE (*)    All other components within normal limits  CBC WITH DIFFERENTIAL/PLATELET - Abnormal; Notable for the following:    Neutrophils Relative % 31 (*)    Neutro Abs 1.3 (*)    Lymphocytes Relative 54 (*)    All other components within normal limits  URINALYSIS, ROUTINE W REFLEX MICROSCOPIC - Abnormal; Notable for the following:    APPearance CLOUDY (*)    Specific Gravity, Urine 1.036 (*)    All other components within normal limits  COMPREHENSIVE METABOLIC PANEL  LIPASE, BLOOD  POC URINE PREG, ED  GC/CHLAMYDIA PROBE AMP ([place])    Imaging Review [place] Transvaginal Non-ob  07/12/2014   CLINICAL DATA:  Four day history of pelvic pain bilaterally  EXAM: TRANSABDOMINAL AND TRANSVAGINAL ULTRASOUND OF PELVIS  DOPPLER ULTRASOUND OF OVARIES  TECHNIQUE: Study was performed transabdominally to optimize pelvic field of view evaluation and transvaginally to optimize internal visceral architecture evaluation.  Color and duplex Doppler ultrasound was utilized to evaluate blood flow to the ovaries.  COMPARISON:  CT abdomen and pelvis May 22, 2014  FINDINGS: Uterus  Measurements: 8.6 x 3.7 x 6.1 cm. No fibroids or other mass visualized.  Endometrium  Thickness: 11 mm.  No focal abnormality visualized.  Right ovary  Measurements: 4.0 x 2.1 x 2.8 cm. Normal appearance/no adnexal mass.  Left ovary  Measurements: 3.3 x 1.2 x 1.9 cm. Normal appearance/no adnexal mass.  Pulsed Doppler evaluation of both ovaries demonstrates normal low-resistance arterial and venous waveforms. The peak systolic velocity in the left ovary is 11.8 centimeter/second with an end-diastolic velocity of 5.6  centimeter/second. The peak systolic velocity in the right ovary is 7.6 centimeter/second with an end-diastolic velocity of 5.2 centimeter/second.  Other findings  Small amount of free fluid in cul-de-sac.  IMPRESSION: Small amount of free fluid in the cul-de-sac. Question recent ovarian cyst rupture. No intrauterine or extrauterine mass identified. No evidence of ovarian torsion. Endometrium appears within normal limits.   Electronically Signed   By: Bretta BangWilliam  Woodruff III M.D.   On: 07/12/2014 09:57   [place] Pelvis Complete  07/12/2014   CLINICAL DATA:  Four day history of pelvic pain bilaterally  EXAM: TRANSABDOMINAL AND TRANSVAGINAL ULTRASOUND OF PELVIS  DOPPLER ULTRASOUND OF OVARIES  TECHNIQUE: Study was performed transabdominally to optimize pelvic field of view evaluation and transvaginally to optimize internal visceral architecture evaluation.  Color and duplex Doppler ultrasound was utilized to evaluate blood flow to the ovaries.  COMPARISON:  CT abdomen and pelvis May 22, 2014  FINDINGS: Uterus  Measurements: 8.6 x 3.7 x 6.1  cm. No fibroids or other mass visualized.  Endometrium  Thickness: 11 mm.  No focal abnormality visualized.  Right ovary  Measurements: 4.0 x 2.1 x 2.8 cm. Normal appearance/no adnexal mass.  Left ovary  Measurements: 3.3 x 1.2 x 1.9 cm. Normal appearance/no adnexal mass.  Pulsed Doppler evaluation of both ovaries demonstrates normal low-resistance arterial and venous waveforms. The peak systolic velocity in the left ovary is 11.8 centimeter/second with an end-diastolic velocity of 5.6 centimeter/second. The peak systolic velocity in the right ovary is 7.6 centimeter/second with an end-diastolic velocity of 5.2 centimeter/second.  Other findings  Small amount of free fluid in cul-de-sac.  IMPRESSION: Small amount of free fluid in the cul-de-sac. Question recent ovarian cyst rupture. No intrauterine or extrauterine mass identified. No evidence of ovarian torsion. Endometrium appears  within normal limits.   Electronically Signed   By: Bretta Bang III M.D.   On: 07/12/2014 09:57   [place] Art/ven Flow Abd Pelv Doppler  07/12/2014   CLINICAL DATA:  Four day history of pelvic pain bilaterally  EXAM: TRANSABDOMINAL AND TRANSVAGINAL ULTRASOUND OF PELVIS  DOPPLER ULTRASOUND OF OVARIES  TECHNIQUE: Study was performed transabdominally to optimize pelvic field of view evaluation and transvaginally to optimize internal visceral architecture evaluation.  Color and duplex Doppler ultrasound was utilized to evaluate blood flow to the ovaries.  COMPARISON:  CT abdomen and pelvis May 22, 2014  FINDINGS: Uterus  Measurements: 8.6 x 3.7 x 6.1 cm. No fibroids or other mass visualized.  Endometrium  Thickness: 11 mm.  No focal abnormality visualized.  Right ovary  Measurements: 4.0 x 2.1 x 2.8 cm. Normal appearance/no adnexal mass.  Left ovary  Measurements: 3.3 x 1.2 x 1.9 cm. Normal appearance/no adnexal mass.  Pulsed Doppler evaluation of both ovaries demonstrates normal low-resistance arterial and venous waveforms. The peak systolic velocity in the left ovary is 11.8 centimeter/second with an end-diastolic velocity of 5.6 centimeter/second. The peak systolic velocity in the right ovary is 7.6 centimeter/second with an end-diastolic velocity of 5.2 centimeter/second.  Other findings  Small amount of free fluid in cul-de-sac.  IMPRESSION: Small amount of free fluid in the cul-de-sac. Question recent ovarian cyst rupture. No intrauterine or extrauterine mass identified. No evidence of ovarian torsion. Endometrium appears within normal limits.   Electronically Signed   By: Bretta Bang III M.D.   On: 07/12/2014 09:57     EKG Interpretation None      MDM   Final diagnoses:  Pelvic pain in female  Patient presents for intermittent sharp abdominal pain that began 2-3 days ago with nausea. She has some suprapubic tenderness.  Labs are unremarkable and she does not have a UTI.  Her wet prep is  normal.  I do not suspect a kidney stone since she does not have hematuria on UA. [place] of pelvis is negative for ovarian torsion.  She does have a questionable recent ovarian cyst rupture. That is what is likely causing her pain that is going into her back.  Her vitals are stable. She has been sleeping comfortably during her entire visit. I woke her up several times during her stay from deep sleep. I discussed the findings with her and she agrees to follow up at the women's clinic.      Catha Gosselin, PA-C 07/12/14 1618  Mancel Bale, MD 07/13/14 [phone]

## 2014-07-12 NOTE — ED Notes (Signed)
Pt called to triage but no answer

## 2014-07-12 NOTE — Discharge Instructions (Signed)
Pelvic Pain Follow up with Women's Health.  Take tylenol or motrin for pain.  Pelvic pain is pain felt below the belly button and between your hips. It can be caused by many different things. It is important to get help right away. This is especially true for severe, sharp, or unusual pain that comes on suddenly.  HOME CARE  Only take medicine as told by your doctor.  Rest as told by your doctor.  Eat a healthy diet, such as fruits, vegetables, and lean meats.  Drink enough fluids to keep your pee (urine) clear or pale yellow, or as told.  Avoid sex (intercourse) if it causes pain.  Apply warm or cold packs to your lower belly (abdomen). Use the type of pack that helps the pain.  Avoid situations that cause you stress.  Keep a journal to track your pain. Write down:  When the pain started.  Where it is located.  If there are things that seem to be related to the pain, such as food or your period.  Follow up with your doctor as told. GET HELP RIGHT AWAY IF:   You have heavy bleeding from the vagina.  You have more pelvic pain.  You feel lightheaded or pass out (faint).  You have chills.  You have pain when you pee or have blood in your pee.  You cannot stop having watery poop (diarrhea).  You cannot stop throwing up (vomiting).  You have a fever or lasting symptoms for more than 3 days.  You have a fever and your symptoms suddenly get worse.  You are being physically or sexually abused.  Your medicine does not help your pain.  You have fluid (discharge) coming from your vagina that is not normal. MAKE SURE YOU:  Understand these instructions.  Will watch your condition.  Will get help if you are not doing well or get worse. Document Released: 08/17/2007 Document Revised: 08/30/2011 Document Reviewed: 06/20/2011 [facility] Patient Information 2015 [place]Care, [place]. This information is not intended to replace advice given to you by your health care provider.  Make sure you discuss any questions you have with your health care provider.

## 2014-07-12 NOTE — ED Notes (Signed)
Pt from home c/o lower abdominal pain and low back pain x several days. Denies urinary issues. Denies fever.

## 2014-07-14 LAB — GC/CHLAMYDIA PROBE AMP (~~LOC~~) NOT AT ARMC
CHLAMYDIA, DNA PROBE: NEGATIVE
NEISSERIA GONORRHEA: NEGATIVE

## 2014-09-09 ENCOUNTER — Emergency Department (HOSPITAL_COMMUNITY)
Admission: EM | Admit: 2014-09-09 | Discharge: 2014-09-09 | Disposition: A | Discharge status: Home / Self Care | Payer: Medicaid Other | Attending: Emergency Medicine | Admitting: Emergency Medicine

## 2014-09-09 ENCOUNTER — Encounter (HOSPITAL_COMMUNITY): Payer: Self-pay | Admitting: Emergency Medicine

## 2014-09-09 DIAGNOSIS — Z8619 Personal history of other infectious and parasitic diseases: Secondary | ICD-10-CM | POA: Insufficient documentation

## 2014-09-09 DIAGNOSIS — M545 Low back pain, unspecified: Secondary | ICD-10-CM

## 2014-09-09 DIAGNOSIS — K4090 Unilateral inguinal hernia, without obstruction or gangrene, not specified as recurrent: Secondary | ICD-10-CM | POA: Diagnosis not present

## 2014-09-09 DIAGNOSIS — Z3202 Encounter for pregnancy test, result negative: Secondary | ICD-10-CM | POA: Insufficient documentation

## 2014-09-09 DIAGNOSIS — Z87440 Personal history of urinary (tract) infections: Secondary | ICD-10-CM | POA: Insufficient documentation

## 2014-09-09 DIAGNOSIS — Z720 Tobacco use: Secondary | ICD-10-CM | POA: Insufficient documentation

## 2014-09-09 DIAGNOSIS — R109 Unspecified abdominal pain: Secondary | ICD-10-CM | POA: Diagnosis present

## 2014-09-09 LAB — CBC WITH DIFFERENTIAL/PLATELET
BASOS ABS: 0 10*3/uL (ref 0.0–0.1)
Basophils Relative: 0 % (ref 0–1)
EOS PCT: 2 % (ref 0–5)
Eosinophils Absolute: 0.1 10*3/uL (ref 0.0–0.7)
HCT: 39.4 % (ref 36.0–46.0)
Hemoglobin: 12.9 g/dL (ref 12.0–15.0)
LYMPHS PCT: 57 % — AB (ref 12–46)
Lymphs Abs: 3.1 10*3/uL (ref 0.7–4.0)
MCH: 31.6 pg (ref 26.0–34.0)
MCHC: 32.7 g/dL (ref 30.0–36.0)
MCV: 96.6 fL (ref 78.0–100.0)
MONO ABS: 0.8 10*3/uL (ref 0.1–1.0)
MONOS PCT: 14 % — AB (ref 3–12)
NEUTROS ABS: 1.5 10*3/uL — AB (ref 1.7–7.7)
NEUTROS PCT: 27 % — AB (ref 43–77)
PLATELETS: 190 10*3/uL (ref 150–400)
RBC: 4.08 MIL/uL (ref 3.87–5.11)
RDW: 13.8 % (ref 11.5–15.5)
WBC: 5.5 10*3/uL (ref 4.0–10.5)

## 2014-09-09 LAB — GC/CHLAMYDIA PROBE AMP (~~LOC~~) NOT AT ARMC
CHLAMYDIA, DNA PROBE: NEGATIVE
NEISSERIA GONORRHEA: NEGATIVE

## 2014-09-09 LAB — I-STAT CHEM 8, ED
BUN: 15 mg/dL (ref 6–20)
Calcium, Ion: 1.19 mmol/L (ref 1.12–1.23)
Chloride: 103 mmol/L (ref 101–111)
Creatinine, Ser: 1 mg/dL (ref 0.44–1.00)
Glucose, Bld: 94 mg/dL (ref 65–99)
HEMATOCRIT: 43 % (ref 36.0–46.0)
Hemoglobin: 14.6 g/dL (ref 12.0–15.0)
Potassium: 3.4 mmol/L — ABNORMAL LOW (ref 3.5–5.1)
SODIUM: 140 mmol/L (ref 135–145)
TCO2: 24 mmol/L (ref 0–100)

## 2014-09-09 LAB — WET PREP, GENITAL
Trich, Wet Prep: NONE SEEN
YEAST WET PREP: NONE SEEN

## 2014-09-09 LAB — URINALYSIS, ROUTINE W REFLEX MICROSCOPIC
Bilirubin Urine: NEGATIVE
GLUCOSE, UA: NEGATIVE mg/dL
Hgb urine dipstick: NEGATIVE
KETONES UR: NEGATIVE mg/dL
LEUKOCYTES UA: NEGATIVE
NITRITE: NEGATIVE
Protein, ur: NEGATIVE mg/dL
Specific Gravity, Urine: 1.031 — ABNORMAL HIGH (ref 1.005–1.030)
Urobilinogen, UA: 1 mg/dL (ref 0.0–1.0)
pH: 6 (ref 5.0–8.0)

## 2014-09-09 LAB — PREGNANCY, URINE: Preg Test, Ur: NEGATIVE

## 2014-09-09 MED ORDER — KETOROLAC TROMETHAMINE 30 MG/ML IJ SOLN
30.0000 mg | Freq: Once | INTRAMUSCULAR | Status: AC
  Administered 2014-09-09: 30 mg via INTRAMUSCULAR
  Filled 2014-09-09: qty 1

## 2014-09-09 MED ORDER — IBUPROFEN 600 MG PO TABS: 600.0000 mg | ORAL_TABLET | Freq: Four times a day (QID) | ORAL | Status: DC | PRN

## 2014-09-09 NOTE — ED Provider Notes (Signed)
CSN: [id number]     Arrival date & time 09/09/14  0044 History   First MD Initiated Contact with Patient 09/09/14 0133     Chief Complaint  Patient presents with  . Flank Pain     (Consider location/radiation/quality/duration/timing/severity/associated sxs/prior Treatment) HPI Comments: Patient states she's had bilateral lower back pain for the past several hours.  She is not taking any medication for her discomfort.  She denies any dysuria, but states that occasionally she'll have decreased urinary amount.  Denies any vaginal discharge.  She states she's had a history of kidney infections but it been several years.  Denies any fever or chills  Patient is a 29 y.o. female presenting with flank pain. The history is provided by the patient.  Flank Pain This is a recurrent problem. The current episode started today. The problem occurs constantly. The problem has been unchanged. Pertinent negatives include no abdominal pain, chills, nausea, rash or urinary symptoms. Nothing aggravates the symptoms. She has tried nothing for the symptoms. The treatment provided no relief.    Past Medical History  Diagnosis Date  . UTI (lower urinary tract infection)     pt has h/o Acinetobacter in urine culture-requires Contact Isolation with each hodpitalization  . Chlamydia   . Gonorrhea   . Trichomonas   . Abnormal Pap smear   . Eclampsia    Past Surgical History  Procedure Laterality Date  . Wisdom tooth extraction    . Colposcopy     Family History  Problem Relation Age of Onset  . Cancer Maternal Grandmother    History  Substance Use Topics  . Smoking status: Current Every Day Smoker -- 0.25 packs/day    Types: Cigarettes  . Smokeless tobacco: Never Used  . Alcohol Use: Yes     Comment: occ   OB History    Gravida Para Term Preterm AB TAB SAB Ectopic Multiple Living   5 3 2 1 2 2    2      Review of Systems  Constitutional: Negative for chills.  Gastrointestinal: Negative for  nausea, abdominal pain, diarrhea and constipation.  Genitourinary: Positive for frequency, flank pain and decreased urine volume. Negative for dysuria and vaginal discharge.  Skin: Negative for rash.      Allergies  Review of patient's allergies indicates no known allergies.  Home Medications   Prior to Admission medications   Medication Sig Start Date End Date Taking? Authorizing Provider  Aspirin-Salicylamide-Caffeine (BC FAST PAIN RELIEF) [phone] MG PACK Take 1 packet by mouth daily as needed (for pain and headache).   Yes Historical Provider, MD  chlorthalidone (HYGROTON) 25 MG tablet Take 25 mg by mouth daily.   Yes Historical Provider, MD  cephALEXin (KEFLEX) 500 MG capsule Take 1 capsule (500 mg total) by mouth 4 (four) times daily. Patient not taking: Reported on 07/12/2014 05/22/14   Harle BattiestElizabeth Tysinger, NP  meloxicam (MOBIC) 15 MG tablet Take 1 tablet (15 mg total) by mouth daily. Patient not taking: Reported on 05/22/2014 08/10/13   Vale HavenKeli L Beck, MD  phenazopyridine (PYRIDIUM) 200 MG tablet Take 1 tablet (200 mg total) by mouth 3 (three) times daily. Patient not taking: Reported on 07/12/2014 05/22/14   Harle BattiestElizabeth Tysinger, NP  traMADol (ULTRAM) 50 MG tablet Take 1-2 tablets (50-100 mg total) by mouth every 6 (six) hours as needed for severe pain. Patient not taking: Reported on 05/22/2014 02/03/14   Dorathy KinsmanVirginia Smith, CNM   BP 114/93 mmHg  Pulse 90  Temp(Src) 97.4 F (36.3  C) (Oral)  Resp 16  SpO2 100%  LMP 08/30/2014 Physical Exam  Abdominal: A hernia is present. Hernia confirmed positive in the left inguinal area. Hernia confirmed negative in the right inguinal area.  Genitourinary: Vagina normal and uterus normal. Cervix exhibits no discharge. Right adnexum displays no tenderness. Left adnexum displays no tenderness. No erythema or bleeding in the vagina. No signs of injury around the vagina. No vaginal discharge found.  Musculoskeletal: Normal range of motion.    ED  Course  Procedures (including critical care time) Labs Review Labs Reviewed  WET PREP, GENITAL  URINALYSIS, ROUTINE W REFLEX MICROSCOPIC (NOT AT [facility])  PREGNANCY, URINE  GC/CHLAMYDIA PROBE AMP ([place]) NOT AT [facility]    Imaging Review No results found.   EKG Interpretation None     patient is having a hard time staying awake during examination Examination is noncontributory labs have been reviewed.  Urine is been reviewed, wet prep and pelvic smears reviewed all within normal parameters.  Patient was given Toradol in the emergency department for her discomfort.  She has slept the remainder of the time here.  She been given a prescription for ibuprofen position to follow-up with her primary care physician if needed MDM   Final diagnoses:  None         Earley Favor, NP 09/09/14 0505  Paula Libra, MD 09/09/14 [phone]

## 2014-09-09 NOTE — Discharge Instructions (Signed)
Back Exercises Back exercises help treat and prevent back injuries. The goal is to increase your strength in your belly (abdominal) and back muscles. These exercises can also help with flexibility. Start these exercises when told by your doctor. HOME CARE Back exercises include: Pelvic Tilt.  Lie on your back with your knees bent. Tilt your pelvis until the lower part of your back is against the floor. Hold this position 5 to 10 sec. Repeat this exercise 5 to 10 times. Knee to Chest.  Pull 1 knee up against your chest and hold for 20 to 30 seconds. Repeat this with the other knee. This may be done with the other leg straight or bent, whichever feels better. Then, pull both knees up against your chest. Sit-Ups or Curl-Ups.  Bend your knees 90 degrees. Start with tilting your pelvis, and do a partial, slow sit-up. Only lift your upper half 30 to 45 degrees off the floor. Take at least 2 to 3 seonds for each sit-up. Do not do sit-ups with your knees out straight. If partial sit-ups are difficult, simply do the above but with only tightening your belly (abdominal) muscles and holding it as told. Hip-Lift.  Lie on your back with your knees flexed 90 degrees. Push down with your feet and shoulders as you raise your hips 2 inches off the floor. Hold for 10 seconds, repeat 5 to 10 times. Back Arches.  Lie on your stomach. Prop yourself up on bent elbows. Slowly press on your hands, causing an arch in your low back. Repeat 3 to 5 times. Shoulder-Lifts.  Lie face down with arms beside your body. Keep hips and belly pressed to floor as you slowly lift your head and shoulders off the floor. Do not overdo your exercises. Be careful in the beginning. Exercises may cause you some mild back discomfort. If the pain lasts for more than 15 minutes, stop the exercises until you see your doctor. Improvement with exercise for back problems is slow.  Document Released: 04/02/2010 Document Revised: 05/23/2011  Document Reviewed: 12/30/2010 [facility] Patient Information 2015 [place]ExitCare, [place]LLC. This information is not intended to replace advice given to you by your health care provider. Make sure you discuss any questions you have with your health care provider. Tonight your evaluation does not feel reveal an exact cause for your low back pain.  He do not have a urinary tract infection.  Your electrolytes and blood work were all within normal parameters.  2.  Pelvic exam was normal, not showing any sign of infection.  Please make an appointment with your primary care physician for further follow-up

## 2014-09-09 NOTE — ED Notes (Signed)
Patient is resting comfortably. Family at bedside. Call light within reach. Pt vital signs WNL

## 2014-09-09 NOTE — ED Notes (Signed)
Pt states that she has had L sided flank pain w/o dysuria but has had frequent urination and oliguria. Also reports R sided knee swelling . Alert and oriented.

## 2014-10-03 ENCOUNTER — Encounter (HOSPITAL_COMMUNITY): Payer: Self-pay | Admitting: Emergency Medicine

## 2014-10-03 ENCOUNTER — Emergency Department (HOSPITAL_COMMUNITY)
Admission: EM | Admit: 2014-10-03 | Discharge: 2014-10-04 | Disposition: A | Discharge status: Home / Self Care | Payer: Medicaid Other | Attending: Emergency Medicine | Admitting: Emergency Medicine

## 2014-10-03 DIAGNOSIS — Z3A01 Less than 8 weeks gestation of pregnancy: Secondary | ICD-10-CM | POA: Insufficient documentation

## 2014-10-03 DIAGNOSIS — F17210 Nicotine dependence, cigarettes, uncomplicated: Secondary | ICD-10-CM | POA: Diagnosis not present

## 2014-10-03 DIAGNOSIS — Z79899 Other long term (current) drug therapy: Secondary | ICD-10-CM | POA: Insufficient documentation

## 2014-10-03 DIAGNOSIS — O2341 Unspecified infection of urinary tract in pregnancy, first trimester: Secondary | ICD-10-CM | POA: Insufficient documentation

## 2014-10-03 DIAGNOSIS — Z3490 Encounter for supervision of normal pregnancy, unspecified, unspecified trimester: Secondary | ICD-10-CM

## 2014-10-03 DIAGNOSIS — O99331 Smoking (tobacco) complicating pregnancy, first trimester: Secondary | ICD-10-CM | POA: Diagnosis not present

## 2014-10-03 DIAGNOSIS — O9989 Other specified diseases and conditions complicating pregnancy, childbirth and the puerperium: Secondary | ICD-10-CM | POA: Diagnosis present

## 2014-10-03 DIAGNOSIS — O98811 Other maternal infectious and parasitic diseases complicating pregnancy, first trimester: Secondary | ICD-10-CM | POA: Insufficient documentation

## 2014-10-03 DIAGNOSIS — R102 Pelvic and perineal pain: Secondary | ICD-10-CM

## 2014-10-03 DIAGNOSIS — A599 Trichomoniasis, unspecified: Secondary | ICD-10-CM

## 2014-10-03 DIAGNOSIS — N390 Urinary tract infection, site not specified: Secondary | ICD-10-CM

## 2014-10-03 MED ORDER — ONDANSETRON 4 MG PO TBDP
4.0000 mg | ORAL_TABLET | Freq: Once | ORAL | Status: AC | PRN
  Filled 2014-10-03: qty 1

## 2014-10-03 NOTE — ED Notes (Signed)
Pt arrived to the ED with a complaint of abdominal pain.  Pt states the pain is located upper medial quadrant.  Pt states the pain has been present for two days.  Pt is nauseated but denies emesis or diarrhea.  Pt also is complaining of lower back pain that has been present for three weeks.  Pt states that the pain is bilateral and radiates up her spine.

## 2014-10-04 ENCOUNTER — Emergency Department (HOSPITAL_COMMUNITY): Payer: Medicaid Other

## 2014-10-04 LAB — URINALYSIS, ROUTINE W REFLEX MICROSCOPIC
BILIRUBIN URINE: NEGATIVE
Glucose, UA: NEGATIVE mg/dL
Ketones, ur: NEGATIVE mg/dL
Nitrite: POSITIVE — AB
PH: 6 (ref 5.0–8.0)
Protein, ur: NEGATIVE mg/dL
Specific Gravity, Urine: 1.017 (ref 1.005–1.030)
UROBILINOGEN UA: 0.2 mg/dL (ref 0.0–1.0)

## 2014-10-04 LAB — WET PREP, GENITAL: Yeast Wet Prep HPF POC: NONE SEEN

## 2014-10-04 LAB — COMPREHENSIVE METABOLIC PANEL
ALT: 14 U/L (ref 14–54)
AST: 17 U/L (ref 15–41)
Albumin: 4.2 g/dL (ref 3.5–5.0)
Alkaline Phosphatase: 48 U/L (ref 38–126)
Anion gap: 9 (ref 5–15)
BILIRUBIN TOTAL: 0.4 mg/dL (ref 0.3–1.2)
BUN: 7 mg/dL (ref 6–20)
CHLORIDE: 102 mmol/L (ref 101–111)
CO2: 26 mmol/L (ref 22–32)
CREATININE: 0.75 mg/dL (ref 0.44–1.00)
Calcium: 9.4 mg/dL (ref 8.9–10.3)
GFR calc Af Amer: >60 mL/min (ref >60)
GFR calc non Af Amer: >60 mL/min (ref >60)
Glucose, Bld: 88 mg/dL (ref 65–99)
Potassium: 3.6 mmol/L (ref 3.5–5.1)
Sodium: 137 mmol/L (ref 135–145)
Total Protein: 8.4 g/dL — ABNORMAL HIGH (ref 6.5–8.1)

## 2014-10-04 LAB — CBC
HEMATOCRIT: 40 % (ref 36.0–46.0)
HEMOGLOBIN: 13.7 g/dL (ref 12.0–15.0)
MCH: 32 pg (ref 26.0–34.0)
MCHC: 34.3 g/dL (ref 30.0–36.0)
MCV: 93.5 fL (ref 78.0–100.0)
PLATELETS: 231 10*3/uL (ref 150–400)
RBC: 4.28 MIL/uL (ref 3.87–5.11)
RDW: 13.8 % (ref 11.5–15.5)
WBC: 10.6 10*3/uL — AB (ref 4.0–10.5)

## 2014-10-04 LAB — LIPASE, BLOOD: Lipase: 12 U/L — ABNORMAL LOW (ref 22–51)

## 2014-10-04 LAB — HIV ANTIBODY (ROUTINE TESTING W REFLEX): HIV Screen 4th Generation wRfx: NONREACTIVE

## 2014-10-04 LAB — HCG, QUANTITATIVE, PREGNANCY: hCG, Beta Chain, Quant, S: 23365 m[IU]/mL — ABNORMAL HIGH (ref <5)

## 2014-10-04 LAB — POC URINE PREG, ED: Preg Test, Ur: POSITIVE — AB

## 2014-10-04 LAB — URINE MICROSCOPIC-ADD ON

## 2014-10-04 LAB — RPR: RPR: NONREACTIVE

## 2014-10-04 LAB — ABO/RH: ABO/RH(D): O POS

## 2014-10-04 MED ORDER — ACETAMINOPHEN 500 MG PO TABS
1000.0000 mg | ORAL_TABLET | Freq: Once | ORAL | Status: AC
  Administered 2014-10-04: 1000 mg via ORAL
  Filled 2014-10-04: qty 2

## 2014-10-04 MED ORDER — CEPHALEXIN 250 MG PO CAPS: 250.0000 mg | ORAL_CAPSULE | Freq: Four times a day (QID) | ORAL | Status: DC

## 2014-10-04 MED ORDER — METRONIDAZOLE 500 MG PO TABS
2000.0000 mg | ORAL_TABLET | Freq: Once | ORAL | Status: AC
  Administered 2014-10-04: 2000 mg via ORAL
  Filled 2014-10-04: qty 4

## 2014-10-04 NOTE — ED Provider Notes (Signed)
CSN: [id number]     Arrival date & time 10/03/14  2300 History   First MD Initiated Contact with Patient 10/04/14 0154     Chief Complaint  Patient presents with  . Abdominal Pain  . Back Pain     (Consider location/radiation/quality/duration/timing/severity/associated sxs/prior Treatment) HPI Comments: Patient with a complaint of low back pain 2-3 weeks earlier. Midline low back without injury. Worse with movement. Also complains of epigastric pain x 2 days with nausea, vomiting x 1 on day one. No nausea today. No cough, congestion, weakness, radiculopathy. She reports frequency of urination without fever or hematuria. She denies vaginal bleeding or discharge.   Patient is a 29 y.o. female presenting with abdominal pain and back pain.  Abdominal Pain Associated symptoms: nausea   Associated symptoms: no chills, no fever and no vomiting   Back Pain Associated symptoms: abdominal pain   Associated symptoms: no fever     Past Medical History  Diagnosis Date  . UTI (lower urinary tract infection)     pt has h/o Acinetobacter in urine culture-requires Contact Isolation with each hodpitalization  . Chlamydia   . Gonorrhea   . Trichomonas   . Abnormal Pap smear   . Eclampsia    Past Surgical History  Procedure Laterality Date  . Wisdom tooth extraction    . Colposcopy     Family History  Problem Relation Age of Onset  . Cancer Maternal Grandmother    History  Substance Use Topics  . Smoking status: Current Every Day Smoker -- 0.25 packs/day    Types: Cigarettes  . Smokeless tobacco: Never Used  . Alcohol Use: Yes     Comment: occ   OB History    Gravida Para Term Preterm AB TAB SAB Ectopic Multiple Living   5 3 2 1 2 2    2      Review of Systems  Constitutional: Negative for fever and chills.  Respiratory: Negative.   Cardiovascular: Negative.   Gastrointestinal: Positive for nausea and abdominal pain. Negative for vomiting.  Genitourinary: Positive for frequency.   Musculoskeletal: Positive for back pain.  Skin: Negative.   Neurological: Negative.       Allergies  Review of patient's allergies indicates no known allergies.  Home Medications   Prior to Admission medications   Medication Sig Start Date End Date Taking? Authorizing Provider  Aspirin-Salicylamide-Caffeine (BC FAST PAIN RELIEF) [phone] MG PACK Take 1 packet by mouth daily as needed (for pain and headache).   Yes Historical Provider, MD  chlorthalidone (HYGROTON) 25 MG tablet Take 25 mg by mouth daily.   Yes Historical Provider, MD  ibuprofen (ADVIL,MOTRIN) 200 MG tablet Take 800 mg by mouth every 6 (six) hours as needed (for pain.).   Yes Historical Provider, MD  cephALEXin (KEFLEX) 500 MG capsule Take 1 capsule (500 mg total) by mouth 4 (four) times daily. Patient not taking: Reported on 07/12/2014 05/22/14   Harle Battiest, NP  ibuprofen (ADVIL,MOTRIN) 600 MG tablet Take 1 tablet (600 mg total) by mouth every 6 (six) hours as needed. Patient not taking: Reported on 10/03/2014 09/09/14   Earley Favor, NP  meloxicam (MOBIC) 15 MG tablet Take 1 tablet (15 mg total) by mouth daily. Patient not taking: Reported on 05/22/2014 08/10/13   Vale Haven, MD  phenazopyridine (PYRIDIUM) 200 MG tablet Take 1 tablet (200 mg total) by mouth 3 (three) times daily. Patient not taking: Reported on 07/12/2014 05/22/14   Harle Battiest, NP  traMADol (ULTRAM) 50 MG  tablet Take 1-2 tablets (50-100 mg total) by mouth every 6 (six) hours as needed for severe pain. Patient not taking: Reported on 05/22/2014 02/03/14   Dorathy Kinsman, CNM   BP 104/65 mmHg  Pulse 102  Temp(Src) 98.3 F (36.8 C) (Oral)  Resp 20  SpO2 100%  LMP 08/30/2014 Physical Exam  Constitutional: She is oriented to person, place, and time. She appears well-developed and well-nourished.  HENT:  Head: Normocephalic.  Neck: Normal range of motion. Neck supple.  Cardiovascular: Normal rate and regular rhythm.   Pulmonary/Chest:  Effort normal and breath sounds normal. She has no wheezes. She has no rales.  Abdominal: Soft. Bowel sounds are normal. There is tenderness. There is no rebound and no guarding.  Tender epigastrium and across lower abdomen. Non-distended.  Genitourinary:  White vaginal discharge. Cervix normal appearing, non-tender. Right adnexal tenderness without mass. Left adnexa non-tender without mass.   Musculoskeletal: Normal range of motion.  Neurological: She is alert and oriented to person, place, and time.  Skin: Skin is warm and dry. No rash noted.  Psychiatric: She has a normal mood and affect.    ED Course  Procedures (including critical care time) Labs Review Labs Reviewed  LIPASE, BLOOD - Abnormal; Notable for the following:    Lipase 12 (*)    All other components within normal limits  COMPREHENSIVE METABOLIC PANEL - Abnormal; Notable for the following:    Total Protein 8.4 (*)    All other components within normal limits  CBC - Abnormal; Notable for the following:    WBC 10.6 (*)    All other components within normal limits  POC URINE PREG, ED - Abnormal; Notable for the following:    Preg Test, Ur POSITIVE (*)    All other components within normal limits  URINALYSIS, ROUTINE W REFLEX MICROSCOPIC (NOT AT [facility])   Results for orders placed or performed during the hospital encounter of 10/03/14  Wet prep, genital  Result Value Ref Range   Yeast Wet Prep HPF POC NONE SEEN NONE SEEN   Trich, Wet Prep MANY (A) NONE SEEN   Clue Cells Wet Prep HPF POC FEW (A) NONE SEEN   WBC, Wet Prep HPF POC FEW (A) NONE SEEN  Lipase, blood  Result Value Ref Range   Lipase 12 (L) 22 - 51 U/L  Comprehensive metabolic panel  Result Value Ref Range   Sodium 137 135 - 145 mmol/L   Potassium 3.6 3.5 - 5.1 mmol/L   Chloride 102 101 - 111 mmol/L   CO2 26 22 - 32 mmol/L   Glucose, Bld 88 65 - 99 mg/dL   BUN 7 6 - 20 mg/dL   Creatinine, Ser 1.61 0.44 - 1.00 mg/dL   Calcium 9.4 8.9 - 09.6 mg/dL    Total Protein 8.4 (H) 6.5 - 8.1 g/dL   Albumin 4.2 3.5 - 5.0 g/dL   AST 17 15 - 41 U/L   ALT 14 14 - 54 U/L   Alkaline Phosphatase 48 38 - 126 U/L   Total Bilirubin 0.4 0.3 - 1.2 mg/dL   GFR calc non Af Amer >60 >60 mL/min   GFR calc Af Amer >60 >60 mL/min   Anion gap 9 5 - 15  CBC  Result Value Ref Range   WBC 10.6 (H) 4.0 - 10.5 K/uL   RBC 4.28 3.87 - 5.11 MIL/uL   Hemoglobin 13.7 12.0 - 15.0 g/dL   HCT 04.5 40.9 - 81.1 %   MCV 93.5 78.0 -  100.0 fL   MCH 32.0 26.0 - 34.0 pg   MCHC 34.3 30.0 - 36.0 g/dL   RDW 16.1 09.6 - 04.5 %   Platelets 231 150 - 400 K/uL  Urinalysis, Routine w reflex microscopic (not at [facility])  Result Value Ref Range   Color, Urine YELLOW YELLOW   APPearance TURBID (A) CLEAR   Specific Gravity, Urine 1.017 1.005 - 1.030   pH 6.0 5.0 - 8.0   Glucose, UA NEGATIVE NEGATIVE mg/dL   Hgb urine dipstick TRACE (A) NEGATIVE   Bilirubin Urine NEGATIVE NEGATIVE   Ketones, ur NEGATIVE NEGATIVE mg/dL   Protein, ur NEGATIVE NEGATIVE mg/dL   Urobilinogen, UA 0.2 0.0 - 1.0 mg/dL   Nitrite POSITIVE (A) NEGATIVE   Leukocytes, UA MODERATE (A) NEGATIVE  Urine microscopic-add on  Result Value Ref Range   Squamous Epithelial / LPF RARE RARE   WBC, UA 21-50 <3 WBC/hpf   RBC / HPF 0-2 <3 RBC/hpf   Bacteria, UA MANY (A) RARE   Urine-Other MUCOUS PRESENT   hCG, quantitative, pregnancy  Result Value Ref Range   hCG, Beta Chain, Quant, S 23365 (H) <5 mIU/mL  POC urine preg, ED (not at [facility])  Result Value Ref Range   Preg Test, Ur POSITIVE (A) NEGATIVE  ABO/Rh  Result Value Ref Range   ABO/RH(D) O POS     Imaging Review No results found.   EKG Interpretation None      MDM   Final diagnoses:  None    1. Pregnant 2. Pelvic pain  Patient has been sleeping on multiple re-evaluations without distress or obvious discomfort.   Nausea controlled. No vomiting. Positive for trichomonas on wet prep, treated in ED with Flagyl. Patient offered ultrasound to evaluate  for ectopic pregnancy. Patient care transferred to [facility], PA-C, pending ultrasound of pelvis.    Elpidio Anis, PA-C 10/04/14 [id number]  Zadie Rhine, MD 10/04/14 [phone]

## 2014-10-04 NOTE — ED Provider Notes (Signed)
7:30 AM Alicia Castro seen and examined by me. Alicia Castro signed out at shift change pending US pelvis for positive preg test and pain on pelvic exam. Pts labs showed urine infection, trichomonal infection for which she received flagyl in ED.   Results for orders placed or performed during the hospital encounter of 10/03/14  Wet prep, genital  Result Value Ref Range   Yeast Wet Prep HPF POC NONE SEEN NONE SEEN   Trich, Wet Prep MANY (A) NONE SEEN   Clue Cells Wet Prep HPF POC FEW (A) NONE SEEN   WBC, Wet Prep HPF POC FEW (A) NONE SEEN  Lipase, blood  Result Value Ref Range   Lipase 12 (L) 22 - 51 U/L  Comprehensive metabolic panel  Result Value Ref Range   Sodium 137 135 - 145 mmol/L   Potassium 3.6 3.5 - 5.1 mmol/L   Chloride 102 101 - 111 mmol/L   CO2 26 22 - 32 mmol/L   Glucose, Bld 88 65 - 99 mg/dL   BUN 7 6 - 20 mg/dL   Creatinine, Ser 1.61 0.44 - 1.00 mg/dL   Calcium 9.4 8.9 - 09.6 mg/dL   Total Protein 8.4 (H) 6.5 - 8.1 g/dL   Albumin 4.2 3.5 - 5.0 g/dL   AST 17 15 - 41 U/L   ALT 14 14 - 54 U/L   Alkaline Phosphatase 48 38 - 126 U/L   Total Bilirubin 0.4 0.3 - 1.2 mg/dL   GFR calc non Af Amer >60 >60 mL/min   GFR calc Af Amer >60 >60 mL/min   Anion gap 9 5 - 15  CBC  Result Value Ref Range   WBC 10.6 (H) 4.0 - 10.5 K/uL   RBC 4.28 3.87 - 5.11 MIL/uL   Hemoglobin 13.7 12.0 - 15.0 g/dL   HCT 04.5 40.9 - 81.1 %   MCV 93.5 78.0 - 100.0 fL   MCH 32.0 26.0 - 34.0 pg   MCHC 34.3 30.0 - 36.0 g/dL   RDW 91.4 78.2 - 95.6 %   Platelets 231 150 - 400 K/uL  Urinalysis, Routine w reflex microscopic (not at [facility])  Result Value Ref Range   Color, Urine YELLOW YELLOW   APPearance TURBID (A) CLEAR   Specific Gravity, Urine 1.017 1.005 - 1.030   pH 6.0 5.0 - 8.0   Glucose, UA NEGATIVE NEGATIVE mg/dL   Hgb urine dipstick TRACE (A) NEGATIVE   Bilirubin Urine NEGATIVE NEGATIVE   Ketones, ur NEGATIVE NEGATIVE mg/dL   Protein, ur NEGATIVE NEGATIVE mg/dL   Urobilinogen, UA 0.2 0.0 - 1.0 mg/dL   Nitrite POSITIVE (A) NEGATIVE   Leukocytes, UA MODERATE (A) NEGATIVE  Urine microscopic-add on  Result Value Ref Range   Squamous Epithelial / LPF RARE RARE   WBC, UA 21-50 <3 WBC/hpf   RBC / HPF 0-2 <3 RBC/hpf   Bacteria, UA MANY (A) RARE   Urine-Other MUCOUS PRESENT   hCG, quantitative, pregnancy  Result Value Ref Range   hCG, Beta Chain, Quant, S 23365 (H) <5 mIU/mL  POC urine preg, ED (not at [facility])  Result Value Ref Range   Preg Test, Ur POSITIVE (A) NEGATIVE  ABO/Rh  Result Value Ref Range   ABO/RH(D) O POS    US Ob Comp Less 14 Wks  10/04/2014   CLINICAL DATA:  Pregnant patient with pelvic pain. Quantitative HCG 23,365.  EXAM: OBSTETRIC <14 WK [place] AND TRANSVAGINAL OB US  TECHNIQUE: Both transabdominal and transvaginal ultrasound examinations were performed for complete  evaluation of the gestation as well as the maternal uterus, adnexal regions, and pelvic cul-de-sac. Transvaginal technique was performed to assess early pregnancy.  COMPARISON:  None.  FINDINGS: Intrauterine gestational sac: Visualized/normal in shape.  Yolk sac:  Visualized.  Embryo:  Visualized.  Cardiac Activity: Detected.  Heart Rate: 86  bpm  CRL:  2  mm   5 w   5 d                  [place] EDC: 06/01/2015  Maternal uterus/adnexae: The left ovary is not visualized. The right ovary is unremarkable. Incidental note is made that debris is seen within the urinary bladder.  IMPRESSION: Single living intrauterine pregnancy without complicating feature.  Debris within the urinary bladder may be secondary to infection.   Electronically Signed   By: Drusilla Kanner M.D.   On: 10/04/2014 07:20   US Ob Transvaginal  10/04/2014   CLINICAL DATA:  Pregnant patient with pelvic pain. Quantitative HCG 23,365.  EXAM: OBSTETRIC <14 WK [place] AND TRANSVAGINAL OB US  TECHNIQUE: Both transabdominal and transvaginal ultrasound examinations were performed for complete evaluation of the gestation as well as the maternal uterus, adnexal regions, and  pelvic cul-de-sac. Transvaginal technique was performed to assess early pregnancy.  COMPARISON:  None.  FINDINGS: Intrauterine gestational sac: Visualized/normal in shape.  Yolk sac:  Visualized.  Embryo:  Visualized.  Cardiac Activity: Detected.  Heart Rate: 86  bpm  CRL:  2  mm   5 w   5 d                  [place] EDC: 06/01/2015  Maternal uterus/adnexae: The left ovary is not visualized. The right ovary is unremarkable. Incidental note is made that debris is seen within the urinary bladder.  IMPRESSION: Single living intrauterine pregnancy without complicating feature.  Debris within the urinary bladder may be secondary to infection.   Electronically Signed   By: Drusilla Kanner M.D.   On: 10/04/2014 07:20    Alicia Castro's US showing intrauterine preg at [redacted]w[redacted]d with no complications. Plan to dc home on keflex. Tylenol for pain. Prenatal vitamins. Follow up with GYN. Alicia Castro agreed to the plan.   Filed Vitals:   10/03/14 2323  BP: 104/65  Pulse: 102  Temp: 98.3 F (36.8 C)  TempSrc: Oral  Resp: 20  SpO2: 100%     Jaynie Crumble, PA-C 10/04/14 1521  Zadie Rhine, MD 10/07/14 2326

## 2014-10-04 NOTE — ED Notes (Signed)
Patient transported to Ultrasound 

## 2014-10-04 NOTE — Discharge Instructions (Signed)
Your ultrasound showed normal pregnancy estimated at 5weeks and 5 days. Your pelvic exam showed trichomonus infection for which you were treated. Make sure your partner is treated as well. Take keflex as prescribed until all gone for UTI. Follow up with your OB/GYN specialist as soon as able.   Abdominal Pain During Pregnancy Abdominal pain is common in pregnancy. Most of the time, it does not cause harm. There are many causes of abdominal pain. Some causes are more serious than others. Some of the causes of abdominal pain in pregnancy are easily diagnosed. Occasionally, the diagnosis takes time to understand. Other times, the cause is not determined. Abdominal pain can be a sign that something is very wrong with the pregnancy, or the pain may have nothing to do with the pregnancy at all. For this reason, always tell your health care provider if you have any abdominal discomfort. HOME CARE INSTRUCTIONS  Monitor your abdominal pain for any changes. The following actions may help to alleviate any discomfort you are experiencing:  Do not have sexual intercourse or put anything in your vagina until your symptoms go away completely.  Get plenty of rest until your pain improves.  Drink clear fluids if you feel nauseous. Avoid solid food as long as you are uncomfortable or nauseous.  Only take over-the-counter or prescription medicine as directed by your health care provider.  Keep all follow-up appointments with your health care provider. SEEK IMMEDIATE MEDICAL CARE IF:  You are bleeding, leaking fluid, or passing tissue from the vagina.  You have increasing pain or cramping.  You have persistent vomiting.  You have painful or bloody urination.  You have a fever.  You notice a decrease in your baby's movements.  You have extreme weakness or feel faint.  You have shortness of breath, with or without abdominal pain.  You develop a severe headache with abdominal pain.  You have abnormal  vaginal discharge with abdominal pain.  You have persistent diarrhea.  You have abdominal pain that continues even after rest, or gets worse. MAKE SURE YOU:   Understand these instructions.  Will watch your condition.  Will get help right away if you are not doing well or get worse. Document Released: 02/28/2005 Document Revised: 12/19/2012 Document Reviewed: 09/27/2012 [facility] Patient Information 2015 [place], [place]. This information is not intended to replace advice given to you by your health care provider. Make sure you discuss any questions you have with your health care provider.  Urinary Tract Infection Urinary tract infections (UTIs) can develop anywhere along your urinary tract. Your urinary tract is your body's drainage system for removing wastes and extra water. Your urinary tract includes two kidneys, two ureters, a bladder, and a urethra. Your kidneys are a pair of bean-shaped organs. Each kidney is about the size of your fist. They are located below your ribs, one on each side of your spine. CAUSES Infections are caused by microbes, which are microscopic organisms, including fungi, viruses, and bacteria. These organisms are so small that they can only be seen through a microscope. Bacteria are the microbes that most commonly cause UTIs. SYMPTOMS  Symptoms of UTIs may vary by age and gender of the patient and by the location of the infection. Symptoms in young women typically include a frequent and intense urge to urinate and a painful, burning feeling in the bladder or urethra during urination. Older women and men are more likely to be tired, shaky, and weak and have muscle aches and abdominal pain. A  fever may mean the infection is in your kidneys. Other symptoms of a kidney infection include pain in your back or sides below the ribs, nausea, and vomiting. DIAGNOSIS To diagnose a UTI, your caregiver will ask you about your symptoms. Your caregiver also will ask to provide a  urine sample. The urine sample will be tested for bacteria and white blood cells. White blood cells are made by your body to help fight infection. TREATMENT  Typically, UTIs can be treated with medication. Because most UTIs are caused by a bacterial infection, they usually can be treated with the use of antibiotics. The choice of antibiotic and length of treatment depend on your symptoms and the type of bacteria causing your infection. HOME CARE INSTRUCTIONS  If you were prescribed antibiotics, take them exactly as your caregiver instructs you. Finish the medication even if you feel better after you have only taken some of the medication.  Drink enough water and fluids to keep your urine clear or pale yellow.  Avoid caffeine, tea, and carbonated beverages. They tend to irritate your bladder.  Empty your bladder often. Avoid holding urine for long periods of time.  Empty your bladder before and after sexual intercourse.  After a bowel movement, women should cleanse from front to back. Use each tissue only once. SEEK MEDICAL CARE IF:   You have back pain.  You develop a fever.  Your symptoms do not begin to resolve within 3 days. SEEK IMMEDIATE MEDICAL CARE IF:   You have severe back pain or lower abdominal pain.  You develop chills.  You have nausea or vomiting.  You have continued burning or discomfort with urination. MAKE SURE YOU:   Understand these instructions.  Will watch your condition.  Will get help right away if you are not doing well or get worse. Document Released: 12/08/2004 Document Revised: 08/30/2011 Document Reviewed: 04/08/2011 [facility] Patient Information 2015 [place], [place]. This information is not intended to replace advice given to you by your health care provider. Make sure you discuss any questions you have with your health care provider.  Trichomoniasis Trichomoniasis is an infection caused by an organism called Trichomonas. The infection can affect  both women and men. In women, the outer female genitalia and the vagina are affected. In men, the penis is mainly affected, but the prostate and other reproductive organs can also be involved. Trichomoniasis is a sexually transmitted infection (STI) and is most often passed to another person through sexual contact.  RISK FACTORS  Having unprotected sexual intercourse.  Having sexual intercourse with an infected partner. SIGNS AND SYMPTOMS  Symptoms of trichomoniasis in women include:  Abnormal gray-green frothy vaginal discharge.  Itching and irritation of the vagina.  Itching and irritation of the area outside the vagina. Symptoms of trichomoniasis in men include:   Penile discharge with or without pain.  Pain during urination. This results from inflammation of the urethra. DIAGNOSIS  Trichomoniasis may be found during a Pap test or physical exam. Your health care provider may use one of the following methods to help diagnose this infection:  Examining vaginal discharge under a microscope. For men, urethral discharge would be examined.  Testing the pH of the vagina with a test tape.  Using a vaginal swab test that checks for the Trichomonas organism. A test is available that provides results within a few minutes.  Doing a culture test for the organism. This is not usually needed. TREATMENT   You may be given medicine to fight the  infection. Women should inform their health care provider if they could be or are pregnant. Some medicines used to treat the infection should not be taken during pregnancy.  Your health care provider may recommend over-the-counter medicines or creams to decrease itching or irritation.  Your sexual partner will need to be treated if infected. HOME CARE INSTRUCTIONS   Take medicines only as directed by your health care provider.  Take over-the-counter medicine for itching or irritation as directed by your health care provider.  Do not have sexual  intercourse while you have the infection.  Women should not douche or wear tampons while they have the infection.  Discuss your infection with your partner. Your partner may have gotten the infection from you, or you may have gotten it from your partner.  Have your sex partner get examined and treated if necessary.  Practice safe, informed, and protected sex.  See your health care provider for other STI testing. SEEK MEDICAL CARE IF:   You still have symptoms after you finish your medicine.  You develop abdominal pain.  You have pain when you urinate.  You have bleeding after sexual intercourse.  You develop a rash.  Your medicine makes you sick or makes you throw up (vomit). MAKE SURE YOU:  Understand these instructions.  Will watch your condition.  Will get help right away if you are not doing well or get worse. Document Released: 08/24/2000 Document Revised: 07/15/2013 Document Reviewed: 12/10/2012 [facility] Patient Information 2015 [place], [place]. This information is not intended to replace advice given to you by your health care provider. Make sure you discuss any questions you have with your health care provider.

## 2014-10-06 LAB — GC/CHLAMYDIA PROBE AMP (~~LOC~~) NOT AT ARMC
Chlamydia: NEGATIVE
Neisseria Gonorrhea: NEGATIVE

## 2014-11-24 ENCOUNTER — Encounter (HOSPITAL_COMMUNITY): Payer: Self-pay | Admitting: *Deleted

## 2014-11-24 ENCOUNTER — Emergency Department (HOSPITAL_COMMUNITY)
Admission: EM | Admit: 2014-11-24 | Discharge: 2014-11-25 | Disposition: A | Discharge status: Home / Self Care | Payer: Medicaid Other | Attending: Emergency Medicine | Admitting: Emergency Medicine

## 2014-11-24 DIAGNOSIS — Z720 Tobacco use: Secondary | ICD-10-CM | POA: Diagnosis not present

## 2014-11-24 DIAGNOSIS — R519 Headache, unspecified: Secondary | ICD-10-CM

## 2014-11-24 DIAGNOSIS — Z3490 Encounter for supervision of normal pregnancy, unspecified, unspecified trimester: Secondary | ICD-10-CM

## 2014-11-24 DIAGNOSIS — A5909 Other urogenital trichomoniasis: Secondary | ICD-10-CM | POA: Insufficient documentation

## 2014-11-24 DIAGNOSIS — Z79899 Other long term (current) drug therapy: Secondary | ICD-10-CM | POA: Diagnosis not present

## 2014-11-24 DIAGNOSIS — R51 Headache: Secondary | ICD-10-CM | POA: Diagnosis not present

## 2014-11-24 DIAGNOSIS — Z3A13 13 weeks gestation of pregnancy: Secondary | ICD-10-CM | POA: Insufficient documentation

## 2014-11-24 DIAGNOSIS — O98811 Other maternal infectious and parasitic diseases complicating pregnancy, first trimester: Secondary | ICD-10-CM | POA: Insufficient documentation

## 2014-11-24 DIAGNOSIS — O9989 Other specified diseases and conditions complicating pregnancy, childbirth and the puerperium: Secondary | ICD-10-CM | POA: Insufficient documentation

## 2014-11-24 LAB — COMPREHENSIVE METABOLIC PANEL
ALBUMIN: 4 g/dL (ref 3.5–5.0)
ALK PHOS: 46 U/L (ref 38–126)
ALT: 15 U/L (ref 14–54)
AST: 23 U/L (ref 15–41)
Anion gap: 10 (ref 5–15)
BUN: 9 mg/dL (ref 6–20)
CALCIUM: 9.4 mg/dL (ref 8.9–10.3)
CO2: 28 mmol/L (ref 22–32)
Chloride: 99 mmol/L — ABNORMAL LOW (ref 101–111)
Creatinine, Ser: 0.76 mg/dL (ref 0.44–1.00)
GFR calc Af Amer: >60 mL/min (ref >60)
GFR calc non Af Amer: >60 mL/min (ref >60)
GLUCOSE: 77 mg/dL (ref 65–99)
Potassium: 2.8 mmol/L — ABNORMAL LOW (ref 3.5–5.1)
SODIUM: 137 mmol/L (ref 135–145)
Total Bilirubin: 0.2 mg/dL — ABNORMAL LOW (ref 0.3–1.2)
Total Protein: 7.3 g/dL (ref 6.5–8.1)

## 2014-11-24 LAB — CBC
HEMATOCRIT: 36.4 % (ref 36.0–46.0)
Hemoglobin: 12.5 g/dL (ref 12.0–15.0)
MCH: 31.9 pg (ref 26.0–34.0)
MCHC: 34.3 g/dL (ref 30.0–36.0)
MCV: 92.9 fL (ref 78.0–100.0)
Platelets: 205 10*3/uL (ref 150–400)
RBC: 3.92 MIL/uL (ref 3.87–5.11)
RDW: 13.5 % (ref 11.5–15.5)
WBC: 7 10*3/uL (ref 4.0–10.5)

## 2014-11-24 LAB — URINALYSIS, ROUTINE W REFLEX MICROSCOPIC
Bilirubin Urine: NEGATIVE
GLUCOSE, UA: NEGATIVE mg/dL
HGB URINE DIPSTICK: NEGATIVE
Ketones, ur: NEGATIVE mg/dL
Nitrite: NEGATIVE
PH: 6.5 (ref 5.0–8.0)
Protein, ur: NEGATIVE mg/dL
SPECIFIC GRAVITY, URINE: 1.017 (ref 1.005–1.030)
Urobilinogen, UA: 1 mg/dL (ref 0.0–1.0)

## 2014-11-24 LAB — URINE MICROSCOPIC-ADD ON

## 2014-11-24 LAB — POC URINE PREG, ED: Preg Test, Ur: POSITIVE — AB

## 2014-11-24 LAB — OB RESULTS CONSOLE GC/CHLAMYDIA: GC PROBE AMP, GENITAL: NEGATIVE

## 2014-11-24 LAB — WET PREP, GENITAL: Yeast Wet Prep HPF POC: NONE SEEN

## 2014-11-24 LAB — LIPASE, BLOOD: Lipase: 32 U/L (ref 22–51)

## 2014-11-24 LAB — I-STAT BETA HCG BLOOD, ED (MC, WL, AP ONLY): I-stat hCG, quantitative: >2000 m[IU]/mL — ABNORMAL HIGH (ref <5)

## 2014-11-24 MED ORDER — METOCLOPRAMIDE HCL 5 MG/ML IJ SOLN
10.0000 mg | Freq: Once | INTRAMUSCULAR | Status: AC
  Administered 2014-11-24: 10 mg via INTRAMUSCULAR
  Filled 2014-11-24: qty 2

## 2014-11-24 MED ORDER — METRONIDAZOLE 500 MG PO TABS
2000.0000 mg | ORAL_TABLET | Freq: Once | ORAL | Status: AC
  Administered 2014-11-25: 2000 mg via ORAL
  Filled 2014-11-24: qty 4

## 2014-11-24 MED ORDER — POTASSIUM CHLORIDE CRYS ER 20 MEQ PO TBCR
40.0000 meq | EXTENDED_RELEASE_TABLET | Freq: Once | ORAL | Status: AC
  Administered 2014-11-24: 40 meq via ORAL
  Filled 2014-11-24: qty 2

## 2014-11-24 MED ORDER — ACETAMINOPHEN 325 MG PO TABS
650.0000 mg | ORAL_TABLET | Freq: Once | ORAL | Status: AC
  Administered 2014-11-25: 650 mg via ORAL
  Filled 2014-11-24: qty 2

## 2014-11-24 MED ORDER — DIPHENHYDRAMINE HCL 50 MG/ML IJ SOLN
25.0000 mg | Freq: Once | INTRAMUSCULAR | Status: AC
  Administered 2014-11-24: 25 mg via INTRAMUSCULAR
  Filled 2014-11-24: qty 1

## 2014-11-24 NOTE — ED Provider Notes (Signed)
CSN: [id number]     Arrival date & time 11/24/14  1914 History   First MD Initiated Contact with Patient 11/24/14 2151     Chief Complaint  Patient presents with  . Headache  . Abdominal Pain     (Consider location/radiation/quality/duration/timing/severity/associated sxs/prior Treatment) HPI Alicia Castro is a 29 y.o. female with history of UTIs, STD, presents to emergency department complaining of headache, lower abdominal pain. Pt states headache started 2 days ago. States it is mainly in the temples, radiates to the back. She also states she developed lower abdominal pain and lower back pain last night. She denies any fever or chills. No neck pain or stiffness. Denies any urinary symptoms. She does report vaginal discharge. She is [redacted] weeks pregnant. Has not had a prenatal visit yet, however has had confirmed IUP in emergency department 2 months ago. Patient states she has been taking BC powder with no relief.  Past Medical History  Diagnosis Date  . UTI (lower urinary tract infection)     pt has h/o Acinetobacter in urine culture-requires Contact Isolation with each hodpitalization  . Chlamydia   . Gonorrhea   . Trichomonas   . Abnormal Pap smear   . Eclampsia    Past Surgical History  Procedure Laterality Date  . Wisdom tooth extraction    . Colposcopy     Family History  Problem Relation Age of Onset  . Cancer Maternal Grandmother    Social History  Substance Use Topics  . Smoking status: Current Every Day Smoker -- 0.50 packs/day    Types: Cigarettes  . Smokeless tobacco: Never Used  . Alcohol Use: Yes     Comment: occ   OB History    Gravida Para Term Preterm AB TAB SAB Ectopic Multiple Living   5 3 2 1 2 2    2      Review of Systems  Constitutional: Negative for fever and chills.  Respiratory: Negative for cough, chest tightness and shortness of breath.   Cardiovascular: Negative for chest pain, palpitations and leg swelling.  Gastrointestinal:  Positive for abdominal pain. Negative for nausea, vomiting and diarrhea.  Genitourinary: Positive for vaginal discharge and pelvic pain. Negative for dysuria, flank pain, vaginal bleeding and vaginal pain.  Musculoskeletal: Negative for myalgias, arthralgias, neck pain and neck stiffness.  Skin: Negative for rash.  Neurological: Positive for headaches. Negative for dizziness and weakness.  All other systems reviewed and are negative.     Allergies  Review of patient's allergies indicates no known allergies.  Home Medications   Prior to Admission medications   Medication Sig Start Date End Date Taking? Authorizing Provider  Aspirin-Salicylamide-Caffeine (BC FAST PAIN RELIEF) [phone] MG PACK Take 1 packet by mouth daily as needed (for pain and headache).   Yes Historical Provider, MD  chlorthalidone (HYGROTON) 25 MG tablet Take 25 mg by mouth daily.   Yes Historical Provider, MD  ibuprofen (ADVIL,MOTRIN) 200 MG tablet Take 800 mg by mouth every 6 (six) hours as needed (for pain.).   Yes Historical Provider, MD  cephALEXin (KEFLEX) 250 MG capsule Take 1 capsule (250 mg total) by mouth 4 (four) times daily. Patient not taking: Reported on 11/24/2014 10/04/14   Nieves Barberi, PA-C  ibuprofen (ADVIL,MOTRIN) 600 MG tablet Take 1 tablet (600 mg total) by mouth every 6 (six) hours as needed. Patient not taking: Reported on 10/03/2014 09/09/14   Earley Favor, NP  phenazopyridine (PYRIDIUM) 200 MG tablet Take 1 tablet (200 mg total) by mouth  3 (three) times daily. Patient not taking: Reported on 07/12/2014 05/22/14   Harle Battiest, NP  traMADol (ULTRAM) 50 MG tablet Take 1-2 tablets (50-100 mg total) by mouth every 6 (six) hours as needed for severe pain. Patient not taking: Reported on 05/22/2014 02/03/14   Dorathy Kinsman, CNM   BP 115/60 mmHg  Pulse 102  Temp(Src) 98.4 F (36.9 C) (Oral)  Resp 16  SpO2 100%  LMP 09/23/2014 Physical Exam  Constitutional: She is oriented to person,  place, and time. She appears well-developed and well-nourished. No distress.  HENT:  Head: Normocephalic.  Eyes: Conjunctivae are normal.  Neck: Neck supple.  Cardiovascular: Normal rate, regular rhythm and normal heart sounds.   Pulmonary/Chest: Effort normal and breath sounds normal. No respiratory distress. She has no wheezes. She has no rales.  Abdominal: Soft. Bowel sounds are normal. She exhibits no distension. There is tenderness. There is no rebound.  Suprapubic   Genitourinary:  Normal external genitalia. Normal vaginal canal. moderate thin white discharge. Cervix is normal, closed. No CMT. No uterine or adnexal tenderness. Uterus gravid. No masses palpated.    Musculoskeletal: She exhibits no edema.  Neurological: She is alert and oriented to person, place, and time.  Skin: Skin is warm and dry.  Psychiatric: She has a normal mood and affect. Her behavior is normal.  Nursing note and vitals reviewed.   ED Course  Procedures (including critical care time) Labs Review Labs Reviewed  WET PREP, GENITAL - Abnormal; Notable for the following:    Trich, Wet Prep MANY (*)    Clue Cells Wet Prep HPF POC FEW (*)    WBC, Wet Prep HPF POC FEW (*)    All other components within normal limits  COMPREHENSIVE METABOLIC PANEL - Abnormal; Notable for the following:    Potassium 2.8 (*)    Chloride 99 (*)    Total Bilirubin 0.2 (*)    All other components within normal limits  URINALYSIS, ROUTINE W REFLEX MICROSCOPIC (NOT AT [facility]) - Abnormal; Notable for the following:    APPearance CLOUDY (*)    Leukocytes, UA SMALL (*)    All other components within normal limits  URINE MICROSCOPIC-ADD ON - Abnormal; Notable for the following:    Squamous Epithelial / LPF MANY (*)    All other components within normal limits  I-STAT BETA HCG BLOOD, ED ([facility], [facility], [facility] ONLY) - Abnormal; Notable for the following:    I-stat hCG, quantitative >2000.0 (*)    All other components within normal limits  POC  URINE PREG, ED - Abnormal; Notable for the following:    Preg Test, Ur POSITIVE (*)    All other components within normal limits  LIPASE, BLOOD  CBC  GC/CHLAMYDIA PROBE AMP ([place]) NOT AT [facility]    Imaging Review No results found. I have personally reviewed and evaluated these images and lab results as part of my medical decision-making.   EKG Interpretation None      MDM   Final diagnoses:  Trichomonal cervicitis  Nonintractable headache, unspecified chronicity pattern, unspecified headache type  Pregnancy    Patient is 13-[redacted] weeks pregnant, here with lower abdominal pain, back pain, headache. Bedside ultrasound obtained, fetal heart tones 140s. Baby is moving. No guarding on abdominal exam. Labs obtained, normal CBC, hypokalemia with potassium of 2.8 on metabolic panel. Replace with 40 mEq by mouth. Urinalysis showed Trichomonas. Pelvic exam showing many trichomonas, few clue cells, few white blood cells. We'll treat with Flagyl. Patient instructed  to get her partner treated as well. Headache treated with Reglan and Benadryl. We'll reassess.    11:41 PM Pt feeling much better. Plan to dc home with tylenol for headache, potassium, rest. Follow up with pcp or OB/GYN. Pt agrees. Advised partner to be treated for trichomonas.   Filed Vitals:   11/24/14 1956 11/25/14 0020  BP: 115/60 101/63  Pulse: 102 82  Temp: 98.4 F (36.9 C)   TempSrc: Oral   Resp: 16 16  SpO2: 100% 98%       Jaynie Crumble, PA-C 11/25/14 0040  Lorre Nick, MD 11/25/14 2352

## 2014-11-24 NOTE — ED Notes (Signed)
Pt states that she has had a headache for 2 days to the back of her head; pt states that she took [facility] without relief; pt denies blurry vision or visual changes; pt c/o lower back pain; pt denies injury to lower back; pt states that it is dull pain to both sides of her lower back; pt also c/o lower abd pain; pt c/o urinary frequency and urgency

## 2014-11-24 NOTE — ED Notes (Signed)
POC Preg POS.

## 2014-11-25 LAB — GC/CHLAMYDIA PROBE AMP (~~LOC~~) NOT AT ARMC
Chlamydia: NEGATIVE
Neisseria Gonorrhea: NEGATIVE

## 2014-11-25 NOTE — Discharge Instructions (Signed)
Tylenol for pain. Make sure your  partners treated as well. Follow up with OB/GYN.   Trichomoniasis Trichomoniasis is an infection caused by an organism called Trichomonas. The infection can affect both women and men. In women, the outer female genitalia and the vagina are affected. In men, the penis is mainly affected, but the prostate and other reproductive organs can also be involved. Trichomoniasis is a sexually transmitted infection (STI) and is most often passed to another person through sexual contact.  RISK FACTORS  Having unprotected sexual intercourse.  Having sexual intercourse with an infected partner. SIGNS AND SYMPTOMS  Symptoms of trichomoniasis in women include:  Abnormal gray-green frothy vaginal discharge.  Itching and irritation of the vagina.  Itching and irritation of the area outside the vagina. Symptoms of trichomoniasis in men include:   Penile discharge with or without pain.  Pain during urination. This results from inflammation of the urethra. DIAGNOSIS  Trichomoniasis may be found during a Pap test or physical exam. Your health care provider may use one of the following methods to help diagnose this infection:  Examining vaginal discharge under a microscope. For men, urethral discharge would be examined.  Testing the pH of the vagina with a test tape.  Using a vaginal swab test that checks for the Trichomonas organism. A test is available that provides results within a few minutes.  Doing a culture test for the organism. This is not usually needed. TREATMENT   You may be given medicine to fight the infection. Women should inform their health care provider if they could be or are pregnant. Some medicines used to treat the infection should not be taken during pregnancy.  Your health care provider may recommend over-the-counter medicines or creams to decrease itching or irritation.  Your sexual partner will need to be treated if infected. HOME CARE  INSTRUCTIONS   Take medicines only as directed by your health care provider.  Take over-the-counter medicine for itching or irritation as directed by your health care provider.  Do not have sexual intercourse while you have the infection.  Women should not douche or wear tampons while they have the infection.  Discuss your infection with your partner. Your partner may have gotten the infection from you, or you may have gotten it from your partner.  Have your sex partner get examined and treated if necessary.  Practice safe, informed, and protected sex.  See your health care provider for other STI testing. SEEK MEDICAL CARE IF:   You still have symptoms after you finish your medicine.  You develop abdominal pain.  You have pain when you urinate.  You have bleeding after sexual intercourse.  You develop a rash.  Your medicine makes you sick or makes you throw up (vomit). MAKE SURE YOU:  Understand these instructions.  Will watch your condition.  Will get help right away if you are not doing well or get worse. Document Released: 08/24/2000 Document Revised: 07/15/2013 Document Reviewed: 12/10/2012 [facility] Patient Information 2015 [place], [place]. This information is not intended to replace advice given to you by your health care provider. Make sure you discuss any questions you have with your health care provider.

## 2015-01-24 IMAGING — US US ABDOMEN COMPLETE
1 series · 14 of 25 positions shown · non-contrast
Comparison: None.

CLINICAL DATA: Upper abdominal pain.

EXAM:
ULTRASOUND ABDOMEN COMPLETE

[Series 1: us abdomen complete · 0.19mm/px · 14 of 76 slices shown]
[im 1/76]
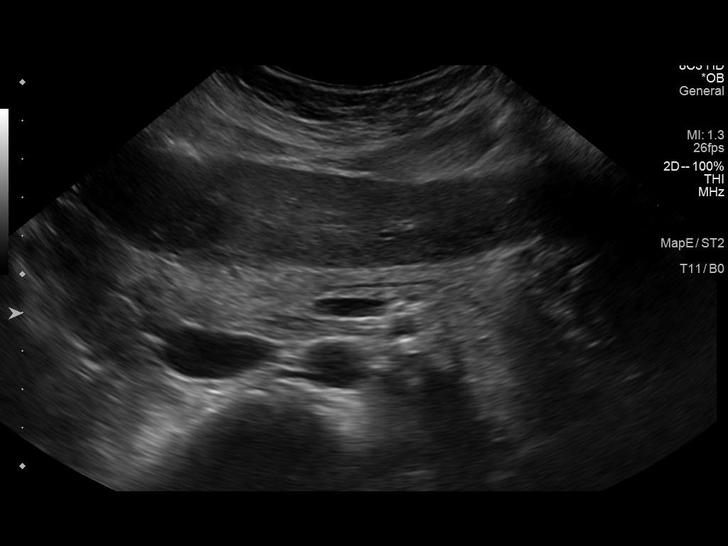
[im 7/76]
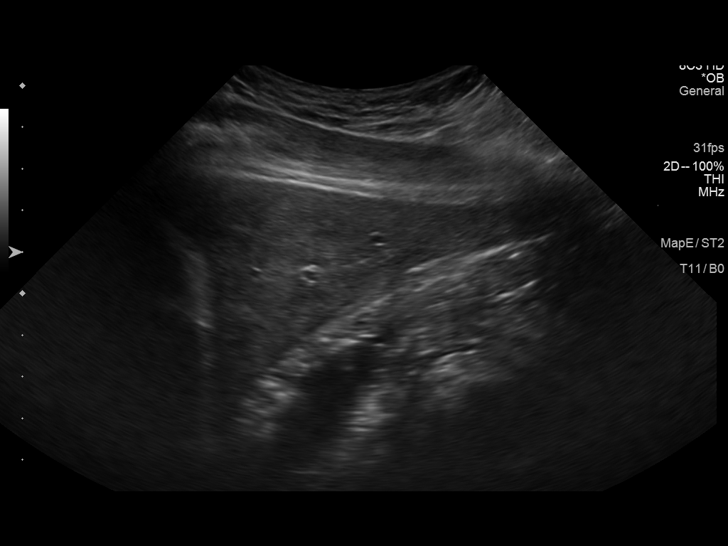
[im 13/76]
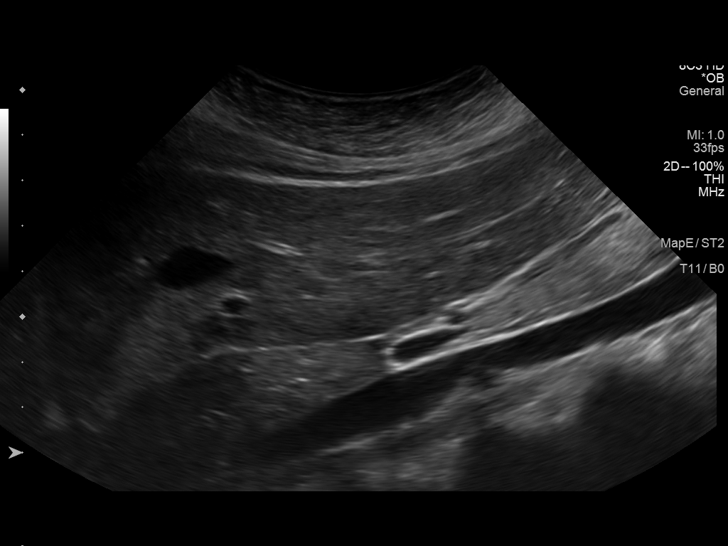
[im 19/76]
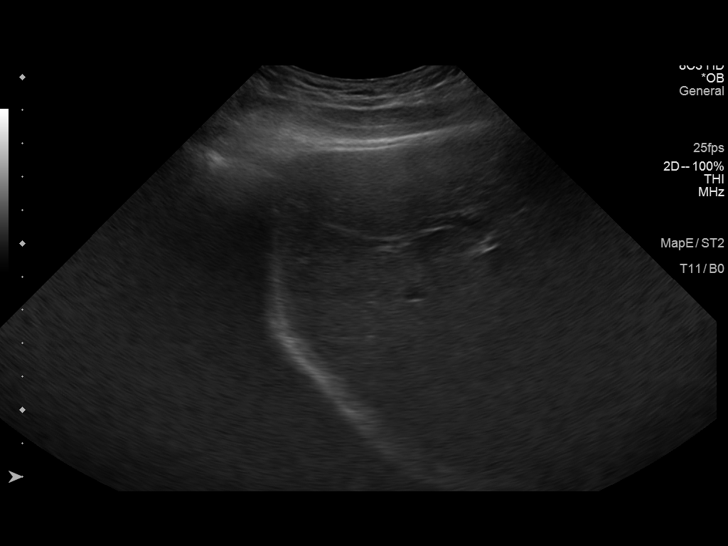
[im 26/76]
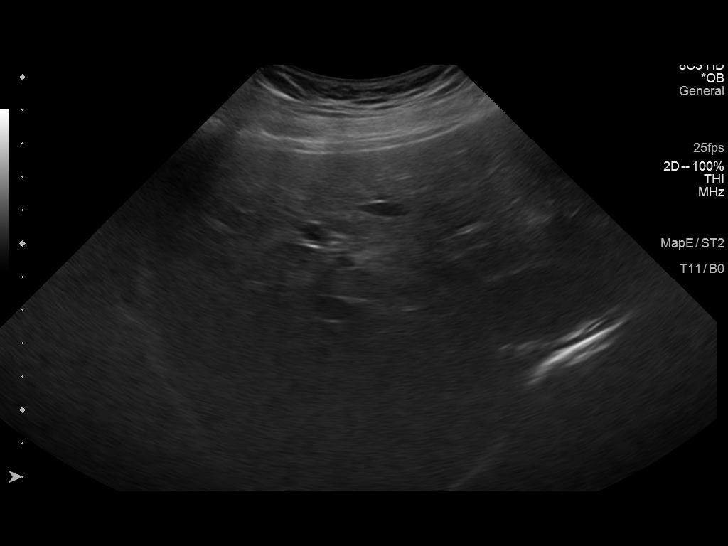
[im 29/76]
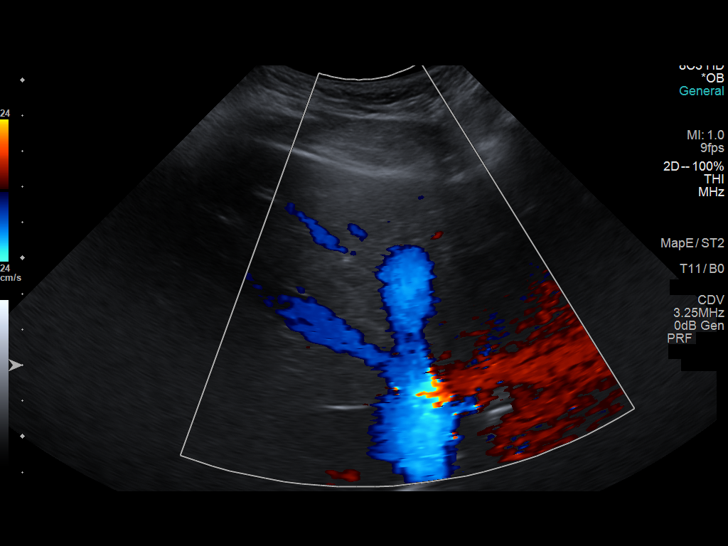
[im 35/76]
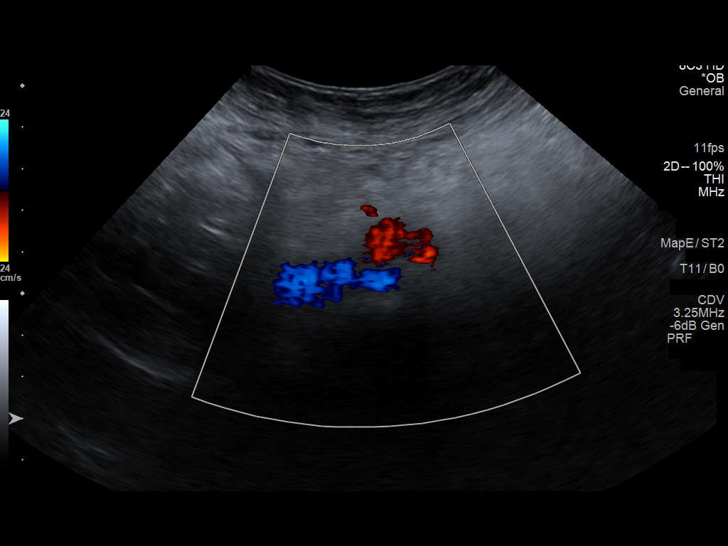
[im 41/76]
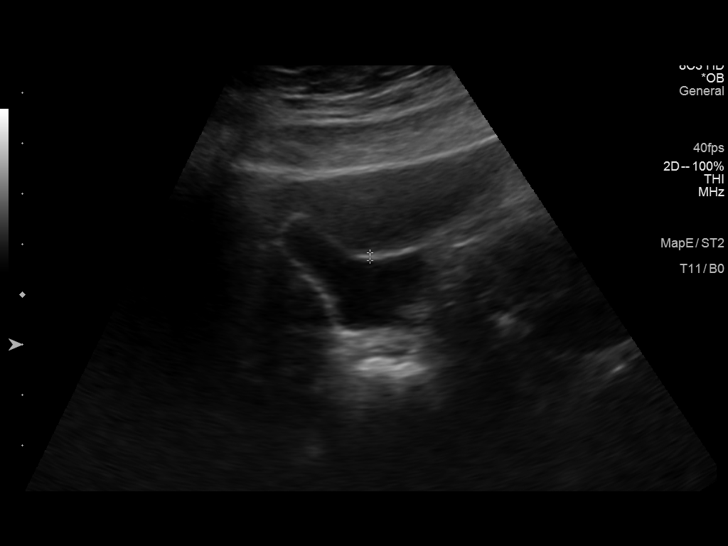
[im 47/76]
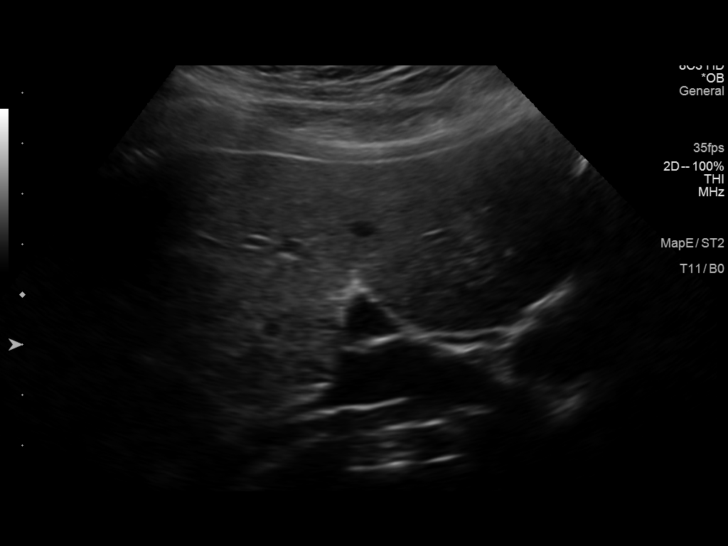
[im 51/76]
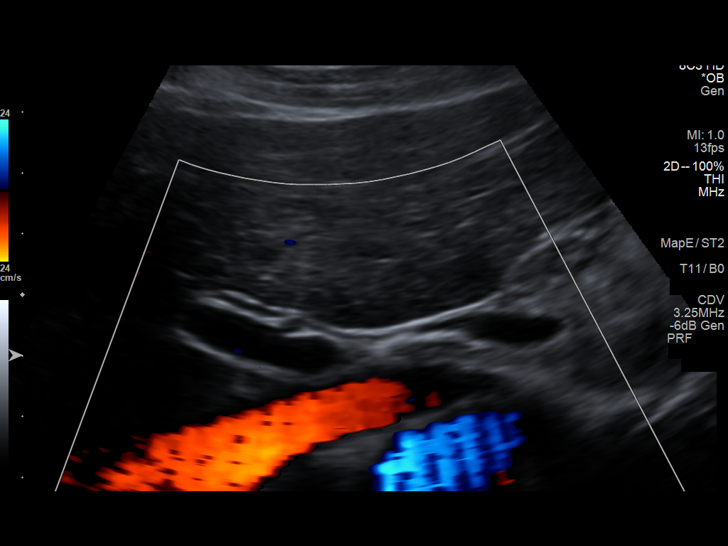
[im 57/76]
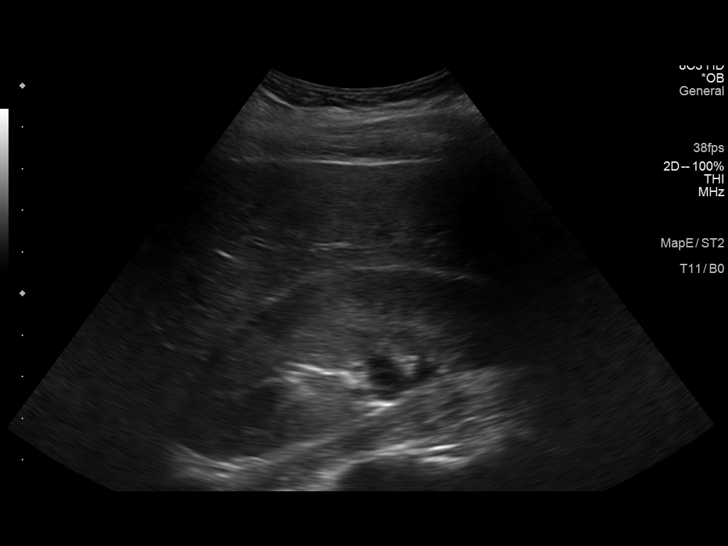
[im 63/76]
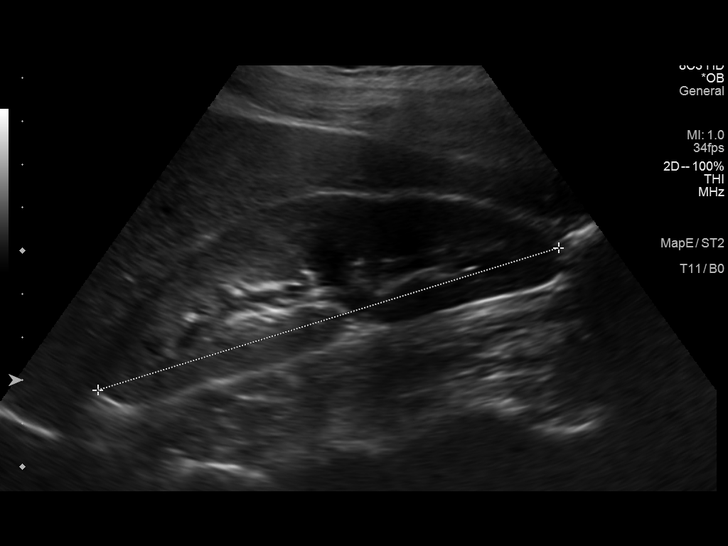
[im 69/76]
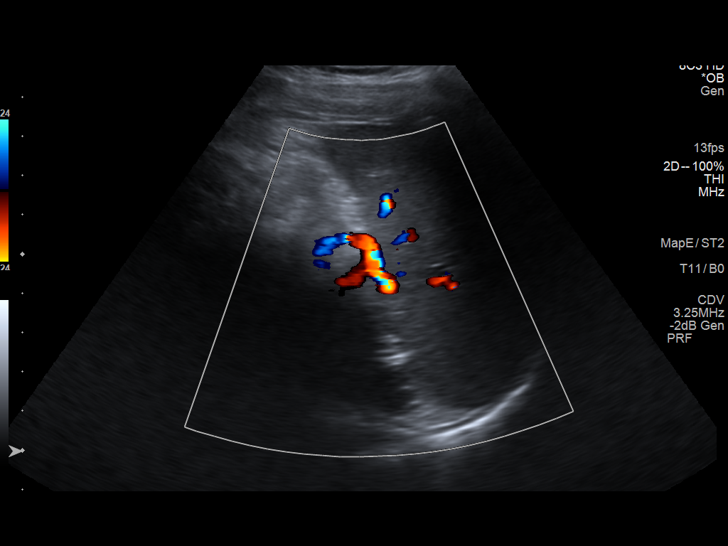
[im 76/76]
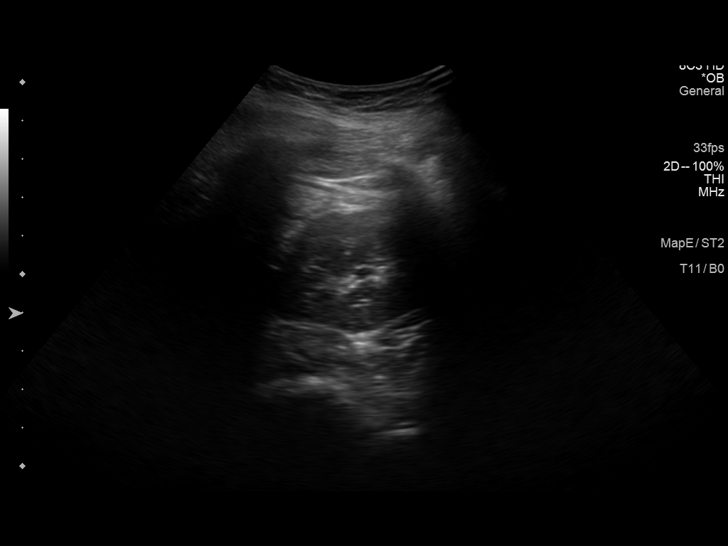

[14 of 25 positions shown; findings below may reference images not displayed]

FINDINGS: Gallbladder: No gallstones or wall thickening visualized. No
sonographic Murphy sign noted.

Common bile duct: Diameter: Up to 5 mm

Liver: There is a echogenic lesion within the central left liver
measuring 8 mm. No morphologic changes of cirrhosis. No additional
focal abnormality. Antegrade flow in the imaged hepatic and portal
venous system. No subcapsular fluid collection.

IVC: No abnormality visualized.

Pancreas: Visualized portion unremarkable.

Spleen: Size and appearance within normal limits.

Right Kidney: Length: 11 cm. Echogenicity within normal limits. No
mass or hydronephrosis visualized.

Left Kidney: Length: 11 cm. Echogenicity within normal limits. No
mass or hydronephrosis visualized.

Abdominal aorta: No aneurysm visualized.

Other findings: None.
IMPRESSION: 1. No acute findings to explain pain.
2. 8 mm lesion in the left liver, appearance most consistent with
hemangioma.

## 2015-01-28 LAB — PROCEDURE REPORT - SCANNED: PAP SMEAR: NEGATIVE

## 2015-01-30 ENCOUNTER — Other Ambulatory Visit (HOSPITAL_COMMUNITY): Payer: Self-pay | Admitting: Obstetrics

## 2015-01-30 DIAGNOSIS — IMO0002 Reserved for concepts with insufficient information to code with codable children: Secondary | ICD-10-CM

## 2015-01-30 DIAGNOSIS — O10912 Unspecified pre-existing hypertension complicating pregnancy, second trimester: Secondary | ICD-10-CM

## 2015-01-30 DIAGNOSIS — Z3A25 25 weeks gestation of pregnancy: Secondary | ICD-10-CM

## 2015-01-30 DIAGNOSIS — Z3689 Encounter for other specified antenatal screening: Secondary | ICD-10-CM

## 2015-02-17 ENCOUNTER — Encounter (HOSPITAL_COMMUNITY): Payer: Medicaid Other

## 2015-02-17 ENCOUNTER — Ambulatory Visit (HOSPITAL_COMMUNITY): Payer: Medicaid Other

## 2015-03-03 ENCOUNTER — Ambulatory Visit (HOSPITAL_COMMUNITY)
Admission: RE | Admit: 2015-03-03 | Discharge: 2015-03-03 | Disposition: A | Discharge status: Home / Self Care | Payer: Medicaid Other | Source: Ambulatory Visit | Attending: Obstetrics | Admitting: Obstetrics

## 2015-03-03 ENCOUNTER — Other Ambulatory Visit (HOSPITAL_COMMUNITY): Payer: Self-pay | Admitting: Obstetrics

## 2015-03-03 ENCOUNTER — Encounter (HOSPITAL_COMMUNITY): Payer: Self-pay

## 2015-03-03 VITALS — BP 105/58 | HR 91 | Wt 191.2 lb

## 2015-03-03 DIAGNOSIS — IMO0002 Reserved for concepts with insufficient information to code with codable children: Secondary | ICD-10-CM

## 2015-03-03 DIAGNOSIS — O09293 Supervision of pregnancy with other poor reproductive or obstetric history, third trimester: Secondary | ICD-10-CM

## 2015-03-03 DIAGNOSIS — Z3A32 32 weeks gestation of pregnancy: Secondary | ICD-10-CM

## 2015-03-03 DIAGNOSIS — O4290 Premature rupture of membranes, unspecified as to length of time between rupture and onset of labor, unspecified weeks of gestation: Secondary | ICD-10-CM

## 2015-03-03 DIAGNOSIS — Z3A25 25 weeks gestation of pregnancy: Secondary | ICD-10-CM

## 2015-03-03 DIAGNOSIS — O10912 Unspecified pre-existing hypertension complicating pregnancy, second trimester: Secondary | ICD-10-CM | POA: Insufficient documentation

## 2015-03-03 DIAGNOSIS — O26873 Cervical shortening, third trimester: Secondary | ICD-10-CM

## 2015-03-03 DIAGNOSIS — O09292 Supervision of pregnancy with other poor reproductive or obstetric history, second trimester: Secondary | ICD-10-CM

## 2015-03-03 DIAGNOSIS — Z3689 Encounter for other specified antenatal screening: Secondary | ICD-10-CM

## 2015-03-03 DIAGNOSIS — Z3A27 27 weeks gestation of pregnancy: Secondary | ICD-10-CM | POA: Insufficient documentation

## 2015-03-03 DIAGNOSIS — O10913 Unspecified pre-existing hypertension complicating pregnancy, third trimester: Secondary | ICD-10-CM

## 2015-03-03 DIAGNOSIS — O09212 Supervision of pregnancy with history of pre-term labor, second trimester: Secondary | ICD-10-CM

## 2015-03-03 NOTE — Progress Notes (Signed)
MFM Consult, Staff note:  Impressions: SIUP at [redacted]w[redacted]d, CHTN, hx IUFD, hx pPROM/PTD at 35 weeks EFW 37% no dysmorphic features no previa cervical length 2.2cm (short)  Discussion:  I saw Alicia Castro  in consultation. During our discussion, I reviewed hypertension as a cause of uteroplacental insufficiency, with increased risk of IUGR, oligohydramnios, and stillbirth. I told her that her hypertension also places her at increased risk for preeclampsia, describing the triad of increased blood pressure, proteinuria, and abnormal edema. Lastly, hypertension (severe range) increases the risk of placental abruption, especially in the setting of superimposed preeclampsia.  I reviewed the essential tenets in the most recent guidelines for management of hypertension in pregnancy in accordance with the American College of Obstetrics and Gynecology expert opinion. We talked about the medical treatment of hypertension in pregnancy. I outlined the different classes of medications, emphasizing that angiotensin enzyme inhibitors and angoitensin receptor blockers are contraindicated, and diuretics are relatively contraindicated. I told her that beta-blockers and calcium channel blockers are commonly used to treat hypertension in pregnancy, and that both are felt to be safe for use in pregnancy.    I outlined the usual plan of management for hypertension in pregnancy. She should have her blood pressure carefully followed, and her medications not needed so long as she remains within the target range of around 140-159/80-109 mm/Hg; ie, HTN should not be treated (no medication adjustment) until severe range blood pressures are noted owing to no known renal or cardiac disease in this patient and to be consistent with current guidelines.  I recommend a baseline 24 hour urine for creatinine clearance and total protein along with baseline CBC/LFT/BMP for comparison should clinical signs or symptoms of preeclampsia be  noted later in gestation.   Interval growth will be warranted monthly. Antenatal testing should begin at 32 weeks with twice weekly non-stress tests with weekly amniotic fluid volume measurement.   Given that her cervix is short, I agree with the prometrium  pv qhs until 36 weeks and would additionally recommend weekly Makena until 36 weeks given her history of prior preterm birth.  [facility] tracks this medication use for the indication of SPTB and she has no contraindication to starting (please do start the medication).  Recommendations: 1. interval growth monthly 2. antenatal testing at 32 weeks 3. Close surveillance of maternal BP with delivery at 38-39 weeks unless indication arises to warrant sooner (eg, preeclampsia, nonreassuring testing) 4. continue prometrium  pv qhs until 36 weeks 5. consider and recommend weekly Makena until 36 weeks (even though she presented late to care her hx of spontaneous PTB remains). 6. Given history of IUFD, I would recommend antiphospholipid antibody syndrome/APS work up if not already done (ie, these results are not available to me today and the patient is not aware of what workup was done). 7. Unfortunately the data on low dose aspirin would indicate that starting for preeclampsia/IUFD prophylaxis after 20 weeks is not effective.  While I will nonetheless offer it, I feel it is of little benefit with her very late presentation to care at almost 28 weeks/late trimester stage.  Time Spent: I spent in excess of 40 minutes in consultation with this patient to review records, evaluate her case, and provide her with an adequate discussion and education.  More than 50% of this time was spent in direct face-to-face counseling.  It was a pleasure seeing your patient in the office today.  Thank you for consultation. Please do not hesitate to contact our service  for any further questions.   Thank you,  Louann SjogrenJeffrey Morgan Gaynelle Arabianenney   Stellah Donovan, Louann SjogrenJeffrey Morgan, MD, MS,  FACOG Assistant Professor Section of Maternal-Fetal Medicine [facility]

## 2015-03-15 NOTE — L&D Delivery Note (Signed)
Delivery Note At 1:08 PM a viable female was delivered via  (Presentation: ;  ).  APGAR: , ; weight  .   Placenta status: Intact, Spontaneous.  Cord:  with the following complications: .  Cord pH: not done  Anesthesia:   Episiotomy: None Lacerations: None Suture Repair: 2.0 Est. Blood Loss (mL):    Mom to postpartum.  Baby to Couplet care / Skin to Skin.  Mercy Leppla A 04/16/2015, 1:16 PM

## 2015-03-25 ENCOUNTER — Encounter (HOSPITAL_COMMUNITY): Payer: Self-pay | Admitting: *Deleted

## 2015-03-25 ENCOUNTER — Inpatient Hospital Stay (HOSPITAL_COMMUNITY)
Admission: AD | Admit: 2015-03-25 | Discharge: 2015-03-25 | Disposition: A | Discharge status: Home / Self Care | Payer: Medicaid Other | Source: Ambulatory Visit | Attending: Obstetrics | Admitting: Obstetrics

## 2015-03-25 DIAGNOSIS — Z3A30 30 weeks gestation of pregnancy: Secondary | ICD-10-CM | POA: Diagnosis not present

## 2015-03-25 DIAGNOSIS — N898 Other specified noninflammatory disorders of vagina: Secondary | ICD-10-CM | POA: Insufficient documentation

## 2015-03-25 DIAGNOSIS — Z87440 Personal history of urinary (tract) infections: Secondary | ICD-10-CM | POA: Diagnosis not present

## 2015-03-25 DIAGNOSIS — F17210 Nicotine dependence, cigarettes, uncomplicated: Secondary | ICD-10-CM | POA: Insufficient documentation

## 2015-03-25 HISTORY — DX: Unspecified abnormal cytological findings in specimens from vagina: R87.629

## 2015-03-25 LAB — URINALYSIS, ROUTINE W REFLEX MICROSCOPIC
BILIRUBIN URINE: NEGATIVE
Glucose, UA: NEGATIVE mg/dL
Hgb urine dipstick: NEGATIVE
KETONES UR: 15 mg/dL — AB
Leukocytes, UA: NEGATIVE
NITRITE: NEGATIVE
PROTEIN: NEGATIVE mg/dL
SPECIFIC GRAVITY, URINE: 1.025 (ref 1.005–1.030)
pH: 7 (ref 5.0–8.0)

## 2015-03-25 MED ORDER — TERBUTALINE SULFATE 1 MG/ML IJ SOLN
0.2500 mg | Freq: Once | INTRAMUSCULAR | Status: AC
  Administered 2015-03-25: 0.25 mg via SUBCUTANEOUS
  Filled 2015-03-25: qty 1

## 2015-03-25 NOTE — MAU Provider Note (Signed)
Chief Complaint:  Vaginal Discharge and Contractions   First Provider Initiated Contact with Patient 03/25/15 1724      Alicia Castro is a 30 y.o. [id number] at 29w6dwho presents to maternity admissions reporting brown mucous discharge and irregular mild contractions.  Has been on Prometrium and 17P shots for a shortened cervix. . She reports good fetal movement, denies LOF, vaginal itching/burning, urinary symptoms, h/a, dizziness, n/v, diarrhea, constipation or fever/chills.  She denies headache, visual changes.   Abdominal Cramping This is a recurrent problem. The current episode started today. The onset quality is gradual. The problem occurs intermittently. The problem has been gradually improving. The pain is located in the suprapubic region. The pain is mild. The quality of the pain is cramping. The abdominal pain does not radiate. Pertinent negatives include no anorexia, constipation, diarrhea, dysuria, fever, headaches, myalgias, nausea or vomiting. Nothing aggravates the pain. The pain is relieved by nothing. She has tried nothing for the symptoms.   RN Note: Having discharge, kind of like end of period, noted this Morning a couple times. Started having contractions after that. Hx of PTL/PTD         Past Medical History: Past Medical History  Diagnosis Date  . UTI (lower urinary tract infection)     pt has h/o Acinetobacter in urine culture-requires Contact Isolation with each hodpitalization  . Chlamydia   . Gonorrhea   . Trichomonas   . Abnormal Pap smear   . Eclampsia   . Vaginal Pap smear, abnormal     Past obstetric history: OB History  Gravida Para Term Preterm AB SAB TAB Ectopic Multiple Living  6 3 2 1 2  2   2     # Outcome Date GA Lbr Len/2nd Weight Sex Delivery Anes PTL Lv  6 Current           5 Preterm 03/31/13 [redacted]w[redacted]d 22:46 / 00:35 2.45 kg (5 lb 6.4 oz) F Vag-Spont EPI  Y  4 Term 2008        FD  3 Term 2005        Y  2 TAB           1 TAB                Past Surgical History: Past Surgical History  Procedure Laterality Date  . Wisdom tooth extraction    . Colposcopy      Family History: Family History  Problem Relation Age of Onset  . Cancer Maternal Grandmother     Social History: Social History  Substance Use Topics  . Smoking status: Current Every Day Smoker -- 0.50 packs/day    Types: Cigarettes  . Smokeless tobacco: Never Used  . Alcohol Use: Yes     Comment: smoked yesterday, daily    Allergies: No Known Allergies  Meds:  Prescriptions prior to admission  Medication Sig Dispense Refill Last Dose  . butalbital-acetaminophen-caffeine (FIORICET, ESGIC) 50-325-40 MG tablet Take 1 tablet by mouth every 4 (four) hours as needed for headache.   0 03/24/2015 at Unknown time  . Prenat w/o A-FeCbGl-DSS-FA-DHA (CITRANATAL 90 DHA) 90-1 & 300 MG MISC Take 1 capsule by mouth daily.  6 03/25/2015 at Unknown time  . traMADol (ULTRAM) 50 MG tablet Take 1-2 tablets (50-100 mg total) by mouth every 6 (six) hours as needed for severe pain. (Patient not taking: Reported on 05/22/2014) 30 tablet 0 Not Taking   I have reviewed patient's Past Medical Hx, Surgical Hx, Family Hx,  Social Hx, medications and allergies.   ROS:  Review of Systems  Constitutional: Negative for fever and chills.  Gastrointestinal: Positive for abdominal pain. Negative for nausea, vomiting, diarrhea, constipation and anorexia.  Genitourinary: Positive for vaginal bleeding (brown old blood), vaginal discharge and pelvic pain. Negative for dysuria.  Musculoskeletal: Negative for myalgias.  Neurological: Negative for headaches.  Other systems negative  Physical Exam  Patient Vitals for the past 24 hrs:  BP Temp Temp src Pulse Resp  03/25/15 1641 104/55 mmHg 98.2 F (36.8 C) Oral 87 18   Constitutional: Well-developed, well-nourished female in no acute distress.  Cardiovascular: normal rate and rhythm Respiratory: normal effort, clear to auscultate  bilaterally GI: Abd soft, non-tender, gravid appropriate for gestational age.   No rebound or guarding. MS: Extremities nontender, no edema, normal ROM Neurologic: Alert and oriented x 4.  GU: Neg CVAT.  PELVIC EXAM: Cervix pink, visually closed, without lesion, small amount of brown mucous discharge, vaginal walls and external genitalia normal Dilation: 1 Effacement (%): 50 Cervical Position: Posterior Station: Ballotable Presentation: Undeterminable Exam by:: Mayford KnifeWilliams CNM  FHT:  Baseline 140 , moderate variability, accelerations present, no decelerations Contractions:  Irregular and mild   Labs: No results found for this or any previous visit (from the past 24 hour(s)). --/--/O POS (07/23 [id number])  Imaging:  [place] [facility] Ob Transvaginal  03/03/2015  OBSTETRICAL ULTRASOUND: This exam was performed within a [facility]. The OB US report was generated in the AS system, and faxed to the ordering physician.  This report is available in the [employer] PACS. See the AS Obstetric US report via the Image Link.  [place] [facility] Ob Detail +14 Wk  03/03/2015  OBSTETRICAL ULTRASOUND: This exam was performed within a [place] [facility] Department. The OB US report was generated in the AS system, and faxed to the ordering physician.  This report is available in the [employer] PACS. See the AS Obstetric US report via the Image Link.   MAU Course/MDM: NST reviewed Consult Dr Gaynell FaceMarshall with presentation, exam findings.  Treatments in MAU included Terbutaline per order Dr Gaynell FaceMarshall. WIll not give Betamethasone at this time.   Cramps improved with Terbutaline. Now only 3-4 per hour, mild. Patient states they don't hurt much Pt stable at time of discharge.   Assessment: SIUP at 5646w6d  Preterm cramping Preexisting cervical effacement, no further dilation  Plan: Discharge home Preterm Labor precautions and fetal kick counts Return to MAU if > 5-6 contractions per hour Follow up in Dr  Elsie StainMarshall's Office for prenatal visits and recheck of cervix    Medication List    ASK your doctor about these medications        butalbital-acetaminophen-caffeine 50-325-40 MG tablet  Commonly known as:  FIORICET, ESGIC  Take 1 tablet by mouth every 4 (four) hours as needed for headache.     CITRANATAL 90 DHA 90-1 & 300 MG Misc  Take 1 capsule by mouth daily.     traMADol 50 MG tablet  Commonly known as:  ULTRAM  Take 1-2 tablets (50-100 mg total) by mouth every 6 (six) hours as needed for severe pain.        Wynelle BourgeoisMarie Chamar Broughton CNM, MSN Certified Nurse-Midwife 03/25/2015 5:46 PM

## 2015-03-25 NOTE — MAU Note (Signed)
Having discharge, kind of like end of period, noted this  Morning a couple times.  Started having contractions after that.  Hx of PTL/PTD

## 2015-03-25 NOTE — Discharge Instructions (Signed)
Preterm Labor Information °Preterm labor is when labor starts at less than 37 weeks of pregnancy. The normal length of a pregnancy is 39 to 41 weeks. °CAUSES °Often, there is no identifiable underlying cause as to why a woman goes into preterm labor. One of the most common known causes of preterm labor is infection. Infections of the uterus, cervix, vagina, amniotic sac, bladder, kidney, or even the lungs (pneumonia) can cause labor to start. Other suspected causes of preterm labor include:  °· Urogenital infections, such as yeast infections and bacterial vaginosis.   °· Uterine abnormalities (uterine shape, uterine septum, fibroids, or bleeding from the placenta).   °· A cervix that has been operated on (it may fail to stay closed).   °· Malformations in the fetus.   °· Multiple gestations (twins, triplets, and so on).   °· Breakage of the amniotic sac.   °RISK FACTORS °· Having a previous history of preterm labor.   °· Having premature rupture of membranes (PROM).   °· Having a placenta that covers the opening of the cervix (placenta previa).   °· Having a placenta that separates from the uterus (placental abruption).   °· Having a cervix that is too weak to hold the fetus in the uterus (incompetent cervix).   °· Having too much fluid in the amniotic sac (polyhydramnios).   °· Taking illegal drugs or smoking while pregnant.   °· Not gaining enough weight while pregnant.   °· Being younger than 18 and older than 30 years old.   °· Having a low socioeconomic status.   °· Being African American. °SYMPTOMS °Signs and symptoms of preterm labor include:  °· Menstrual-like cramps, abdominal pain, or back pain. °· Uterine contractions that are regular, as frequent as six in an hour, regardless of their intensity (may be mild or painful). °· Contractions that start on the top of the uterus and spread down to the lower abdomen and back.   °· A sense of increased pelvic pressure.   °· A watery or bloody mucus discharge that  comes from the vagina.   °TREATMENT °Depending on the length of the pregnancy and other circumstances, your health care provider may suggest bed rest. If necessary, there are medicines that can be given to stop contractions and to mature the fetal lungs. If labor happens before 34 weeks of pregnancy, a prolonged hospital stay may be recommended. Treatment depends on the condition of both you and the fetus.  °WHAT SHOULD YOU DO IF YOU THINK YOU ARE IN PRETERM LABOR? °Call your health care provider right away. You will need to go to the hospital to get checked immediately. °HOW CAN YOU PREVENT PRETERM LABOR IN FUTURE PREGNANCIES? °You should:  °· Stop smoking if you smoke.  °· Maintain healthy weight gain and avoid chemicals and drugs that are not necessary. °· Be watchful for any type of infection. °· Inform your health care provider if you have a known history of preterm labor. °  °This information is not intended to replace advice given to you by your health care provider. Make sure you discuss any questions you have with your health care provider. °  °Document Released: 05/21/2003 Document Revised: 10/31/2012 Document Reviewed: 04/02/2012 °Elsevier Interactive Patient Education ©2016 [employer] Inc[employer] ° °Pelvic Rest °Pelvic rest is sometimes recommended for women when:  °· The placenta is partially or completely covering the opening of the cervix (placenta previa). °· There is bleeding between the uterine wall and the amniotic sac in the first trimester (subchorionic hemorrhage). °· The cervix begins to open without labor starting (incompetent cervix, cervical insufficiency). °· The   labor is too early (preterm labor). °HOME CARE INSTRUCTIONS °· Do not have sexual intercourse, stimulation, or an orgasm. °· Do not use tampons, douche, or put anything in the vagina. °· Do not lift anything over 10 pounds (4.5 kg). °· Avoid strenuous activity or straining your pelvic muscles. °SEEK MEDICAL CARE IF:  °· You have any  vaginal bleeding during pregnancy. Treat this as a potential emergency. °· You have cramping pain felt low in the stomach (stronger than menstrual cramps). °· You notice vaginal discharge (watery, mucus, or bloody). °· You have a low, dull backache. °· There are regular contractions or uterine tightening. °SEEK IMMEDIATE MEDICAL CARE IF: °You have vaginal bleeding and have placenta previa.  °  °This information is not intended to replace advice given to you by your health care provider. Make sure you discuss any questions you have with your health care provider. °  °Document Released: 06/25/2010 Document Revised: 05/23/2011 Document Reviewed: 09/01/2014 °Elsevier Interactive Patient Education ©2016 Elsevier Inc. ° °

## 2015-03-27 ENCOUNTER — Inpatient Hospital Stay (HOSPITAL_COMMUNITY): Payer: Medicaid Other

## 2015-03-27 ENCOUNTER — Inpatient Hospital Stay (HOSPITAL_COMMUNITY)
Admission: AD | Admit: 2015-03-27 | Discharge: 2015-03-29 | DRG: 782 | Disposition: A | Discharge status: Home / Self Care | Payer: Medicaid Other | Source: Ambulatory Visit | Attending: Obstetrics | Admitting: Obstetrics

## 2015-03-27 ENCOUNTER — Encounter (HOSPITAL_COMMUNITY): Payer: Self-pay | Admitting: *Deleted

## 2015-03-27 DIAGNOSIS — R109 Unspecified abdominal pain: Secondary | ICD-10-CM | POA: Diagnosis present

## 2015-03-27 DIAGNOSIS — Z3A31 31 weeks gestation of pregnancy: Secondary | ICD-10-CM

## 2015-03-27 DIAGNOSIS — O99333 Smoking (tobacco) complicating pregnancy, third trimester: Secondary | ICD-10-CM | POA: Diagnosis present

## 2015-03-27 DIAGNOSIS — F17210 Nicotine dependence, cigarettes, uncomplicated: Secondary | ICD-10-CM | POA: Diagnosis present

## 2015-03-27 DIAGNOSIS — O4693 Antepartum hemorrhage, unspecified, third trimester: Secondary | ICD-10-CM | POA: Diagnosis not present

## 2015-03-27 DIAGNOSIS — R58 Hemorrhage, not elsewhere classified: Secondary | ICD-10-CM

## 2015-03-27 LAB — URINALYSIS, ROUTINE W REFLEX MICROSCOPIC
BILIRUBIN URINE: NEGATIVE
GLUCOSE, UA: NEGATIVE mg/dL
KETONES UR: NEGATIVE mg/dL
NITRITE: NEGATIVE
PH: 7 (ref 5.0–8.0)
PROTEIN: 30 mg/dL — AB
Specific Gravity, Urine: 1.02 (ref 1.005–1.030)

## 2015-03-27 LAB — URINE MICROSCOPIC-ADD ON

## 2015-03-27 LAB — TYPE AND SCREEN
ABO/RH(D): O POS
ANTIBODY SCREEN: NEGATIVE

## 2015-03-27 LAB — WET PREP, GENITAL
CLUE CELLS WET PREP: NONE SEEN
Sperm: NONE SEEN
TRICH WET PREP: NONE SEEN
Yeast Wet Prep HPF POC: NONE SEEN

## 2015-03-27 LAB — RAPID URINE DRUG SCREEN, HOSP PERFORMED
AMPHETAMINES: NOT DETECTED
Barbiturates: POSITIVE — AB
Benzodiazepines: NOT DETECTED
Cocaine: NOT DETECTED
OPIATES: NOT DETECTED
TETRAHYDROCANNABINOL: POSITIVE — AB

## 2015-03-27 LAB — OB RESULTS CONSOLE GC/CHLAMYDIA: Gonorrhea: NEGATIVE

## 2015-03-27 MED ORDER — LACTATED RINGERS IV SOLN
INTRAVENOUS | Status: DC
  Administered 2015-03-28 – 2015-03-29 (×2): via INTRAVENOUS

## 2015-03-27 MED ORDER — PRENATAL MULTIVITAMIN CH
1.0000 | ORAL_TABLET | Freq: Every day | ORAL | Status: DC
  Administered 2015-03-28: 1 via ORAL
  Filled 2015-03-27: qty 1

## 2015-03-27 MED ORDER — MAGNESIUM SULFATE 50 % IJ SOLN
2.0000 g/h | INTRAVENOUS | Status: DC
  Administered 2015-03-27: 2 g/h via INTRAVENOUS
  Filled 2015-03-27: qty 80

## 2015-03-27 MED ORDER — LACTATED RINGERS IV BOLUS (SEPSIS)
1000.0000 mL | Freq: Once | INTRAVENOUS | Status: AC
  Administered 2015-03-27: 1000 mL via INTRAVENOUS

## 2015-03-27 MED ORDER — BETAMETHASONE SOD PHOS & ACET 6 (3-3) MG/ML IJ SUSP
12.0000 mg | INTRAMUSCULAR | Status: AC
  Administered 2015-03-27 – 2015-03-28 (×2): 12 mg via INTRAMUSCULAR
  Filled 2015-03-27 (×2): qty 2

## 2015-03-27 MED ORDER — TERBUTALINE SULFATE 1 MG/ML IJ SOLN
0.2500 mg | Freq: Once | INTRAMUSCULAR | Status: AC
  Administered 2015-03-27: 0.25 mg via SUBCUTANEOUS
  Filled 2015-03-27: qty 1

## 2015-03-27 MED ORDER — MAGNESIUM SULFATE BOLUS VIA INFUSION
4.0000 g | Freq: Once | INTRAVENOUS | Status: AC
  Administered 2015-03-27: 4 g via INTRAVENOUS
  Filled 2015-03-27: qty 500

## 2015-03-27 MED ORDER — ZOLPIDEM TARTRATE 5 MG PO TABS: 5.0000 mg | ORAL_TABLET | Freq: Every evening | ORAL | Status: DC | PRN

## 2015-03-27 MED ORDER — DOCUSATE SODIUM 100 MG PO CAPS
100.0000 mg | ORAL_CAPSULE | Freq: Every day | ORAL | Status: DC
  Administered 2015-03-28: 100 mg via ORAL
  Filled 2015-03-27: qty 1

## 2015-03-27 MED ORDER — ACETAMINOPHEN 325 MG PO TABS: 650.0000 mg | ORAL_TABLET | ORAL | Status: DC | PRN

## 2015-03-27 MED ORDER — CALCIUM CARBONATE ANTACID 500 MG PO CHEW: 2.0000 | CHEWABLE_TABLET | ORAL | Status: DC | PRN

## 2015-03-27 NOTE — MAU Provider Note (Signed)
Chief Complaint:  Vaginal Bleeding and Contractions   First Provider Initiated Contact with Patient 03/27/15 1837      HPI: Alicia Castro is a 30 y.o. [id number] at 867w1d who presents to maternity admissions reporting dark red vaginal bleeding with clots x 1 episode at 3 pm today that slowed to light bleeding now, then cramping/contractions starting after the bleeding, worsening since onset.  Nothing makes the pain better or worse. She is followed in this pregnancy for history of preterm birth and shortened cervix and is on Prometrium and 17-P.   She reports good fetal movement, denies LOF, vaginal itching/burning, urinary symptoms, h/a, dizziness, n/v, or fever/chills.      HPI  Past Medical History: Past Medical History  Diagnosis Date  . UTI (lower urinary tract infection)     pt has h/o Acinetobacter in urine culture-requires Contact Isolation with each hodpitalization  . Chlamydia   . Gonorrhea   . Trichomonas   . Abnormal Pap smear   . Eclampsia   . Vaginal Pap smear, abnormal   . Preterm labor     Past obstetric history: OB History  Gravida Para Term Preterm AB SAB TAB Ectopic Multiple Living  6 3 2 1 2  2   2     # Outcome Date GA Lbr Len/2nd Weight Sex Delivery Anes PTL Lv  6 Current           5 Preterm 03/31/13 [id number] 22:46 / 00:35 5 lb 6.4 oz (2.45 kg) F Vag-Spont EPI  Y  4 Term 2008        FD  3 Term 2005        Y  2 TAB           1 TAB               Past Surgical History: Past Surgical History  Procedure Laterality Date  . Wisdom tooth extraction    . Colposcopy      Family History: Family History  Problem Relation Age of Onset  . Cancer Maternal Grandmother     Social History: Social History  Substance Use Topics  . Smoking status: Current Every Day Smoker -- 0.50 packs/day    Types: Cigarettes  . Smokeless tobacco: Never Used  . Alcohol Use: No     Comment: smoked yesterday, daily    Allergies: No Known Allergies  Meds:   Prescriptions prior to admission  Medication Sig Dispense Refill Last Dose  . butalbital-acetaminophen-caffeine (FIORICET, ESGIC) 50-325-40 MG tablet Take 1 tablet by mouth every 4 (four) hours as needed for headache.   0 Past Week at Unknown time  . Prenat w/o A-[id number]-[id number]-FA-DHA (CITRANATAL 90 DHA) 90-1 & 300 MG MISC Take 1 capsule by mouth daily.  6 03/27/2015 at Unknown time    ROS:  Review of Systems  Constitutional: Negative for fever, chills and fatigue.  Respiratory: Negative for shortness of breath.   Cardiovascular: Negative for chest pain.  Gastrointestinal: Positive for abdominal pain.  Genitourinary: Positive for vaginal bleeding and pelvic pain. Negative for dysuria, flank pain, vaginal discharge, difficulty urinating and vaginal pain.  Neurological: Negative for dizziness and headaches.  Psychiatric/Behavioral: Negative.      I have reviewed patient's Past Medical Hx, Surgical Hx, Family Hx, Social Hx, medications and allergies.   Physical Exam   Patient Vitals for the past 24 hrs:  BP Temp Temp src Pulse Resp  03/27/15 2031 (!) 104/51 mmHg - - 96 16  03/27/15 1814 103/57  mmHg 98.2 F (36.8 C) Oral 109 18   Constitutional: Well-developed, well-nourished female in no acute distress.  Cardiovascular: normal rate Respiratory: normal effort GI: Abd soft, non-tender, gravid appropriate for gestational age.  MS: Extremities nontender, no edema, normal ROM Neurologic: Alert and oriented x 4.  GU: Neg CVAT.  PELVIC EXAM: Cervix pink, visually closed, without lesion, small amount dark red bleeding noted, vaginal walls and external genitalia normal  Dilation: 1 Effacement (%): 50 Station: -3 Presentation: Vertex Exam by:: Sharen Counter, CNM  FHT:  Baseline 145, moderate variability, accelerations present, no decelerations Contractions: irritability   Labs: Results for orders placed or performed during the hospital encounter of 03/27/15 (from the past 24  hour(s))  Urinalysis, Routine w reflex microscopic (not at [facility])     Status: Abnormal   Collection Time: 03/27/15  6:00 PM  Result Value Ref Range   Color, Urine YELLOW YELLOW   APPearance CLEAR CLEAR   Specific Gravity, Urine 1.020 1.005 - 1.030   pH 7.0 5.0 - 8.0   Glucose, UA NEGATIVE NEGATIVE mg/dL   Hgb urine dipstick LARGE (A) NEGATIVE   Bilirubin Urine NEGATIVE NEGATIVE   Ketones, ur NEGATIVE NEGATIVE mg/dL   Protein, ur 30 (A) NEGATIVE mg/dL   Nitrite NEGATIVE NEGATIVE   Leukocytes, UA SMALL (A) NEGATIVE  Urine microscopic-add on     Status: Abnormal   Collection Time: 03/27/15  6:00 PM  Result Value Ref Range   Squamous Epithelial / LPF 0-5 (A) NONE SEEN   WBC, UA 0-5 0 - 5 WBC/hpf   RBC / HPF 0-5 0 - 5 RBC/hpf   Bacteria, UA FEW (A) NONE SEEN  Wet prep, genital     Status: Abnormal   Collection Time: 03/27/15  6:40 PM  Result Value Ref Range   Yeast Wet Prep HPF POC NONE SEEN NONE SEEN   Trich, Wet Prep NONE SEEN NONE SEEN   Clue Cells Wet Prep HPF POC NONE SEEN NONE SEEN   WBC, Wet Prep HPF POC MANY (A) NONE SEEN   Sperm NONE SEEN    --/--/O POS (07/23 1610)  Imaging:  Limited OB with cervix 2 cm in length, normal AFI, FHR, and placenta  MAU Course/MDM: I have ordered labs and reviewed results.  Consult Dr Gaynell Face.  IV hydration and terbutaline SQ.  Went in room to discuss [place] results and medication/fluids with pt and pt sleeping and difficult to arouse.  Pt falling asleep while provider discussing treatment.  Pt in and out of sleep while IV started and medication given by RN.  UDS ordered.  Positive for barbituates but pt prescribed Fioricet.  Pt continued to report light bleeding when in the bathroom.  No bleeding on pad in room.  Called Dr Gaynell Face, admit to antepartum for observation, betamethasone, magnesium sulfate, and IV fluids.  Pt stable at time of transfer.  Assessment: 1. Vaginal bleeding in pregnancy, third trimester     Plan: Admit to  antepartum IV fluids BMZ x  2 in 24 hours Magnesium sulfate 4g then 2g/hour Continuous EFM      Medication List    ASK your doctor about these medications        butalbital-acetaminophen-caffeine 50-325-40 MG tablet  Commonly known as:  FIORICET, ESGIC  Take 1 tablet by mouth every 4 (four) hours as needed for headache.     CITRANATAL 90 DHA 90-1 & 300 MG Misc  Take 1 capsule by mouth daily.  Sharen Counter Certified Nurse-Midwife 03/27/2015 8:48 PM

## 2015-03-27 NOTE — MAU Note (Signed)
Pt states that she has been having vaginal bleeding that started around 1500.  Pt states that she has needed to wear a pad and it was like a period but now it has stopped.  Pt states that the contractions started around 1600 and are still happening.  Pt states that she had two or three that were 3 minutes apart but the last one she had was about 5 or 10 minutes ago so overall the contractions are irregular.

## 2015-03-27 NOTE — MAU Note (Signed)
Urine sent to lab 

## 2015-03-28 DIAGNOSIS — R58 Hemorrhage, not elsewhere classified: Secondary | ICD-10-CM

## 2015-03-28 LAB — CBC
HEMATOCRIT: 27.1 % — AB (ref 36.0–46.0)
Hemoglobin: 9 g/dL — ABNORMAL LOW (ref 12.0–15.0)
MCH: 31.6 pg (ref 26.0–34.0)
MCHC: 33.2 g/dL (ref 30.0–36.0)
MCV: 95.1 fL (ref 78.0–100.0)
PLATELETS: 201 10*3/uL (ref 150–400)
RBC: 2.85 MIL/uL — ABNORMAL LOW (ref 3.87–5.11)
RDW: 13.9 % (ref 11.5–15.5)
WBC: 7.6 10*3/uL (ref 4.0–10.5)

## 2015-03-28 LAB — RPR: RPR Ser Ql: NONREACTIVE

## 2015-03-28 MED ORDER — BUTALBITAL-APAP-CAFFEINE 50-325-40 MG PO TABS
1.0000 | ORAL_TABLET | ORAL | Status: DC | PRN
  Administered 2015-03-28: 1 via ORAL
  Filled 2015-03-28: qty 1

## 2015-03-28 NOTE — H&P (Signed)
This is Dr. Francoise CeoBernard Joud Ingwersen dictating the history and physical on Alicia Castro  she's a 30 year old gravida 6 para 2122 at 31 weeks and [facility] the patient came to the emergency room because she was having some cramping and noted some bright red bleeding she had previously had an episode of some got red bleeding ultrasound was normal normal fluid no signs of abruption the patient received hydration and her contractions decreased and she was placed on magnesium sulfate 4 g loading 2 g an hour and admitted and received betamethasone 12 mg another dose due in 24 hours she is also on continuous monitoring and she has good fetal movement Past medical history negative Past surgical history negative Social history negative System review negative Physical exam well-developed female in no distress at 30 weeks Breasts negative Heart regular rhythm no murmurs no gallops Lungs clear to P&A Abdomen 30 week size Pelvic as described above Extremities negative

## 2015-03-28 NOTE — Progress Notes (Signed)
Patient ID: Alicia Castro, female   DOB: 1986/02/13, 30 y.o.   MRN: [id number] Vital signs normal No bleeding noted this morning she is for another dose of betamethasone this p.m. Will discontinue her magnesium sulfate

## 2015-03-29 NOTE — Discharge Instructions (Signed)
Discharge instructions   You can wash your hair  Shower  Eat what you want  Drink what you want  See me in 6 weeks  Your ankles are going to swell more in the next 2 weeks than when pregnant  No sex for 6 weeks   Farrel Guimond A, MD 03/29/2015

## 2015-03-29 NOTE — Progress Notes (Signed)
Patient provided discharge instructions. IV removed and intact. BP taken patient stable on discharge. Advised when to contact physician and provided follow up appointments

## 2015-03-29 NOTE — Discharge Summary (Signed)
  Patient is [redacted] weeks pregnant and 3 days [facility] 05/28/2015 she's a gravida 6 para 2122 and she was admitted because she had some bright red vaginal bleeding and some cramping she received magnesium sulfate the cramping and that discontinued the mag was stopped yesterday and she's had no more bleeding she'll be discharged today on bedrest to see me in a week

## 2015-03-29 NOTE — Progress Notes (Signed)
Patient ID: Alicia Castro, female   DOB: [birth year], 30 y.o.   MRN: [id number] Blood pressure 92/50 respiration 18 pulse 91 afebrile Patient was admitted because she had some bleeding and some cramping she received magnesium sulfate which was continued discontinued yesterday and she received her second dose of betamethasone last night she's not having any contractions cramping her bleeding at this time of the discharge today

## 2015-03-30 LAB — GC/CHLAMYDIA PROBE AMP (~~LOC~~) NOT AT ARMC
CHLAMYDIA, DNA PROBE: NEGATIVE
NEISSERIA GONORRHEA: NEGATIVE

## 2015-04-01 ENCOUNTER — Ambulatory Visit (HOSPITAL_COMMUNITY): Payer: Medicaid Other | Attending: Obstetrics

## 2015-04-07 ENCOUNTER — Inpatient Hospital Stay (HOSPITAL_COMMUNITY)
Admission: AD | Admit: 2015-04-07 | Discharge: 2015-04-18 | DRG: 775 | Disposition: A | Discharge status: Home / Self Care | Payer: Medicaid Other | Source: Ambulatory Visit | Attending: Obstetrics | Admitting: Obstetrics

## 2015-04-07 ENCOUNTER — Encounter (HOSPITAL_COMMUNITY): Payer: Self-pay

## 2015-04-07 ENCOUNTER — Inpatient Hospital Stay (HOSPITAL_COMMUNITY): Payer: Medicaid Other

## 2015-04-07 DIAGNOSIS — O10019 Pre-existing essential hypertension complicating pregnancy, unspecified trimester: Secondary | ICD-10-CM

## 2015-04-07 DIAGNOSIS — O42913 Preterm premature rupture of membranes, unspecified as to length of time between rupture and onset of labor, third trimester: Principal | ICD-10-CM | POA: Diagnosis present

## 2015-04-07 DIAGNOSIS — O42919 Preterm premature rupture of membranes, unspecified as to length of time between rupture and onset of labor, unspecified trimester: Secondary | ICD-10-CM

## 2015-04-07 DIAGNOSIS — Z3490 Encounter for supervision of normal pregnancy, unspecified, unspecified trimester: Secondary | ICD-10-CM

## 2015-04-07 DIAGNOSIS — Z3A32 32 weeks gestation of pregnancy: Secondary | ICD-10-CM

## 2015-04-07 DIAGNOSIS — O26873 Cervical shortening, third trimester: Secondary | ICD-10-CM

## 2015-04-07 DIAGNOSIS — O4290 Premature rupture of membranes, unspecified as to length of time between rupture and onset of labor, unspecified weeks of gestation: Secondary | ICD-10-CM | POA: Diagnosis present

## 2015-04-07 DIAGNOSIS — O09293 Supervision of pregnancy with other poor reproductive or obstetric history, third trimester: Secondary | ICD-10-CM

## 2015-04-07 DIAGNOSIS — Z3A33 33 weeks gestation of pregnancy: Secondary | ICD-10-CM

## 2015-04-07 LAB — CBC
HCT: 31.3 % — ABNORMAL LOW (ref 36.0–46.0)
Hemoglobin: 10.2 g/dL — ABNORMAL LOW (ref 12.0–15.0)
MCH: 31.9 pg (ref 26.0–34.0)
MCHC: 32.6 g/dL (ref 30.0–36.0)
MCV: 97.8 fL (ref 78.0–100.0)
PLATELETS: 210 10*3/uL (ref 150–400)
RBC: 3.2 MIL/uL — ABNORMAL LOW (ref 3.87–5.11)
RDW: 14.6 % (ref 11.5–15.5)
WBC: 9.2 10*3/uL (ref 4.0–10.5)

## 2015-04-07 LAB — TYPE AND SCREEN
ABO/RH(D): O POS
ANTIBODY SCREEN: NEGATIVE

## 2015-04-07 MED ORDER — BUTALBITAL-APAP-CAFFEINE 50-325-40 MG PO TABS
1.0000 | ORAL_TABLET | ORAL | Status: DC | PRN
  Administered 2015-04-07: 1 via ORAL
  Administered 2015-04-08: 2 via ORAL
  Administered 2015-04-09 – 2015-04-10 (×2): 1 via ORAL
  Administered 2015-04-10: 2 via ORAL
  Filled 2015-04-07: qty 1
  Filled 2015-04-07: qty 2
  Filled 2015-04-07 (×4): qty 1

## 2015-04-07 MED ORDER — SODIUM CHLORIDE 0.9 % IV SOLN
250.0000 mg | Freq: Four times a day (QID) | INTRAVENOUS | Status: AC
  Administered 2015-04-07 – 2015-04-08 (×8): 250 mg via INTRAVENOUS
  Filled 2015-04-07 (×9): qty 5

## 2015-04-07 MED ORDER — SODIUM CHLORIDE 0.9 % IV SOLN
2.0000 g | Freq: Four times a day (QID) | INTRAVENOUS | Status: AC
  Administered 2015-04-07 – 2015-04-08 (×8): 2 g via INTRAVENOUS
  Filled 2015-04-07 (×9): qty 2000

## 2015-04-07 MED ORDER — ZOLPIDEM TARTRATE 5 MG PO TABS: 5.0000 mg | ORAL_TABLET | Freq: Every evening | ORAL | Status: DC | PRN

## 2015-04-07 MED ORDER — LACTATED RINGERS IV SOLN
INTRAVENOUS | Status: DC
  Administered 2015-04-07: 02:00:00 via INTRAVENOUS
  Administered 2015-04-07: 125 mL/h via INTRAVENOUS
  Administered 2015-04-08 – 2015-04-09 (×3): via INTRAVENOUS

## 2015-04-07 MED ORDER — BUTORPHANOL TARTRATE 1 MG/ML IJ SOLN: 1.0000 mg | INTRAMUSCULAR | Status: DC | PRN

## 2015-04-07 NOTE — MAU Provider Note (Signed)
S: Alicia Castro is a 30 y.o. [phone] at [redacted]w[redacted]d who presents today with leaking of fluid. She denies any VB. She confirms fetal movement. She states that she awoke around 0100, and the bed was soaked with clear fluid.  O: VSS, afebrile Abdomen: soft, non-tender, gravid Grossly ruptured with fluid running down leg Uterus: AGA FHT: 140, moderate, 10x10 accels, no decels  Toco: irregular UCs  FERN: POSITIVE A/P: Exam for ROM RN will report to attending MD

## 2015-04-07 NOTE — MAU Note (Signed)
Pt presents via EMS complaining of possible SROM while she was sleeping. Pants wet upon arrival. Denies bleeding. Reports no movement since water broke.

## 2015-04-07 NOTE — H&P (Signed)
This is Dr. Francoise Ceo dictating the history and physical on  Alicia Castro  she's a 30 year old gravida 6 para 2122 at 32 weeks and 5 days [facility] 05/28/2015 patient has had 2 previous hospitalizations because of bleeding cause unknown and her membranes ruptured between 10 and 11 PM last night she is having irregular contractions and her cervix is 2 cm 50% vertex -2-3 the patient received betamethasone a week and half ago kind 2 doses she is now on ampicillin 2 g IV every 6 hours and erythromycin 250 every 6 hours and she will have him NSTs done daily Past medical history she had a fetal demise at 30 weeks in the previous pregnancy with this pregnancy she's using Prometrium 200 mg intravaginally daily Past surgical history negative Social history negative System review negative Physical exam well-developed female not in labor HEENT negative Lungs clear to P&A Heart regular rhythm no murmurs no gallops Breasts negative Abdomen uterus 3234 weeks size Pelvic as described above Extremities negative

## 2015-04-07 NOTE — Progress Notes (Signed)
MFM consult, Staff Note:  Alicia Castro been diagnosed and admitted for preterm premature rupture of membranes as established by her obstetrical provider. I was asked to evaluate her fetus and provide recommendations regarding the management of this diagnosis. I explained to her that in order for labor to occur either preterm or term, there has to be membrane activation, uterine contractility and cervical dilation, which of these manifest primarily varies from individual to individual and frequently it is a combination of synchronous and asynchronous activations that lead to eventual preterm delivery. Any number of precipitating events in combination may be the basis or pathologic activation of any of these pathways. Regardless, I explained to her that at this point our primary concern was for ascending intrauterine/intraamniotic infection, which poses risk to her as well as her fetus. She understood that inpatient management was essential with serial examinations and fetal heart rate tracings to screen for onset of infection and that evidence of such would prompt delivery.  I explained to her timing of delivery in absence of infection and in presence of reassuring fetal status has been debated historically and recently by many experts. Regardless, I cited to her that the best available and most well-accepted timing of delivery in context of otherwise reassuring maternal-fetus status affected by pPROM is at [redacted] weeks gestational age. This most fully balances the risk of prematurity against that of an occult intrauterine infection. Occult intrauterine infection is the second leading cause of cerebral palsy as opposed to the leading cause which is, of course, prematurity. She seemed to grasp the concept as well as our specialty's rationale.  In setting of reduced physical activity/modified bedrest due to pPROM with a breech fetus, it is important the patient have DVT prophylaxis (please see recommendations  below).  All of your patient's questions were addressed to her satisfaction today. Although this was a lot of information to take in, she demonstrated excellent comprehension of my impression, recommendations, and the underlying rationale.  Summary of Recommendations: 1. I agree with latency antibiotics course for 7 days; 2. Patient recently received antenatal corticosteroids <14 days ago.  No need to repeat.  3. Timing of delivery should be anticipated by 34 weeks provided that testing remains reassuring and there is no evidence of chorioamnionitis. 4. I would deliver prior to 34 weeks for nonreassuring fetal testing, evidence of chorioamnionitis, or other evidence of maternal or fetal deterioration. 5. Interval growth should be continually reassessed every 2-4 weeks pregnancy additionally complicated by PPROM (I recommend growth/AFI/presentation evaluation in 1 week from today).  6. The patient should be monitored by serial clinical exams, serial vital signs, daily EFM's/NST's (more often if clinically warranted), CBC only as clinically indicated (noting that WBC at or above 20,000 constitutes leukocytosis for pregnancy). 7. NICU consultation is recommended.  8. Given that the patient is pregnant with pPROM on reduced activity in hospital, I recommend continued thromboprophylaxis. Risks/benefits/alternatives for DVT prophylaxis may be considered such as UFH (heparin), LMWH (Lovenox), SCD's (>18 hours daily), and TED hose (24 hours/day). I will leave this to your discretion. 9.  I would continue weekly Makena until delivery but discontinue vaginal progesterone.  Time Spent: I spent in excess of 30 minutes in consultation with this patient to review records, evaluate her case, and provide her with an adequate discussion and education.  More than 50% of this time was spent in direct face-to-face counseling.  It was a pleasure seeing your patient in the office today.  Thank you for consultation.    Please do not hesitate to contact our service for any further questions.   Thank you,  Rola Lennon Morgan Jerald Hennington   Elaijah Munoz Morgan, MD, MS, FACOG Assistant Professor Section of Maternal-Fetal Medicine [employer]     

## 2015-04-07 NOTE — Consult Note (Signed)
MFM consult, Staff Note:  Alicia Castro been diagnosed and admitted for preterm premature rupture of membranes as established by her obstetrical provider. I was asked to evaluate her fetus and provide recommendations regarding the management of this diagnosis. I explained to her that in order for labor to occur either preterm or term, there has to be membrane activation, uterine contractility and cervical dilation, which of these manifest primarily varies from individual to individual and frequently it is a combination of synchronous and asynchronous activations that lead to eventual preterm delivery. Any number of precipitating events in combination may be the basis or pathologic activation of any of these pathways. Regardless, I explained to her that at this point our primary concern was for ascending intrauterine/intraamniotic infection, which poses risk to her as well as her fetus. She understood that inpatient management was essential with serial examinations and fetal heart rate tracings to screen for onset of infection and that evidence of such would prompt delivery.  I explained to her timing of delivery in absence of infection and in presence of reassuring fetal status has been debated historically and recently by many experts. Regardless, I cited to her that the best available and most well-accepted timing of delivery in context of otherwise reassuring maternal-fetus status affected by pPROM is at [redacted] weeks gestational age. This most fully balances the risk of prematurity against that of an occult intrauterine infection. Occult intrauterine infection is the second leading cause of cerebral palsy as opposed to the leading cause which is, of course, prematurity. She seemed to grasp the concept as well as our specialty's rationale.  In setting of reduced physical activity/modified bedrest due to pPROM with a breech fetus, it is important the patient have DVT prophylaxis (please see recommendations  below).  All of your patient's questions were addressed to her satisfaction today. Although this was a lot of information to take in, she demonstrated excellent comprehension of my impression, recommendations, and the underlying rationale.  Summary of Recommendations: 1. I agree with latency antibiotics course for 7 days; 2. Patient recently received antenatal corticosteroids <14 days ago.  No need to repeat.  3. Timing of delivery should be anticipated by 34 weeks provided that testing remains reassuring and there is no evidence of chorioamnionitis. 4. I would deliver prior to 34 weeks for nonreassuring fetal testing, evidence of chorioamnionitis, or other evidence of maternal or fetal deterioration. 5. Interval growth should be continually reassessed every 2-4 weeks pregnancy additionally complicated by PPROM (I recommend growth/AFI/presentation evaluation in 1 week from today).  6. The patient should be monitored by serial clinical exams, serial vital signs, daily EFM's/NST's (more often if clinically warranted), CBC only as clinically indicated (noting that WBC at or above 20,000 constitutes leukocytosis for pregnancy). 7. NICU consultation is recommended.  8. Given that the patient is pregnant with pPROM on reduced activity in hospital, I recommend continued thromboprophylaxis. Risks/benefits/alternatives for DVT prophylaxis may be considered such as UFH (heparin), LMWH (Lovenox), SCD's (>18 hours daily), and TED hose (24 hours/day). I will leave this to your discretion. 9.  I would continue weekly Makena until delivery but discontinue vaginal progesterone.  Time Spent: I spent in excess of 30 minutes in consultation with this patient to review records, evaluate her case, and provide her with an adequate discussion and education.  More than 50% of this time was spent in direct face-to-face counseling.  It was a pleasure seeing your patient in the office today.  Thank you for consultation.  Please do not hesitate to contact our service for any further questions.   Thank you,  Alicia Castro, Alicia Sjogren, MD, MS, FACOG Assistant Professor Section of Maternal-Fetal Medicine [facility]

## 2015-04-07 NOTE — Progress Notes (Signed)
UR chart review completed.  

## 2015-04-08 ENCOUNTER — Ambulatory Visit (HOSPITAL_COMMUNITY): Admission: RE | Admit: 2015-04-08 | Payer: Medicaid Other | Source: Ambulatory Visit

## 2015-04-08 MED ORDER — AMOXICILLIN 500 MG PO CAPS
500.0000 mg | ORAL_CAPSULE | Freq: Three times a day (TID) | ORAL | Status: AC
  Administered 2015-04-09 – 2015-04-13 (×15): 500 mg via ORAL
  Filled 2015-04-08 (×15): qty 1

## 2015-04-08 MED ORDER — ERYTHROMYCIN BASE 250 MG PO TBEC
250.0000 mg | DELAYED_RELEASE_TABLET | Freq: Four times a day (QID) | ORAL | Status: AC
  Administered 2015-04-09 – 2015-04-13 (×20): 250 mg via ORAL
  Filled 2015-04-08 (×26): qty 1

## 2015-04-08 NOTE — Progress Notes (Signed)
Patient ID: Alicia Castro, female   DOB: Aug 02, 1985, 30 y.o.   MRN: [id number] Vital signs normal Minimal leakage no contractions she is on her ampicillin and erythromycin continue present therapy

## 2015-04-08 NOTE — Progress Notes (Signed)
FHR dopplered via hand holding US transducer.  FHR accel to 180 noted

## 2015-04-09 MED ORDER — PRENATAL MULTIVITAMIN CH
1.0000 | ORAL_TABLET | Freq: Every day | ORAL | Status: DC
  Administered 2015-04-09 – 2015-04-15 (×8): 1 via ORAL
  Filled 2015-04-09 (×8): qty 1

## 2015-04-09 MED ORDER — SODIUM CHLORIDE 0.9% FLUSH
3.0000 mL | INTRAVENOUS | Status: DC | PRN
  Filled 2015-04-09: qty 3

## 2015-04-09 MED ORDER — SODIUM CHLORIDE 0.9% FLUSH
3.0000 mL | Freq: Two times a day (BID) | INTRAVENOUS | Status: DC
  Administered 2015-04-09 – 2015-04-15 (×14): 3 mL via INTRAVENOUS

## 2015-04-09 NOTE — Progress Notes (Signed)
Patient ID: Alicia Castro, female   DOB: 1985/11/04, 30 y.o.   MRN: [id number] Blood pressure 9844 afebrile pulse 84 respiration 18 Used 4 pads yesterday No contractions No change

## 2015-04-10 NOTE — Progress Notes (Signed)
Patient ID: Alicia Castro, female   DOB: 1985/10/21, 30 y.o.   MRN: [id number] Blood pressure 99/54 afebrile pulse 93 respiration 18 Still leaking No contractions Status unchanged she is on by mouth antibiotics

## 2015-04-11 NOTE — Progress Notes (Signed)
Patient ID: Alicia Castro, female   DOB: 06-17-1985, 30 y.o.   MRN: [id number] Blood pressure 107/58 afebrile pulse 98 respiration 20 Used 4 pads yesterday to be induced at 34 weeks

## 2015-04-12 ENCOUNTER — Encounter (HOSPITAL_COMMUNITY): Payer: Self-pay | Admitting: General Practice

## 2015-04-12 NOTE — Progress Notes (Signed)
Patient ID: Alicia Castro, female   DOB: 12-Aug-1985, 30 y.o.   MRN: [id number] Blood pressure low 109/53 pulse 99 respiration 18 temp 98 3 Used 4 pads yesterday No contractions Status unchanged

## 2015-04-13 ENCOUNTER — Inpatient Hospital Stay (HOSPITAL_COMMUNITY): Payer: Medicaid Other

## 2015-04-13 NOTE — Progress Notes (Signed)
Patient ID: Alicia Castro, female   DOB: 1985/06/12, 30 y.o.   MRN: [id number] Blood pressure 90 825 afebrile pulse 91 respiration 18 Leaking fluid use 3 pads last yesterday status unchanged

## 2015-04-13 NOTE — Progress Notes (Signed)
Initial Nutrition Assessment  DOCUMENTATION CODES:   Obesity unspecified  INTERVENTION:  Regular diet, snacks TID   NUTRITION DIAGNOSIS:  Increased nutrient needs related to  (pregnancy and fetal growth requirements) as evidenced by  (33 weeks IUP).  GOAL:  Patient will meet greater than or equal to 90% of their needs  MONITOR:  Weight trend   REASON FOR ASSESSMENT:  Antenatal   ASSESSMENT:  33 4/7 weeks, PROM. pt states good appetite, diet tolerated well. Pre-preg weight 190 lbs, BMI 32.6. 18 Lb weight gain  Diet Order:  Diet regular Room service appropriate?: Yes; Fluid consistency:: Thin  Skin:  Reviewed, no issues  Height:   Ht Readings from Last 1 Encounters:  04/07/15  (1.626 m)   Weight:   Wt Readings from Last 1 Encounters:  04/08/15 208 lb 3.2 oz (94.439 kg)   Ideal Body Weight:     BMI:  Body mass index is 35.72 kg/(m^2).  Estimated Nutritional Needs:   Kcal:  2000-2200  Protein:  90-100 g  Fluid:  2.3 L  EDUCATION NEEDS: none identified at this time     Inez Pilgrim.Odis Luster LDN Neonatal Nutrition Support Specialist/RD III Pager [phone]      Phone [phone]

## 2015-04-14 LAB — GROUP B STREP BY PCR: GROUP B STREP BY PCR: NEGATIVE

## 2015-04-14 NOTE — Progress Notes (Signed)
Patient ID: Alicia Castro, female   DOB: 08-18-1985, 30 y.o.   MRN: [id number] Blood pressure 1076D respiration 16 afebrile used 3 pads yesterday and she is for induction at 34 weeks on Thursday

## 2015-04-15 ENCOUNTER — Ambulatory Visit (HOSPITAL_COMMUNITY): Admission: RE | Admit: 2015-04-15 | Payer: Medicaid Other | Source: Ambulatory Visit

## 2015-04-15 NOTE — Progress Notes (Signed)
Patient ID: Alicia Castro, female   DOB: 1985-09-10, 30 y.o.   MRN: [id number] Blood pressure 93 will 57 pulse 82 respiration 16 afebrile Patient will be induced [name] 34 weeks she uses 3-4 pads a day

## 2015-04-16 ENCOUNTER — Inpatient Hospital Stay (HOSPITAL_COMMUNITY): Payer: Medicaid Other | Admitting: Anesthesiology

## 2015-04-16 ENCOUNTER — Inpatient Hospital Stay (HOSPITAL_COMMUNITY): Admission: RE | Admit: 2015-04-16 | Payer: Medicaid Other | Source: Ambulatory Visit

## 2015-04-16 ENCOUNTER — Encounter (HOSPITAL_COMMUNITY): Payer: Self-pay | Admitting: *Deleted

## 2015-04-16 LAB — TYPE AND SCREEN
ABO/RH(D): O POS
Antibody Screen: NEGATIVE

## 2015-04-16 LAB — CBC
HCT: 30.7 % — ABNORMAL LOW (ref 36.0–46.0)
HEMOGLOBIN: 10.1 g/dL — AB (ref 12.0–15.0)
MCH: 31.9 pg (ref 26.0–34.0)
MCHC: 32.9 g/dL (ref 30.0–36.0)
MCV: 96.8 fL (ref 78.0–100.0)
Platelets: 183 10*3/uL (ref 150–400)
RBC: 3.17 MIL/uL — AB (ref 3.87–5.11)
RDW: 14.6 % (ref 11.5–15.5)
WBC: 9.7 10*3/uL (ref 4.0–10.5)

## 2015-04-16 MED ORDER — OXYCODONE-ACETAMINOPHEN 5-325 MG PO TABS
1.0000 | ORAL_TABLET | ORAL | Status: DC | PRN
Start: 2015-04-16 — End: 2015-04-18

## 2015-04-16 MED ORDER — DIPHENHYDRAMINE HCL 25 MG PO CAPS: 25.0000 mg | ORAL_CAPSULE | Freq: Four times a day (QID) | ORAL | Status: DC | PRN

## 2015-04-16 MED ORDER — DIPHENHYDRAMINE HCL 50 MG/ML IJ SOLN: 12.5000 mg | INTRAMUSCULAR | Status: DC | PRN

## 2015-04-16 MED ORDER — LIDOCAINE HCL (PF) 1 % IJ SOLN
INTRAMUSCULAR | Status: DC | PRN
  Administered 2015-04-16: 6 mL via EPIDURAL
  Administered 2015-04-16: 3 mL via EPIDURAL

## 2015-04-16 MED ORDER — LANOLIN HYDROUS EX OINT: TOPICAL_OINTMENT | CUTANEOUS | Status: DC | PRN

## 2015-04-16 MED ORDER — LACTATED RINGERS IV SOLN: 2.5000 [IU]/h | INTRAVENOUS | Status: DC

## 2015-04-16 MED ORDER — IBUPROFEN 600 MG PO TABS
600.0000 mg | ORAL_TABLET | Freq: Four times a day (QID) | ORAL | Status: DC
  Administered 2015-04-16 – 2015-04-18 (×6): 600 mg via ORAL
  Filled 2015-04-16 (×6): qty 1

## 2015-04-16 MED ORDER — ZOLPIDEM TARTRATE 5 MG PO TABS: 5.0000 mg | ORAL_TABLET | Freq: Every evening | ORAL | Status: DC | PRN

## 2015-04-16 MED ORDER — BENZOCAINE-MENTHOL 20-0.5 % EX AERO: 1.0000 "application " | INHALATION_SPRAY | CUTANEOUS | Status: DC | PRN

## 2015-04-16 MED ORDER — LIDOCAINE HCL (PF) 1 % IJ SOLN
30.0000 mL | INTRAMUSCULAR | Status: DC | PRN
  Filled 2015-04-16: qty 30

## 2015-04-16 MED ORDER — EPHEDRINE 5 MG/ML INJ: 10.0000 mg | INTRAVENOUS | Status: DC | PRN

## 2015-04-16 MED ORDER — LACTATED RINGERS IV SOLN: INTRAVENOUS | Status: DC

## 2015-04-16 MED ORDER — FLEET ENEMA 7-19 GM/118ML RE ENEM
1.0000 | ENEMA | RECTAL | Status: DC | PRN
Start: 2015-04-16 — End: 2015-04-16

## 2015-04-16 MED ORDER — DIBUCAINE 1 % RE OINT: 1.0000 "application " | TOPICAL_OINTMENT | RECTAL | Status: DC | PRN

## 2015-04-16 MED ORDER — SENNOSIDES-DOCUSATE SODIUM 8.6-50 MG PO TABS
2.0000 | ORAL_TABLET | ORAL | Status: DC
  Administered 2015-04-16 – 2015-04-17 (×2): 2 via ORAL
  Filled 2015-04-16 (×2): qty 2

## 2015-04-16 MED ORDER — OXYTOCIN 10 UNIT/ML IJ SOLN
1.0000 m[IU]/min | INTRAMUSCULAR | Status: DC
  Administered 2015-04-16: 2 m[IU]/min via INTRAVENOUS
  Filled 2015-04-16: qty 4

## 2015-04-16 MED ORDER — SIMETHICONE 80 MG PO CHEW: 80.0000 mg | CHEWABLE_TABLET | ORAL | Status: DC | PRN

## 2015-04-16 MED ORDER — EPHEDRINE 5 MG/ML INJ
10.0000 mg | INTRAVENOUS | Status: DC | PRN
  Filled 2015-04-16: qty 2

## 2015-04-16 MED ORDER — BUTORPHANOL TARTRATE 1 MG/ML IJ SOLN: 1.0000 mg | INTRAMUSCULAR | Status: DC | PRN

## 2015-04-16 MED ORDER — PHENYLEPHRINE 40 MCG/ML (10ML) SYRINGE FOR IV PUSH (FOR BLOOD PRESSURE SUPPORT): 80.0000 ug | PREFILLED_SYRINGE | INTRAVENOUS | Status: DC | PRN

## 2015-04-16 MED ORDER — FENTANYL 2.5 MCG/ML BUPIVACAINE 1/10 % EPIDURAL INFUSION (WH - ANES)
14.0000 mL/h | INTRAMUSCULAR | Status: DC | PRN
  Administered 2015-04-16: 14 mL/h via EPIDURAL
  Filled 2015-04-16: qty 125

## 2015-04-16 MED ORDER — FERROUS SULFATE 325 (65 FE) MG PO TABS
325.0000 mg | ORAL_TABLET | Freq: Two times a day (BID) | ORAL | Status: DC
  Administered 2015-04-17 (×2): 325 mg via ORAL
  Filled 2015-04-16 (×2): qty 1

## 2015-04-16 MED ORDER — TETANUS-DIPHTH-ACELL PERTUSSIS 5-2.5-18.5 LF-MCG/0.5 IM SUSP: 0.5000 mL | Freq: Once | INTRAMUSCULAR | Status: DC

## 2015-04-16 MED ORDER — PHENYLEPHRINE 40 MCG/ML (10ML) SYRINGE FOR IV PUSH (FOR BLOOD PRESSURE SUPPORT)
80.0000 ug | PREFILLED_SYRINGE | INTRAVENOUS | Status: DC | PRN
  Filled 2015-04-16: qty 2
  Filled 2015-04-16: qty 20

## 2015-04-16 MED ORDER — ACETAMINOPHEN 325 MG PO TABS: 650.0000 mg | ORAL_TABLET | ORAL | Status: DC | PRN

## 2015-04-16 MED ORDER — LACTATED RINGERS IV SOLN: 500.0000 mL | INTRAVENOUS | Status: DC | PRN

## 2015-04-16 MED ORDER — WITCH HAZEL-GLYCERIN EX PADS: 1.0000 "application " | MEDICATED_PAD | CUTANEOUS | Status: DC | PRN

## 2015-04-16 MED ORDER — OXYCODONE-ACETAMINOPHEN 5-325 MG PO TABS
2.0000 | ORAL_TABLET | ORAL | Status: DC | PRN
Start: 2015-04-16 — End: 2015-04-16

## 2015-04-16 MED ORDER — ONDANSETRON HCL 4 MG PO TABS: 4.0000 mg | ORAL_TABLET | ORAL | Status: DC | PRN

## 2015-04-16 MED ORDER — CITRIC ACID-SODIUM CITRATE 334-500 MG/5ML PO SOLN: 30.0000 mL | ORAL | Status: DC | PRN

## 2015-04-16 MED ORDER — PRENATAL MULTIVITAMIN CH
1.0000 | ORAL_TABLET | Freq: Every day | ORAL | Status: DC
  Administered 2015-04-17: 1 via ORAL
  Filled 2015-04-16: qty 1

## 2015-04-16 MED ORDER — OXYTOCIN BOLUS FROM INFUSION: 500.0000 mL | INTRAVENOUS | Status: DC

## 2015-04-16 MED ORDER — ONDANSETRON HCL 4 MG/2ML IJ SOLN: 4.0000 mg | INTRAMUSCULAR | Status: DC | PRN

## 2015-04-16 MED ORDER — ONDANSETRON HCL 4 MG/2ML IJ SOLN: 4.0000 mg | Freq: Four times a day (QID) | INTRAMUSCULAR | Status: DC | PRN

## 2015-04-16 MED ORDER — TERBUTALINE SULFATE 1 MG/ML IJ SOLN: 0.2500 mg | Freq: Once | INTRAMUSCULAR | Status: DC | PRN

## 2015-04-16 NOTE — Anesthesia Procedure Notes (Signed)
Epidural Patient location during procedure: OB Start time: 04/16/2015 10:08 AM End time: 04/16/2015 10:20 AM  Staffing Anesthesiologist: Jairo Ben Performed by: anesthesiologist   Preanesthetic Checklist Completed: patient identified, surgical consent, pre-op evaluation, timeout performed, IV checked, risks and benefits discussed and monitors and equipment checked  Epidural Patient position: sitting Prep: DuraPrep Patient monitoring: heart rate, continuous pulse ox and blood pressure Approach: midline Location: L2-L3 Injection technique: LOR air  Needle:  Needle type: Tuohy  Needle gauge: 17 G Needle length: 9 cm Needle insertion depth: 5.5 cm Catheter at skin depth: 12 cm Test dose: negative  Additional Notes LOR 5.5 cm Pt identified in Labor room.  Monitors applied. Working IV access confirmed. Sterile prep, drape lumbar spine. 1% lido local L 3,4.  #17 ga Touhy to LOR air 5.5 cm second pass (1st pass heme).  Cath in easily, 3cc 1% lido negative test dose.  Patient asymptomatic, VSS, no heme aspirated, tolerated well.  Sandford Craze, MDReason for block:procedure for pain

## 2015-04-16 NOTE — Progress Notes (Signed)
Patient ID: Alicia Castro, female   DOB: 1985-12-23, 30 y.o.   MRN: [id number] Blood pressure 99 over the 59 pulse 92 afebrile Patient's 34 weeks today and will be induced using Pitocin

## 2015-04-16 NOTE — Progress Notes (Signed)
Pt transferred to [facility] for scheduled IOL.  Report given to L. Edmonds-McNally, RN.

## 2015-04-16 NOTE — Anesthesia Preprocedure Evaluation (Addendum)
Anesthesia Evaluation  Patient identified by MRN, date of birth, ID band Patient awake    Reviewed: Allergy & Precautions, NPO status , Patient's Chart, lab work & pertinent test results  History of Anesthesia Complications Negative for: history of anesthetic complications  Airway Mallampati: I  TM Distance: >3 FB Neck ROM: Full    Dental  (+) Chipped, Missing, Dental Advisory Given   Pulmonary Current Smoker,    breath sounds clear to auscultation       Cardiovascular hypertension, Pt. on medications  Rhythm:Regular Rate:Normal     Neuro/Psych  Headaches,    GI/Hepatic negative GI ROS, (+)     substance abuse  marijuana use,   Endo/Other    Renal/GU      Musculoskeletal   Abdominal   Peds  Hematology Hb 10.1, plt 183   Anesthesia Other Findings   Reproductive/Obstetrics (+) Pregnancy H/o eclampsia with delivery                           Anesthesia Physical Anesthesia Plan  ASA: II  Anesthesia Plan: Epidural   Post-op Pain Management:    Induction:   Airway Management Planned:   Additional Equipment:   Intra-op Plan:   Post-operative Plan:   Informed Consent: I have reviewed the patients History and Physical, chart, labs and discussed the procedure including the risks, benefits and alternatives for the proposed anesthesia with the patient or authorized representative who has indicated his/her understanding and acceptance.   Dental advisory given  Plan Discussed with: CRNA  Anesthesia Plan Comments: (R/B LEA discussed with patient, who accepts)        Anesthesia Quick Evaluation

## 2015-04-16 NOTE — Progress Notes (Signed)
Patient ID: Alicia Castro, female   DOB: [birth year], 30 y.o.   MRN: [id number] Patient being induced at 34 weeks because of prolonged ruptured membranes cervix is no 4 cm on 100% vertex -1 and she still leaking clear fluid she has her epidural and is contracting every 2 minutes

## 2015-04-17 LAB — CBC
HEMATOCRIT: 32.5 % — AB (ref 36.0–46.0)
Hemoglobin: 10.7 g/dL — ABNORMAL LOW (ref 12.0–15.0)
MCH: 31.8 pg (ref 26.0–34.0)
MCHC: 32.9 g/dL (ref 30.0–36.0)
MCV: 96.7 fL (ref 78.0–100.0)
Platelets: 214 10*3/uL (ref 150–400)
RBC: 3.36 MIL/uL — ABNORMAL LOW (ref 3.87–5.11)
RDW: 14.4 % (ref 11.5–15.5)
WBC: 10.7 10*3/uL — AB (ref 4.0–10.5)

## 2015-04-17 LAB — RPR: RPR: NONREACTIVE

## 2015-04-17 NOTE — Progress Notes (Signed)
Psychosocial assessment completed.  Full documentation to follow.  CSW identifies no barriers to discharge.  31 day bus pass provided to MOB so that she can return to visit baby at any time.

## 2015-04-17 NOTE — Progress Notes (Signed)
Patient ID: TINIA ORAVEC, female   DOB: 30-Aug-1985, 30 y.o.   MRN: [id number] Postpartum day one Blood pressure 100/62 pulse 68 respiration 14 Fundus firm Lochia moderate Legs negative doing well

## 2015-04-17 NOTE — Anesthesia Postprocedure Evaluation (Signed)
Anesthesia Post Note  Patient: Alicia Castro  Procedure(s) Performed: * No procedures listed *  Patient location during evaluation: [facility] Unit Anesthesia Type: Epidural Level of consciousness: awake Pain management: pain level controlled Vital Signs Assessment: post-procedure vital signs reviewed and stable Respiratory status: spontaneous breathing Cardiovascular status: blood pressure returned to baseline and stable Postop Assessment: no headache, patient able to bend at knees, no signs of nausea or vomiting and adequate PO intake Anesthetic complications: no    Last Vitals:  Filed Vitals:   04/16/15 2129 04/17/15 0546  BP: 108/59 100/62  Pulse: 83 68  Temp: 36.8 C 36.8 C  Resp: 18 14    Last Pain:  Filed Vitals:   04/17/15 0625  PainSc: Asleep                 [name]

## 2015-04-18 MED ORDER — FUSION PLUS PO CAPS: 1.0000 | ORAL_CAPSULE | Freq: Every day | ORAL | Status: DC

## 2015-04-18 MED ORDER — OXYCODONE-ACETAMINOPHEN 5-325 MG PO TABS: 1.0000 | ORAL_TABLET | ORAL | Status: DC | PRN

## 2015-04-18 MED ORDER — IBUPROFEN 600 MG PO TABS: 600.0000 mg | ORAL_TABLET | Freq: Four times a day (QID) | ORAL | Status: DC | PRN

## 2015-04-18 NOTE — Progress Notes (Signed)

## 2015-04-18 NOTE — Progress Notes (Signed)
Post Partum Day 2 Subjective: no complaints  Objective: Blood pressure 100/81, pulse 74, temperature 98.3 F (36.8 C), temperature source Oral, resp. rate 18, height  (1.626 m), weight 193 lb 4.8 oz (87.68 kg), last menstrual period 08/21/2014, SpO2 100 %, unknown if currently breastfeeding.  Physical Exam:  General: alert and no distress Lochia: appropriate Uterine Fundus: firm Incision: none DVT Evaluation: No evidence of DVT seen on physical exam.   Recent Labs  04/16/15 0641 04/17/15 0530  HGB 10.1* 10.7*  HCT 30.7* 32.5*    Assessment/Plan: Discharge home   LOS: 11 days   Alicia Castro A 04/18/2015, 6:09 AM

## 2015-04-18 NOTE — Discharge Summary (Signed)
Obstetric Discharge Summary Reason for Admission: rupture of membranes, PPROM Prenatal Procedures: NST and ultrasound Intrapartum Procedures: spontaneous vaginal delivery Postpartum Procedures: none Complications-Operative and Postpartum: none HEMOGLOBIN  Date Value Ref Range Status  04/17/2015 10.7* 12.0 - 15.0 g/dL Final   HCT  Date Value Ref Range Status  04/17/2015 32.5* 36.0 - 46.0 % Final    Physical Exam:  General: alert and no distress Lochia: appropriate Uterine Fundus: firm Incision: none DVT Evaluation: No evidence of DVT seen on physical exam.  Discharge Diagnoses: Preterm Pregnancy, PPROM, delivered.  Discharge Information: Date: 04/18/2015 Activity: pelvic rest Diet: routine Medications: PNV, Ibuprofen, Colace, Iron and Percocet Condition: stable Instructions: refer to practice specific booklet Discharge to: home Follow-up Information    Follow up with MARSHALL,BERNARD A, MD In 6 weeks.   Specialty:  Obstetrics and Gynecology   Contact information:   [address] [place] [address] [phone]       Newborn Data: Live born female  Birth Weight:   APGAR: 8, 8  Home with mother.  [name],[name] 04/18/2015, 6:13 AM

## 2015-04-20 NOTE — Clinical Social Work Maternal (Addendum)
CLINICAL SOCIAL WORK MATERNAL/CHILD NOTE  Patient Details  Name: Alicia Castro MRN: [id number] Date of Birth: Jun 18, 1985  Date:  04/17/2015  Clinical Social Worker Initiating Note:  Fabien Travelstead E. Brigitte Pulse, Bryan Date/ Time Initiated:  04/17/15/1300     Child's Name:  Alicia Castro   Legal Guardian:  Mother   Need for Interpreter:  None   Date of Referral:        Reason for Referral:   (No referral-NICU admission.  Hx marijuana use.)   Referral Source:      Address:  [address], [place], [place] [address]  Phone number:  [phone]   Household Members:  Minor Children, Parents (MOB has two other children: Tahjae/son, age 67 and Tayana/daughter, age 77.  Her mother also lives in the home.)   Natural Supports (not living in the home):  Friends, Immediate Family, Parent   Professional Supports: None (MOB is not open to counseling)   Employment: Retail buyer, Ship broker   Type of Work:  Copy)   Education:  Attending college (MOB is attending [employer] for [employer].)   Financial Resources:  Medicaid   Other Resources:  Physicist, medical , [place] Considerations Which May Impact Care: None stated.  MOB's facesheet notes religion as Non-Denominational.  Strengths:  Ability to meet basic needs , Understanding of illness, Compliance with medical plan    Risk Factors/Current Problems:  Transportation , Mental Health Concerns , Substance Use , Family/Relationship Issues    Cognitive State:  Alert , Able to Concentrate , Goal Oriented , Insightful    Mood/Affect:  Interested , Tearful , Calm    CSW Assessment: CSW met with MOB in her third floor room/312 to introduce services, offer support, and complete assessment due to baby's admission to NICU at 34 weeks.  MOB was initially difficult to engage and presented with a flat affect, but eventually opened up and seemed appreciative of the opportunity to speak with CSW. MOB reports feelings  of sadness that baby has been born prematurely and admitted to NICU.  CSW validated and normalized feelings.  MOB states this is her third child, but first experience with a NICU admission.  She seems to have a good understanding of baby's needs at this time and provided CSW with an update. CSW inquired about MOB's support system.  She states that she and her children live with her mother, but that they do not get along.  She states plans to get her own place as soon as possible and notes her student aid and tax refund as potential financial resources to do so.  She also states she works part time at [employer], but hopes to find a full time job.  She states that although she and her mother do not get along, that she can stay at her mother's home until she has her own place.  She states that even when her mother tells her to get out, she knows that her mother will always be there to help, and feels this is her res[ponsibility as a mother.  CSW offered a list of income based housing.  MOB states she plans to apply for housing in [facility] and states that she thinks she has a job lined up with [employer] since her cousin works there and has "put in a good word."   CSW asked MOB about her emotional wellbeing and she reports, "I have back and forth depression kind of thing."  CSW asked her to elaborate.  She states she has never been diagnosed, but has struggled with depression ever since losing a pregnancy in 2008.  She reports no treatment and states no interest in counseling or medication at this time.  She reports, "I don't like taking medicine" and does not like other people knowing her business.  CSW discussed symptoms and coping mechanisms at length.  She states she often gets angry with herself and has a tendency to withdraw.  She does not feel her child suffer from her symptoms because her coping mechanisms usually involve taking her children places.  She clarifies that she isolates herself from friends  and family whom often let her down and upset her.  She reports things are more difficult now because her car is not drivable.  She states she used to "just leave" when she felt depressed, and take her kids somewhere, but that this this difficult without transportation.  CSW informed her of a potential resource for getting her car fixed and she agrees for CSW to see if this is available to her.  She reports she likes to go outside when she is feeling upset and also notes that she feels better when she prays and reads the Bible.  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  CSW suggests keeping these positive coping strategies in mind when feeling emotional about the baby's medical situation.  CSW provided education regarding signs and symptoms of PPD and asked that MOB talk with CSW and or her doctor if she has concerns at any time.  MOB agreed. CSW inquired about involvement by the FOB.  She reports that he is not involved.  CSW asked if MOB feels this situation is a source of stress for her and she replied, "I'm fine."  She states her father is a good support for her and helps her financially at times.  She has a few friends, but feels she cannot rely on them.   CSW asked about preparedness at home.  MOB states she is not prepared for baby at this time and states concern about affording baby supplies.  CSW offered assistance through [employer] in getting her started with some basic items.  MOB was appreciative.  She does not have a bed for baby yet, but states she thinks she will be able to get one prior to baby's discharge.  CSW asked that she let CSW know if she cannot and briefly reviewed SIDS precautions.   CSW discussed documented marijuana use in MOB's chart.  She admits to using on a regular basis to help her with her anger, appetite, and as a way to calm down when she is upset.  CSW reviewed the coping mechanisms already discussed and  encouraged her to turn to these things rather than smoking marijuana.  CSW explained hospital drug screen policy and mandated reporting.  MOB states understanding and that she was in this situation when her last child was born two years ago.  CSW informed her that baby's UDS is negative.   CSW will provided MOB with a 31 day bus pass, as she states this is her current mode of transportation while her car is not running.  CSW will make referral to [employer] for baby basics.  CSW will contact [employer] to inquire about assistance in getting her car fixed.  CSW will provide MOB with an income based housing list.  CSW explained ongoing support services offered by  NICU CSW and gave contact information.    CSW Plan/Description:  Child Copy Report , Engineer, mining , Psychosocial Support and Ongoing Assessment of Needs, Information/Referral to [place], [place], [place] 04/17/2015, 2:00 PM

## 2015-04-22 ENCOUNTER — Ambulatory Visit (HOSPITAL_COMMUNITY): Payer: Medicaid Other

## 2015-04-29 ENCOUNTER — Ambulatory Visit (HOSPITAL_COMMUNITY): Payer: Medicaid Other

## 2015-05-06 ENCOUNTER — Ambulatory Visit (HOSPITAL_COMMUNITY): Payer: Medicaid Other

## 2015-05-13 ENCOUNTER — Ambulatory Visit (HOSPITAL_COMMUNITY): Payer: Medicaid Other

## 2015-06-10 ENCOUNTER — Ambulatory Visit (HOSPITAL_COMMUNITY): Payer: Medicaid Other

## 2015-07-01 ENCOUNTER — Encounter (HOSPITAL_COMMUNITY): Payer: Self-pay | Admitting: *Deleted

## 2015-07-01 ENCOUNTER — Emergency Department (HOSPITAL_COMMUNITY)
Admission: EM | Admit: 2015-07-01 | Discharge: 2015-07-01 | Disposition: A | Discharge status: Home / Self Care | Payer: Medicaid Other | Attending: Emergency Medicine | Admitting: Emergency Medicine

## 2015-07-01 DIAGNOSIS — F17210 Nicotine dependence, cigarettes, uncomplicated: Secondary | ICD-10-CM | POA: Insufficient documentation

## 2015-07-01 DIAGNOSIS — R3915 Urgency of urination: Secondary | ICD-10-CM | POA: Insufficient documentation

## 2015-07-01 DIAGNOSIS — R350 Frequency of micturition: Secondary | ICD-10-CM | POA: Insufficient documentation

## 2015-07-01 LAB — URINE MICROSCOPIC-ADD ON: SQUAMOUS EPITHELIAL / LPF: NONE SEEN

## 2015-07-01 LAB — URINALYSIS, ROUTINE W REFLEX MICROSCOPIC
BILIRUBIN URINE: NEGATIVE
Glucose, UA: NEGATIVE mg/dL
Ketones, ur: NEGATIVE mg/dL
NITRITE: NEGATIVE
PH: 6.5 (ref 5.0–8.0)
Protein, ur: NEGATIVE mg/dL
SPECIFIC GRAVITY, URINE: 1.008 (ref 1.005–1.030)

## 2015-07-01 LAB — POC URINE PREG, ED: Preg Test, Ur: NEGATIVE

## 2015-07-01 NOTE — ED Notes (Signed)
Pt c/o lower back pain x 2 days; pt c/o urinary frequency and urgency; pt states "I think I have a UTI"

## 2015-07-01 NOTE — ED Notes (Signed)
Pt advised registration that she was leaving; pt was seen by registration walking out the ER doors

## 2015-10-21 ENCOUNTER — Emergency Department (HOSPITAL_COMMUNITY): Payer: Medicaid Other

## 2015-10-21 ENCOUNTER — Encounter (HOSPITAL_COMMUNITY): Payer: Self-pay | Admitting: *Deleted

## 2015-10-21 ENCOUNTER — Emergency Department (HOSPITAL_COMMUNITY)
Admission: EM | Admit: 2015-10-21 | Discharge: 2015-10-21 | Disposition: A | Discharge status: Home / Self Care | Payer: Medicaid Other | Attending: Emergency Medicine | Admitting: Emergency Medicine

## 2015-10-21 DIAGNOSIS — R1013 Epigastric pain: Secondary | ICD-10-CM | POA: Diagnosis not present

## 2015-10-21 DIAGNOSIS — N390 Urinary tract infection, site not specified: Secondary | ICD-10-CM | POA: Diagnosis not present

## 2015-10-21 DIAGNOSIS — R1011 Right upper quadrant pain: Secondary | ICD-10-CM | POA: Diagnosis present

## 2015-10-21 DIAGNOSIS — F17210 Nicotine dependence, cigarettes, uncomplicated: Secondary | ICD-10-CM | POA: Diagnosis not present

## 2015-10-21 LAB — URINALYSIS, ROUTINE W REFLEX MICROSCOPIC
Bilirubin Urine: NEGATIVE
Glucose, UA: NEGATIVE mg/dL
HGB URINE DIPSTICK: NEGATIVE
Ketones, ur: 15 mg/dL — AB
NITRITE: POSITIVE — AB
Protein, ur: 30 mg/dL — AB
SPECIFIC GRAVITY, URINE: 1.041 — AB (ref 1.005–1.030)
pH: 6 (ref 5.0–8.0)

## 2015-10-21 LAB — COMPREHENSIVE METABOLIC PANEL
ALBUMIN: 4.6 g/dL (ref 3.5–5.0)
ALK PHOS: 47 U/L (ref 38–126)
ALT: 21 U/L (ref 14–54)
ANION GAP: 10 (ref 5–15)
AST: 25 U/L (ref 15–41)
BILIRUBIN TOTAL: 0.5 mg/dL (ref 0.3–1.2)
BUN: 9 mg/dL (ref 6–20)
CALCIUM: 9.2 mg/dL (ref 8.9–10.3)
CO2: 26 mmol/L (ref 22–32)
Chloride: 103 mmol/L (ref 101–111)
Creatinine, Ser: 0.77 mg/dL (ref 0.44–1.00)
GFR calc Af Amer: >60 mL/min (ref >60)
GFR calc non Af Amer: >60 mL/min (ref >60)
GLUCOSE: 83 mg/dL (ref 65–99)
Potassium: 3.1 mmol/L — ABNORMAL LOW (ref 3.5–5.1)
SODIUM: 139 mmol/L (ref 135–145)
TOTAL PROTEIN: 8.5 g/dL — AB (ref 6.5–8.1)

## 2015-10-21 LAB — CBC
HCT: 42.2 % (ref 36.0–46.0)
HEMOGLOBIN: 13.9 g/dL (ref 12.0–15.0)
MCH: 31.1 pg (ref 26.0–34.0)
MCHC: 32.9 g/dL (ref 30.0–36.0)
MCV: 94.4 fL (ref 78.0–100.0)
Platelets: 234 10*3/uL (ref 150–400)
RBC: 4.47 MIL/uL (ref 3.87–5.11)
RDW: 13.9 % (ref 11.5–15.5)
WBC: 7.4 10*3/uL (ref 4.0–10.5)

## 2015-10-21 LAB — URINE MICROSCOPIC-ADD ON

## 2015-10-21 LAB — LIPASE, BLOOD: Lipase: 18 U/L (ref 11–51)

## 2015-10-21 MED ORDER — KETOROLAC TROMETHAMINE 30 MG/ML IJ SOLN: 30.0000 mg | Freq: Once | INTRAMUSCULAR | Status: DC

## 2015-10-21 MED ORDER — KETOROLAC TROMETHAMINE 30 MG/ML IJ SOLN
30.0000 mg | Freq: Once | INTRAMUSCULAR | Status: AC
  Administered 2015-10-21: 30 mg via INTRAVENOUS
  Filled 2015-10-21: qty 1

## 2015-10-21 MED ORDER — CEPHALEXIN 500 MG PO CAPS: 500.0000 mg | ORAL_CAPSULE | Freq: Two times a day (BID) | ORAL | 0 refills | Status: DC

## 2015-10-21 NOTE — Discharge Instructions (Signed)
Please read and follow all provided instructions.  Your diagnoses today include:  1. UTI (lower urinary tract infection)   2. RUQ pain    Tests performed today include: Urine test - suggests that you have an infection in your bladder Vital signs. See below for your results today.   Medications prescribed:  Take as prescribed   Home care instructions:  Follow any educational materials contained in this packet.  Follow-up instructions: Please follow-up with your primary care provider in 3 days if symptoms are not resolved for further evaluation of your symptoms.  Return instructions:  Please return to the Emergency Department if you experience worsening symptoms.  Return with fever, worsening pain, persistent vomiting, worsening pain in your back.  Please return if you have any other emergent concerns.  Additional Information:  Your vital signs today were: BP 95/62 (BP Location: Right Arm)    Pulse 83    Temp 98.7 F (37.1 C) (Oral)    Resp 18    Ht 5\' 5"  (1.651 m)    Wt 87.1 kg    LMP 10/14/2015    SpO2 100%    BMI 31.95 kg/m  If your blood pressure (BP) was elevated above 135/85 this visit, please have this repeated by your doctor within one month. --------------

## 2015-10-21 NOTE — ED Notes (Signed)
US at bedside

## 2015-10-21 NOTE — ED Provider Notes (Signed)
[facility]-EMERGENCY DEPT Provider Note   CSN: [id number] Arrival date & time: 10/21/15  1320  First Provider Contact:  First MD Initiated Contact with Patient 10/21/15 1421     History   Chief Complaint Chief Complaint  Patient presents with  . Abdominal Pain    HPI Alicia Castro is a 30 y.o. female.  HPI 30 y.o. female presents to the Emergency Department today complaining of epigastric abdominal pain x 3 days. No hx same. States pain feels like cramping sensation and has been unrelieved with Prilosec and tylenol. States pain is 8/10 and has been constant for 3 days. Pt notes decrease in PO intake, but is still able to tolerate PO. Mainly lack of appetite. No N/V. No fevers. No CP/SOB. Pt has not had BM x 3 days, but is still passing flatus. No diarrhea. No dysuria. No vaginal bleeding/discharge. No other symptoms noted.    Past Medical History:  Diagnosis Date  . Abnormal Pap smear   . Chlamydia   . Eclampsia   . Gonorrhea   . Preterm labor   . Trichomonas   . UTI (lower urinary tract infection)    pt has h/o Acinetobacter in urine culture-requires Contact Isolation with each hodpitalization  . Vaginal Pap smear, abnormal     Patient Active Problem List   Diagnosis Date Noted  . NVD (normal vaginal delivery) 04/16/2015  . Ruptured, membranes, premature 04/07/2015  . Bleeding 03/28/2015  . Eclampsia, with delivery, with current postpartum complication 05/02/2013  . PRES (posterior reversible encephalopathy syndrome) 04/11/2013  . Eclampsia, postpartum 04/09/2013  . Headache 04/08/2013  . Normal delivery 03/31/2013  . Pregnancy 03/30/2013  . Pyelonephritis 02/24/2013  . History of maternal gonorrhea, currently pregnant 09/23/2012  . Normal intrauterine pregnancy on prenatal ultrasound 09/23/2012    Past Surgical History:  Procedure Laterality Date  . COLPOSCOPY    . WISDOM TOOTH EXTRACTION      OB History    Gravida Para Term Preterm AB Living   6 4 2 2 2 3      SAB TAB Ectopic Multiple Live Births     2   0 3       Home Medications    Prior to Admission medications   Medication Sig Start Date End Date Taking? Authorizing Provider  Iron-FA-B Cmp-C-Biot-Probiotic (FUSION PLUS) CAPS Take 1 capsule by mouth daily before breakfast. 04/18/15   Brock Badharles A [name], MD  oxyCODONE-acetaminophen (PERCOCET/ROXICET) 5-325 MG tablet Take 1-2 tablets by mouth every 4 (four) hours as needed for moderate pain or severe pain (for pain scale greater than or equal to 4 and less than 7). 04/18/15   Brock Badharles A [name], MD    Family History Family History  Problem Relation Age of Onset  . Cancer Maternal Grandmother     Social History Social History  Substance Use Topics  . Smoking status: Current Every Day Smoker    Packs/day: 0.50    Types: Cigarettes  . Smokeless tobacco: Never Used  . Alcohol use Yes     Comment: smoked yesterday, daily     Allergies   Review of patient's allergies indicates no known allergies.   Review of Systems Review of Systems ROS reviewed and all are negative for acute change except as noted in the HPI.    Physical Exam Updated Vital Signs BP 109/73 (BP Location: Left Arm)   Pulse 77   Temp 98.7 F (37.1 C) (Oral)   Resp 16   LMP 10/14/2015   SpO2  100%   Physical Exam  Constitutional: She is oriented to person, place, and time. Vital signs are normal. She appears well-developed and well-nourished.  Pt in NAD. Resting comfortably.   HENT:  Head: Normocephalic.  Right Ear: Hearing normal.  Left Ear: Hearing normal.  Eyes: Conjunctivae and EOM are normal. Pupils are equal, round, and reactive to light.  Neck: Normal range of motion. Neck supple.  Cardiovascular: Normal rate, regular rhythm, normal heart sounds and intact distal pulses.   Pulmonary/Chest: Effort normal and breath sounds normal. No respiratory distress. She has no wheezes. She has no rales. She exhibits no tenderness.  Abdominal: Soft. Bowel sounds  are normal. There is tenderness in the right upper quadrant and epigastric area. There is positive Murphy's sign. There is no rigidity, no rebound, no guarding, no CVA tenderness and no tenderness at McBurney's point.  Abdomen soft  Neurological: She is alert and oriented to person, place, and time.  Skin: Skin is warm and dry.  Psychiatric: She has a normal mood and affect. Her speech is normal and behavior is normal. Thought content normal.   ED Treatments / Results  Labs (all labs ordered are listed, but only abnormal results are displayed) Labs Reviewed  COMPREHENSIVE METABOLIC PANEL - Abnormal; Notable for the following:       Result Value   Potassium 3.1 (*)    Total Protein 8.5 (*)    All other components within normal limits  URINALYSIS, ROUTINE W REFLEX MICROSCOPIC (NOT AT [facility]) - Abnormal; Notable for the following:    Color, Urine AMBER (*)    APPearance CLOUDY (*)    Specific Gravity, Urine 1.041 (*)    Ketones, ur 15 (*)    Protein, ur 30 (*)    Nitrite POSITIVE (*)    Leukocytes, UA SMALL (*)    All other components within normal limits  URINE MICROSCOPIC-ADD ON - Abnormal; Notable for the following:    Squamous Epithelial / LPF 0-5 (*)    Bacteria, UA MANY (*)    All other components within normal limits  LIPASE, BLOOD  CBC   EKG  EKG Interpretation None      Radiology US Abdomen Limited Ruq  Result Date: 10/21/2015 CLINICAL DATA:  30 year old female with right upper quadrant pain for the past 3 days. Initial encounter. EXAM: US ABDOMEN LIMITED - RIGHT UPPER QUADRANT COMPARISON:  05/22/2014 CT abdomen and pelvis. 02/03/2014 abdominal sonogram. FINDINGS: Gallbladder: No gallstones or wall thickening visualized. No sonographic Murphy sign noted by sonographer. Common bile duct: Diameter: 2.7 mm. Liver: Left lobe liver 1 x 0.8 x 0.8 cm echogenic structure most suggestive of a hemangioma previously measuring 0.8 x 0.6 x 0.8 cm (02/03/2014). IMPRESSION: No evidence of  gallstones or gallbladder wall thickening. Probable hemangioma left lobe liver minimally changed from 2015 exam as noted above. Electronically Signed   By: Lacy Duverney M.D.   On: 10/21/2015 16:07    Procedures Procedures (including critical care time)  Medications Ordered in ED Medications - No data to display   Initial Impression / Assessment and Plan / ED Course  I have reviewed the triage vital signs and the nursing notes.  Pertinent labs & imaging results that were available during my care of the patient were reviewed by me and considered in my medical decision making (see chart for details).  Clinical Course   Final Clinical Impressions(s) / ED Diagnoses  I have reviewed and evaluated the relevant laboratory values I have reviewed and evaluated  the relevant imaging studies.  I have reviewed the relevant previous healthcare records. I obtained HPI from historian.  ED Course:  Assessment: Pt is a 30yF who presents with epigastric abdominal pain with radiation to suprapubic region. No dysuria. Able to tolerate PO. On exam, pt in NAD. Nontoxic/nonseptic appearing. VSS. Afebrile. Lungs CTA. Heart RRR. Abdomen nontender soft. Mild discomfort in RUQ. Labs unremarkable. Lipase negative. RUQ US unremarkable for cholecystitis. UA showed UTI. Given toradol in ED. Plan is to DC home with ABX for UTI and follow up to PCP. Pain improved with medication in ED. At time of discharge, Patient is in no acute distress. Vital Signs are stable. Patient is able to ambulate. Patient able to tolerate PO.    Disposition/Plan:  DC Home Additional Verbal discharge instructions given and discussed with patient.  Pt Instructed to f/u with PCP in the next week for evaluation and treatment of symptoms. Return precautions given Pt acknowledges and agrees with plan  Supervising Physician Geoffery Lyons, MD   Final diagnoses:  RUQ pain  UTI (lower urinary tract infection)    New Prescriptions New  Prescriptions   No medications on file     Audry Pili, PA-C 10/21/15 1629    Geoffery Lyons, MD 10/21/15 [phone]

## 2015-10-21 NOTE — ED Triage Notes (Signed)
Pt reports epigastric pain x 3 days.  Reports cramping pain.  Denies any n/v at this time.

## 2015-10-21 NOTE — ED Notes (Signed)
PT DISCHARGED. INSTRUCTIONS AND PRESCRIPTION GIVEN. AAOX4. PT IN NO APPARENT DISTRESS. THE OPPORTUNITY TO ASK QUESTIONS WAS PROVIDED. 

## 2015-10-24 LAB — URINE CULTURE

## 2015-10-25 ENCOUNTER — Telehealth (HOSPITAL_BASED_OUTPATIENT_CLINIC_OR_DEPARTMENT_OTHER): Payer: Self-pay

## 2015-10-25 NOTE — Telephone Encounter (Signed)
Post ED Visit - Positive Culture Follow-up  Culture report reviewed by antimicrobial stewardship pharmacist:  []  Alicia Castro, Pharm.Castro. []  Alicia Castro, Pharm.Castro., BCPS []  Alicia Castro, Pharm.Castro. []  Alicia Castro, Pharm.Castro., BCPS []  Alicia Castro, Alicia Rainbow BoulevardPharm.Castro., BCPS, AAHIVP []  Alicia Castro, Pharm.Castro., BCPS, AAHIVP []  Alicia Castro, Pharm.Castro. []  Alicia Castro, Alicia Castro Positive urine culture Treated with Cephalexin, organism sensitive to the same and no further patient follow-up is required at this time.  Alicia Castro, Alicia Castro 10/25/2015, 9:28 AM

## 2015-12-08 ENCOUNTER — Encounter: Payer: Self-pay | Admitting: *Deleted

## 2016-03-14 NOTE — L&D Delivery Note (Signed)
Shortly after 1900 pt began having ctx that were slightly more uncomfortable; at approx 2130 she was examined and was noted to have dilated to 4+cm and [facility] Shortly after arrival she began to have an urge to push.  Delivery Note At 10:12 PM a viable female was delivered via Vaginal, Spontaneous Delivery (Presentation: LOA;  ).  APGAR:to be recorded , ; weight pending  .   Placenta status: intact , sent to path .  Cord: 3 vessel  with the following complications: none.    Anesthesia:  none Episiotomy: None Lacerations:  none  Est. Blood Loss (mL):  200  Mom to postpartum.  Baby to NICU for preterm evaluation and management.  She pushed with good maternal effort to deliver a viable female infant in cephalic, LOA position. nuchal cord present, easily reduced. Baby delivered without difficulty was noted to have good tone and place on maternal abdomen for oral suctioning, drying and stimulation. Delayed cord clamping performed. Placenta delivered spontaneously with gentle cord traction. Fundus firm with massage and Pitocin. Perineum inspected and found to have no lacerations,  Counts of sharps, instruments, and lap pads were all correct.   Ignacia MarvelKendrick C White 10/21/2016, 10:28 PM  Patient is a [id number] at 622w1d who was admitted on 8/4 with PPROM, significant hx of hx pre e, PTD, and stillbirth. She rec'd BMZ and latency abx.  She progressed without augmentation via.  I was gloved and present for delivery in its entirety.  Second stage of labor progressed, baby delivered after two contractions.  Mild decels during second stage noted.  Complications: none  Lacerations: none  EBL: 200cc  Cord clamped and cut after 1min and infant passed to awaiting peds team at [facility] for eval.  Cam HaiSHAW, Samarie Pinder, CNM 10:43 PM 10/21/2016

## 2016-06-28 ENCOUNTER — Encounter (HOSPITAL_COMMUNITY): Payer: Self-pay | Admitting: *Deleted

## 2016-06-28 ENCOUNTER — Inpatient Hospital Stay (HOSPITAL_COMMUNITY)
Admission: AD | Admit: 2016-06-28 | Discharge: 2016-06-28 | Disposition: A | Discharge status: Home / Self Care | Payer: Medicaid Other | Source: Ambulatory Visit | Attending: Family Medicine | Admitting: Family Medicine

## 2016-06-28 DIAGNOSIS — F17210 Nicotine dependence, cigarettes, uncomplicated: Secondary | ICD-10-CM | POA: Insufficient documentation

## 2016-06-28 DIAGNOSIS — A5901 Trichomonal vulvovaginitis: Secondary | ICD-10-CM | POA: Diagnosis not present

## 2016-06-28 DIAGNOSIS — M549 Dorsalgia, unspecified: Secondary | ICD-10-CM

## 2016-06-28 DIAGNOSIS — G44209 Tension-type headache, unspecified, not intractable: Secondary | ICD-10-CM | POA: Diagnosis not present

## 2016-06-28 DIAGNOSIS — O99332 Smoking (tobacco) complicating pregnancy, second trimester: Secondary | ICD-10-CM | POA: Diagnosis not present

## 2016-06-28 DIAGNOSIS — O9989 Other specified diseases and conditions complicating pregnancy, childbirth and the puerperium: Secondary | ICD-10-CM

## 2016-06-28 DIAGNOSIS — N949 Unspecified condition associated with female genital organs and menstrual cycle: Secondary | ICD-10-CM | POA: Diagnosis not present

## 2016-06-28 DIAGNOSIS — O99891 Other specified diseases and conditions complicating pregnancy: Secondary | ICD-10-CM

## 2016-06-28 DIAGNOSIS — R109 Unspecified abdominal pain: Secondary | ICD-10-CM | POA: Diagnosis present

## 2016-06-28 DIAGNOSIS — Z3A20 20 weeks gestation of pregnancy: Secondary | ICD-10-CM | POA: Insufficient documentation

## 2016-06-28 DIAGNOSIS — O0932 Supervision of pregnancy with insufficient antenatal care, second trimester: Secondary | ICD-10-CM

## 2016-06-28 DIAGNOSIS — A599 Trichomoniasis, unspecified: Secondary | ICD-10-CM

## 2016-06-28 DIAGNOSIS — O98312 Other infections with a predominantly sexual mode of transmission complicating pregnancy, second trimester: Secondary | ICD-10-CM | POA: Diagnosis not present

## 2016-06-28 LAB — WET PREP, GENITAL
Clue Cells Wet Prep HPF POC: NONE SEEN
YEAST WET PREP: NONE SEEN

## 2016-06-28 LAB — URINALYSIS, ROUTINE W REFLEX MICROSCOPIC
Glucose, UA: NEGATIVE mg/dL
Hgb urine dipstick: NEGATIVE
KETONES UR: 20 mg/dL — AB
NITRITE: NEGATIVE
PROTEIN: 30 mg/dL — AB
Specific Gravity, Urine: 1.026 (ref 1.005–1.030)
pH: 6 (ref 5.0–8.0)

## 2016-06-28 LAB — POCT PREGNANCY, URINE: PREG TEST UR: POSITIVE — AB

## 2016-06-28 MED ORDER — PRENATAL COMPLETE 14-0.4 MG PO TABS: 1.0000 | ORAL_TABLET | Freq: Every day | ORAL | 6 refills | Status: DC

## 2016-06-28 MED ORDER — CYCLOBENZAPRINE HCL 10 MG PO TABS: 10.0000 mg | ORAL_TABLET | Freq: Two times a day (BID) | ORAL | 0 refills | Status: DC | PRN

## 2016-06-28 MED ORDER — METRONIDAZOLE 500 MG PO TABS
2000.0000 mg | ORAL_TABLET | Freq: Once | ORAL | Status: AC
  Administered 2016-06-28: 2000 mg via ORAL
  Filled 2016-06-28: qty 4

## 2016-06-28 NOTE — MAU Note (Addendum)
Is pregnant, needs to see how far along she is. Has been having headaches, had one everyday this week, thinks her BP is high, has been taking Ibuprofen, not really helping.  Also having some back pian, thinks she might have a UTI.  Having frequency and urgency, no pain with urination. +HPT early March

## 2016-06-28 NOTE — MAU Note (Signed)
Pt states she has had a home positive pregnancy test, headaches, backaches, and white discharge with odor.

## 2016-06-28 NOTE — MAU Provider Note (Signed)
History     CSN: [id number]  Arrival date and time: 06/28/16 1146   First Provider Initiated Contact with Patient 06/28/16 1224      Chief Complaint  Patient presents with  . Possible Pregnancy  . Back Pain  . Headache   HPI Alicia Castro is a 31 y.o. [phone] at unknown [place] who presents to MAU today with complaint of abdominal pain, back pain, vaginal discharge and headaches. The patient states LMP in late  November and +HPT last month. She states headache daily x 1 week. She has tried Ibuprofen and Tylenol with some relief. She is concerned because even though she had chronic headaches, she had PP eclampsia with her last pregnancy. She denies vaginal bleeding, fever or dysuria. She states back and abdominal pain or mild to moderate and worse with ambulation. She has noted a slight discharge with odor. She reports fetal movement.  OB History    Gravida Para Term Preterm AB Living   SAB TAB Ectopic Multiple Live Births     2   0 3      Past Medical History:  Diagnosis Date  . Abnormal Pap smear   . Chlamydia   . Eclampsia   . Gonorrhea   . Preterm labor   . Trichomonas   . UTI (lower urinary tract infection)    pt has h/o Acinetobacter in urine culture-requires Contact Isolation with each hodpitalization  . Vaginal Pap smear, abnormal     Past Surgical History:  Procedure Laterality Date  . COLPOSCOPY    . WISDOM TOOTH EXTRACTION      Family History  Problem Relation Age of Onset  . Cancer Maternal Grandmother     Social History  Substance Use Topics  . Smoking status: Current Every Day Smoker    Packs/day: 0.50    Types: Cigarettes  . Smokeless tobacco: Never Used  . Alcohol use Yes     Comment: smoked yesterday, daily    Allergies: No Known Allergies  No prescriptions prior to admission.    Review of Systems  Constitutional: Negative for fever.  Gastrointestinal: Positive for abdominal pain. Negative for constipation,  diarrhea, nausea and vomiting.  Genitourinary: Positive for frequency and vaginal discharge. Negative for dysuria, flank pain, urgency and vaginal bleeding.  Musculoskeletal: Positive for back pain.   Physical Exam   Blood pressure 110/75, pulse 87, temperature 98 F (36.7 C), temperature source Oral, resp. rate 18, height  (1.626 m), weight 193 lb 12 oz (87.9 kg), last menstrual period 02/06/2016, SpO2 100 %, unknown if currently breastfeeding.  Physical Exam  Nursing note and vitals reviewed. Constitutional: She is oriented to person, place, and time. She appears well-developed and well-nourished. No distress.  HENT:  Head: Normocephalic and atraumatic.  Cardiovascular: Normal rate.   Respiratory: Effort normal.  GI: Soft. She exhibits no distension and no mass. There is no tenderness. There is no rebound and no guarding.  Genitourinary: Uterus is enlarged. Uterus is not tender. Cervix exhibits no motion tenderness and no discharge. No bleeding in the vagina. Vaginal discharge (small amount of thin, white discharge) found.  Neurological: She is alert and oriented to person, place, and time.  Skin: Skin is warm and dry. No erythema.  Psychiatric: She has a normal mood and affect.  Dilation: Closed Effacement (%): Thick Exam by:: Harlon Flor PA   Results for orders placed or performed during the hospital encounter of 06/28/16 (from  the past 24 hour(s))  Urinalysis, Routine w reflex microscopic     Status: Abnormal   Collection Time: 06/28/16 12:05 PM  Result Value Ref Range   Color, Urine AMBER (A) YELLOW   APPearance HAZY (A) CLEAR   Specific Gravity, Urine 1.026 1.005 - 1.030   pH 6.0 5.0 - 8.0   Glucose, UA NEGATIVE NEGATIVE mg/dL   Hgb urine dipstick NEGATIVE NEGATIVE   Bilirubin Urine SMALL (A) NEGATIVE   Ketones, ur 20 (A) NEGATIVE mg/dL   Protein, ur 30 (A) NEGATIVE mg/dL   Nitrite NEGATIVE NEGATIVE   Leukocytes, UA TRACE (A) NEGATIVE   RBC / HPF 0-5 0 - 5 RBC/hpf    WBC, UA 6-30 0 - 5 WBC/hpf   Bacteria, UA RARE (A) NONE SEEN   Squamous Epithelial / LPF 0-5 (A) NONE SEEN   Mucous PRESENT   Pregnancy, urine POC     Status: Abnormal   Collection Time: 06/28/16 12:23 PM  Result Value Ref Range   Preg Test, Ur POSITIVE (A) NEGATIVE  Wet prep, genital     Status: Abnormal   Collection Time: 06/28/16  1:06 PM  Result Value Ref Range   Yeast Wet Prep HPF POC NONE SEEN NONE SEEN   Trich, Wet Prep PRESENT (A) NONE SEEN   Clue Cells Wet Prep HPF POC NONE SEEN NONE SEEN   WBC, Wet Prep HPF POC FEW (A) NONE SEEN   Sperm NOT DONE      MAU Course  Procedures None  MDM +UPT FHR - 156 with doppler, difficult to obtain UA and wet prep today  Urine culture ordered 2 G Flagyl given for trichomonas Bedside US:  Pt informed that the ultrasound is considered a limited OB ultrasound and is not intended to be a complete ultrasound exam.  Patient also informed that the ultrasound is not being completed with the intent of assessing for fetal or placental anomalies or any pelvic abnormalities.  Explained that the purpose of today's ultrasound is to assess for  viability.  Patient acknowledges the purpose of the exam and the limitations of the study.    Assessment and Plan  A: [facility] at [redacted]w[redacted]d by unsure LMP Round ligament pain Back pain in pregnancy, third trimester Trichomonas  Headache, tension type   P: Discharge home Rx for Prenatal vitamins and Flexeril given to patient  Second trimester precautions discussed Outpatient [place] for anatomy and to confirm dating ordered. They will call the patient with an appointment  Patient treated for trichomonas in MAU. Partner treatment and abstinence for 2 weeks advised Patient advised to follow-up with [facility] to start prenatal care. They will call her with an appointment  Patient may return to MAU as needed or if her condition were to change or worsen  Marny Lowenstein, PA-C  06/28/2016, 2:34 PM

## 2016-06-28 NOTE — Discharge Instructions (Signed)
Trichomoniasis Trichomoniasis is an STI (sexually transmitted infection) that can affect both women and men. In women, the outer area of the female genitalia (vulva) and the vagina are affected. In men, the penis is mainly affected, but the prostate and other reproductive organs can also be involved. This condition can be treated with medicine. It often has no symptoms (is asymptomatic), especially in men. What are the causes? This condition is caused by an organism called Trichomonas vaginalis. Trichomoniasis most often spreads from person to person (is contagious) through sexual contact. What increases the risk? The following factors may make you more likely to develop this condition:  Having unprotected sexual intercourse.  Having sexual intercourse with a partner who has trichomoniasis.  Having multiple sexual partners.  Having had previous trichomoniasis infections or other STIs. What are the signs or symptoms? In women, symptoms of trichomoniasis include:  Abnormal vaginal discharge that is clear, white, gray, or yellow-green and foamy and has an unusual "fishy" odor.  Itching and irritation of the vagina and vulva.  Burning or pain during urination or sexual intercourse.  Genital redness and swelling. In men, symptoms of trichomoniasis include:  Penile discharge that may be foamy or contain pus.  Pain in the penis. This may happen only when urinating.  Itching or irritation inside the penis.  Burning after urination or ejaculation. How is this diagnosed? In women, this condition may be found during a routine Pap test or physical exam. It may be found in men during a routine physical exam. Your health care provider may perform tests to help diagnose this infection, such as:  Urine tests (men and women).  The following in women:  Testing the pH of the vagina.  A vaginal swab test that checks for the Trichomonas vaginalis organism.  Testing vaginal secretions. Your  health care provider may test you for other STIs, including HIV (human immunodeficiency virus). How is this treated? This condition is treated with medicine taken by mouth (orally), such as metronidazole or tinidazole to fight the infection. Your sexual partner(s) may also need to be tested and treated.  If you are a woman and you plan to become pregnant or think you may be pregnant, tell your health care provider right away. Some medicines that are used to treat the infection should not be taken during pregnancy. Your health care provider may recommend over-the-counter medicines or creams to help relieve itching or irritation. You may be tested for infection again 3 months after treatment. Follow these instructions at home:  Take and use over-the-counter and prescription medicines, including creams, only as told by your health care provider.  Do not have sexual intercourse until one week after you finish your medicine, or until your health care provider approves. Ask your health care provider when you may resume sexual intercourse.  (Women) Do not douche or wear tampons while you have the infection.  Discuss your infection with your sexual partner(s). Make sure that your partner gets tested and treated, if necessary.  Keep all follow-up visits as told by your health care provider. This is important. How is this prevented?  Use condoms every time you have sex. Using condoms correctly and consistently can help protect against STIs.  Avoid having multiple sexual partners.  Talk with your sexual partner about any symptoms that either of you may have, as well as any history of STIs.  Get tested for STIs and STDs (sexually transmitted diseases) before you have sex. Ask your partner to do the same.  Do not have sexual contact if you have symptoms of trichomoniasis or another STI. Contact a health care provider if:  You still have symptoms after you finish your medicine.  You develop pain in  your abdomen.  You have pain when you urinate.  You have bleeding after sexual intercourse.  You develop a rash.  You feel nauseous or you vomit.  You plan to become pregnant or think you may be pregnant. Summary  Trichomoniasis is an STI (sexually transmitted infection) that can affect both women and men.  This condition often has no symptoms (is asymptomatic), especially in men.  You should not have sexual intercourse until one week after you finish your medicine, or until your health care provider approves. Ask your health care provider when you may resume sexual intercourse.  Discuss your infection with your sexual partner. Make sure that your partner gets tested and treated, if necessary. This information is not intended to replace advice given to you by your health care provider. Make sure you discuss any questions you have with your health care provider. Document Released: 08/24/2000 Document Revised: 01/22/2016 Document Reviewed: 01/22/2016 Elsevier Interactive Patient Education  2017 [employer] Back Pain in Pregnancy Back pain during pregnancy is common. Back pain may be caused by several factors that are related to changes during your pregnancy. Follow these instructions at home: Managing pain, stiffness, and swelling   If directed, apply ice for sudden (acute) back pain.  Put ice in a plastic bag.  Place a towel between your skin and the bag.  Leave the ice on for 20 minutes, 2-3 times per day.  If directed, apply heat to the affected area before you exercise:  Place a towel between your skin and the heat pack or heating pad.  Leave the heat on for 20-30 minutes.  Remove the heat if your skin turns bright red. This is especially important if you are unable to feel pain, heat, or cold. You may have a greater risk of getting burned. Activity   Exercise as told by your health care provider. Exercising is the best way to prevent or manage back pain.  Listen  to your body when lifting. If lifting hurts, ask for help or bend your knees. This uses your leg muscles instead of your back muscles.  Squat down when picking up something from the floor. Do not bend over.  Only use bed rest as told by your health care provider. Bed rest should only be used for the most severe episodes of back pain. Standing, Sitting, and Lying Down   Do not stand in one place for long periods of time.  Use good posture when sitting. Make sure your head rests over your shoulders and is not hanging forward. Use a pillow on your lower back if necessary.  Try sleeping on your side, preferably the left side, with a pillow or two between your legs. If you are sore after a night's rest, your bed may be too soft. A firm mattress may provide more support for your back during pregnancy. General instructions   Do not wear high heels.  Eat a healthy diet. Try to gain weight within your health care provider's recommendations.  Use a maternity girdle, elastic sling, or back brace as told by your health care provider.  Take over-the-counter and prescription medicines only as told by your health care provider.  Keep all follow-up visits as told by your health care provider. This is important. This includes any visits with  any specialists, such as a physical therapist. Contact a health care provider if:  Your back pain interferes with your daily activities.  You have increasing pain in other parts of your body. Get help right away if:  You develop numbness, tingling, weakness, or problems with the use of your arms or legs.  You develop severe back pain that is not controlled with medicine.  You have a sudden change in bowel or bladder control.  You develop shortness of breath, dizziness, or you faint.  You develop nausea, vomiting, or sweating.  You have back pain that is a rhythmic, cramping pain similar to labor pains. Labor pain is usually 1-2 minutes apart, lasts for  about 1 minute, and involves a bearing down feeling or pressure in your pelvis.  You have back pain and your water breaks or you have vaginal bleeding.  You have back pain or numbness that travels down your leg.  Your back pain developed after you fell.  You develop pain on one side of your back.  You see blood in your urine.  You develop skin blisters in the area of your back pain. This information is not intended to replace advice given to you by your health care provider. Make sure you discuss any questions you have with your health care provider. Document Released: 06/08/2005 Document Revised: 08/06/2015 Document Reviewed: 11/12/2014 Elsevier Interactive Patient Education  2017 [employer].

## 2016-06-29 LAB — CULTURE, OB URINE

## 2016-07-14 ENCOUNTER — Encounter: Payer: Self-pay | Admitting: Medical

## 2016-07-14 ENCOUNTER — Other Ambulatory Visit (HOSPITAL_COMMUNITY): Payer: Self-pay | Admitting: Medical

## 2016-07-14 ENCOUNTER — Ambulatory Visit (HOSPITAL_COMMUNITY)
Admission: RE | Admit: 2016-07-14 | Discharge: 2016-07-14 | Disposition: A | Discharge status: Home / Self Care | Payer: Medicaid Other | Source: Ambulatory Visit | Attending: Medical | Admitting: Medical

## 2016-07-14 ENCOUNTER — Encounter: Payer: Medicaid Other | Admitting: Obstetrics & Gynecology

## 2016-07-14 DIAGNOSIS — O0990 Supervision of high risk pregnancy, unspecified, unspecified trimester: Secondary | ICD-10-CM | POA: Insufficient documentation

## 2016-07-14 DIAGNOSIS — O0932 Supervision of pregnancy with insufficient antenatal care, second trimester: Secondary | ICD-10-CM | POA: Insufficient documentation

## 2016-07-14 DIAGNOSIS — Z3A19 19 weeks gestation of pregnancy: Secondary | ICD-10-CM | POA: Insufficient documentation

## 2016-07-15 ENCOUNTER — Telehealth: Payer: Self-pay | Admitting: General Practice

## 2016-07-15 ENCOUNTER — Encounter: Payer: Medicaid Other | Admitting: Obstetrics & Gynecology

## 2016-07-20 ENCOUNTER — Inpatient Hospital Stay (HOSPITAL_COMMUNITY)
Admission: AD | Admit: 2016-07-20 | Discharge: 2016-07-20 | Disposition: A | Discharge status: Home / Self Care | Payer: Medicaid Other | Source: Ambulatory Visit | Attending: Family Medicine | Admitting: Family Medicine

## 2016-07-20 ENCOUNTER — Encounter (HOSPITAL_COMMUNITY): Payer: Self-pay

## 2016-07-20 DIAGNOSIS — O26892 Other specified pregnancy related conditions, second trimester: Secondary | ICD-10-CM

## 2016-07-20 DIAGNOSIS — O23592 Infection of other part of genital tract in pregnancy, second trimester: Secondary | ICD-10-CM | POA: Insufficient documentation

## 2016-07-20 DIAGNOSIS — F17210 Nicotine dependence, cigarettes, uncomplicated: Secondary | ICD-10-CM | POA: Diagnosis not present

## 2016-07-20 DIAGNOSIS — R1030 Lower abdominal pain, unspecified: Secondary | ICD-10-CM | POA: Diagnosis not present

## 2016-07-20 DIAGNOSIS — Z3A23 23 weeks gestation of pregnancy: Secondary | ICD-10-CM | POA: Diagnosis not present

## 2016-07-20 DIAGNOSIS — M545 Low back pain: Secondary | ICD-10-CM | POA: Diagnosis present

## 2016-07-20 DIAGNOSIS — R109 Unspecified abdominal pain: Secondary | ICD-10-CM

## 2016-07-20 DIAGNOSIS — Z87440 Personal history of urinary (tract) infections: Secondary | ICD-10-CM | POA: Insufficient documentation

## 2016-07-20 DIAGNOSIS — O99332 Smoking (tobacco) complicating pregnancy, second trimester: Secondary | ICD-10-CM | POA: Insufficient documentation

## 2016-07-20 DIAGNOSIS — B9689 Other specified bacterial agents as the cause of diseases classified elsewhere: Secondary | ICD-10-CM | POA: Diagnosis not present

## 2016-07-20 DIAGNOSIS — N760 Acute vaginitis: Secondary | ICD-10-CM | POA: Diagnosis not present

## 2016-07-20 LAB — URINALYSIS, ROUTINE W REFLEX MICROSCOPIC
Bilirubin Urine: NEGATIVE
Glucose, UA: NEGATIVE mg/dL
Hgb urine dipstick: NEGATIVE
Ketones, ur: NEGATIVE mg/dL
LEUKOCYTES UA: NEGATIVE
NITRITE: NEGATIVE
PROTEIN: NEGATIVE mg/dL
Specific Gravity, Urine: <1.005 — ABNORMAL LOW (ref 1.005–1.030)
pH: 6 (ref 5.0–8.0)

## 2016-07-20 LAB — WET PREP, GENITAL
Sperm: NONE SEEN
Trich, Wet Prep: NONE SEEN
YEAST WET PREP: NONE SEEN

## 2016-07-20 LAB — GC/CHLAMYDIA PROBE AMP (~~LOC~~) NOT AT ARMC
Chlamydia: NEGATIVE
Neisseria Gonorrhea: NEGATIVE

## 2016-07-20 MED ORDER — METRONIDAZOLE 500 MG PO TABS: 500.0000 mg | ORAL_TABLET | Freq: Two times a day (BID) | ORAL | 0 refills | Status: DC

## 2016-07-20 NOTE — MAU Provider Note (Signed)
Chief Complaint: Back Pain and Abdominal Pain   First Provider Initiated Contact with Patient 07/20/16 0735        SUBJECTIVE HPI: Alicia Castro is a 31 y.o. [id number] at [redacted]w[redacted]d by LMP who presents to maternity admissions reporting lower abdominal pain, low back pain and urinary frequency.  . She denies vaginal bleeding, vaginal itching/burning, urinary symptoms, h/a, dizziness, n/v, or fever/chills.    Had Trich a month ago and thinks partner may not have taken his meds, and/or may be still seeing his other partner.  Wants to be retested  Has new OB appointment tomorrow in clinic [facility]  Back Pain  This is a new problem. The current episode started in the past 7 days. The problem occurs constantly. The problem is unchanged. The pain is present in the lumbar spine. The quality of the pain is described as aching and cramping. The pain does not radiate. The pain is moderate. The pain is the same all the time. Stiffness is present all day. Associated symptoms include abdominal pain and pelvic pain. Pertinent negatives include no dysuria, fever, leg pain, paresis, paresthesias or weakness.  Abdominal Pain  This is a new problem. The current episode started in the past 7 days. The onset quality is gradual. The problem occurs constantly. The problem has been unchanged. The pain is located in the suprapubic region, LLQ and RLQ. The pain is moderate. The quality of the pain is aching and cramping. The abdominal pain radiates to the back. Pertinent negatives include no dysuria or fever. Nothing aggravates the pain. The pain is relieved by nothing. She has tried nothing for the symptoms.    Past Medical History:  Diagnosis Date  . Abnormal Pap smear   . Chlamydia   . Eclampsia   . Gonorrhea   . Preterm labor   . Trichomonas   . UTI (lower urinary tract infection)    pt has h/o Acinetobacter in urine culture-requires Contact Isolation with each hodpitalization  . Vaginal Pap smear, abnormal     Past Surgical History:  Procedure Laterality Date  . COLPOSCOPY    . WISDOM TOOTH EXTRACTION     Social History   Social History  . Marital status: Single    Spouse name: N/A  . Number of children: N/A  . Years of education: N/A   Occupational History  . Not on file.   Social History Main Topics  . Smoking status: Current Every Day Smoker    Packs/day: 0.50    Types: Cigarettes  . Smokeless tobacco: Never Used  . Alcohol use Yes     Comment: smoked yesterday, daily  . Drug use: Yes    Frequency: 7.0 times per week    Types: Marijuana     Comment: ocassionally   . Sexual activity: Yes     Comment: Sometimes   Other Topics Concern  . Not on file   Social History Narrative  . No narrative on file   No current facility-administered medications on file prior to encounter.    Current Outpatient Prescriptions on File Prior to Encounter  Medication Sig Dispense Refill  . cyclobenzaprine (FLEXERIL) 10 MG tablet Take 1 tablet (10 mg total) by mouth 2 (two) times daily as needed for muscle spasms. 20 tablet 0  . Prenatal Vit-Fe Fumarate-FA (PRENATAL COMPLETE) 14-0.4 MG TABS Take 1 tablet by mouth daily. 60 each 6   No Known Allergies  I have reviewed patient's Past Medical Hx, Surgical Hx, Family Hx, Social Hx,  medications and allergies.   ROS:  Review of Systems  Constitutional: Negative for fever.  Gastrointestinal: Positive for abdominal pain.  Genitourinary: Positive for pelvic pain. Negative for dysuria.  Musculoskeletal: Positive for back pain.  Neurological: Negative for weakness and paresthesias.   Review of Systems  Other systems negative   Physical Exam  Physical Exam Patient Vitals for the past 24 hrs:  Temp Temp src Resp Height Weight  07/20/16 0720 98.6 F (37 C) Oral 18 - -  07/20/16 0654 - - - 5' 5.5" (1.664 m) 205 lb (93 kg)   Constitutional: Well-developed, well-nourished female in no acute distress.  Cardiovascular: normal  rate Respiratory: normal effort GI: Abd soft, mildly tender throughout. Pos BS x 4 MS: Extremities nontender, no edema, normal ROM Neurologic: Alert and oriented x 4.  GU: Neg CVAT.  PELVIC EXAM: Cervix pink, visually closed, without lesion, scant white creamy discharge, vaginal walls and external genitalia normal Bimanual exam: Cervix 0/long/high, firm, anterior, neg CMT, uterus nontender, nonenlarged, adnexa without tenderness, enlargement, or mass  FHT 160 by doppler  LAB RESULTS UA and wet prep pending     IMAGING [place] Mfm Ob Detail +14 Wk  Result Date: 07/14/2016 ----------------------------------------------------------------------  OBSTETRICS REPORT                      (Signed Final 07/14/2016 12:58 pm) ---------------------------------------------------------------------- Patient Info  ID #:       [id number]                         D.O.B.:   06/12/1985 (31 yrs)  Name:       Alicia Castro                       Visit Date:  07/14/2016 11:01 am              [place] ---------------------------------------------------------------------- Performed By  Performed By:     Tomma Lightning             Ref. Address:     [address] [place],                                                             [place] [address]  Attending:        Clarene Critchley Whitecar        Location:         [facility]                    MD  Referred By:      Marny Lowenstein                    [place] ---------------------------------------------------------------------- Orders   #  Description  Code   1  [place] MFM OB DETAIL +14 WK                     [id number]  ----------------------------------------------------------------------   #  Ordered By               Order #        Accession #    Episode #   1  Vonzella Nipple             [id number]      [id number]     [id number]   ---------------------------------------------------------------------- Indications   [redacted] weeks gestation of pregnancy                Z3A.19   Encounter for fetal anatomic survey            Z36.89   Poor obstetric history (prior pre-term labor   O09.219   x2) (36w and 34w)   Obesity complicating pregnancy                 O99.210 E66.9   Smoking complicating pregnancy, second         Q65.784   trimester   Poor obstetric history: Previous               O09.299   preeclampsia / eclampsia/gestational HTN   Poor obstetric history: Previous IUFD          O09.299   (stillbirth)  ---------------------------------------------------------------------- OB History  Blood Type:            Height:  5'4"   Weight (lb):  193      BMI:   33.12  Gravidity:    7         Term:   2        Prem:   2  TOP:          2        Living:  3 ---------------------------------------------------------------------- Fetal Evaluation  Num Of Fetuses:     1  Fetal Heart         155  Rate(bpm):  Cardiac Activity:   Observed  Presentation:       Cephalic  Placenta:           Posterior, above cervical os  P. Cord Insertion:  Visualized  Amniotic Fluid  AFI FV:      Subjectively within normal limits                              Largest Pocket(cm)                              4.07 ---------------------------------------------------------------------- Biometry  BPD:        42  mm     G. Age:  18w 5d         38  %    CI:        70.78   %   70 - 86                                                          FL/HC:      18.8   %  16.1 - 18.3  HC:      159.1  mm     G. Age:  18w 6d         31  %    HC/AC:      1.19       1.09 - 1.39  AC:      133.3  mm     G. Age:  18w 6d         40  %    FL/BPD:     71.2   %  FL:       29.9  mm     G. Age:  19w 2d         51  %    FL/AC:      22.4   %   20 - 24  HUM:      29.7  mm     G. Age:  19w 5d         69  %  CER:      18.6  mm     G. Age:  18w 2d         29  %  NFT:       3.9  mm  CM:        5.1  mm  Est. FW:     267  gm       0 lb 9 oz     44  % ---------------------------------------------------------------------- Gestational Age  LMP:           22w 5d       Date:   02/06/16                 EDD:   11/12/16  U/S Today:     19w 0d                                        EDD:   12/08/16  Best:          19w 0d    Det. By:   U/S (07/14/16)           EDD:   12/08/16 ---------------------------------------------------------------------- Anatomy  Cranium:               Appears normal         Aortic Arch:            Appears normal  Cavum:                 Appears normal         Ductal Arch:            Appears normal  Ventricles:            Appears normal         Diaphragm:              Appears normal  Choroid Plexus:        Appears normal         Stomach:                Appears normal, left  sided  Cerebellum:            Appears normal         Abdomen:                Appears normal  Posterior Fossa:       Appears normal         Abdominal Wall:         Appears nml (cord                                                                        insert, abd wall)  Nuchal Fold:           Appears normal         Cord Vessels:           Appears normal (3                                                                        vessel cord)  Face:                  Appears normal         Kidneys:                Appear normal                         (orbits and profile)  Lips:                  Appears normal         Bladder:                Appears normal  Thoracic:              Appears normal         Spine:                  Appears normal  Heart:                 Appears normal         Upper Extremities:      Appears normal                         (4CH, axis, and situs  RVOT:                  Appears normal         Lower Extremities:      Appears normal  LVOT:                  Appears normal  Other:  Fetus appears to be a female.  Heels and 5th digit visualized.          Technically difficult  due to maternal habitus and fetal position. ---------------------------------------------------------------------- Cervix Uterus Adnexa  Cervix  Length:  3.32  cm.  Normal appearance by transabdominal scan.  Uterus  No abnormality visualized.  Left Ovary  Not visualized.  Right Ovary  Not visualized. ---------------------------------------------------------------------- Impression  Single IUP at 19w 0d by ultrasound today  Normal fetal anatomic survey  Posterior placenta without previa  Normal amniotic fluid volume ---------------------------------------------------------------------- Recommendations  Recommend ultrasound for growth in the third trimester due  to previous history if IUFD. ----------------------------------------------------------------------                Candis Shine, MD Electronically Signed Final Report   07/14/2016 12:58 pm ----------------------------------------------------------------------   MAU Management/MDM: Wet prep and cultures obtained UA pending Differential diagnosis includes STD and UTI.  Could be preterm labor but pain is not cyclic and there is no tightening.  Cervix long/closed.  ASSESSMENT Single intrauterine pregnancy at [redacted]w[redacted]d Low abdominal pain Low back pain  PLAN Await results Care turned over to oncoming provider.   Wynelle Bourgeois CNM, MSN Certified Nurse-Midwife 07/20/2016  7:35 AM   Care received from [facility]. Williams CNM @ 0800  A:   1. Abdominal pain in pregnancy, second trimester   2. Bacterial vaginosis     P:  Discharge home in stable condition Ok to use Ibuprofen sparingly for the next 2 days only, as directed on the bottle  Rx: Flagyl  Return to MAU if symptoms worsen Pregnancy support belt encouraged   Osie Amparo, Harolyn Rutherford, NP 07/20/2016 10:18 AM

## 2016-07-20 NOTE — Discharge Instructions (Signed)
Abdominal Pain During Pregnancy °Belly (abdominal) pain is common during pregnancy. Most of the time, it is not a serious problem. Other times, it can be a sign that something is wrong with the pregnancy. Always tell your doctor if you have belly pain. °Follow these instructions at home: °Monitor your belly pain for any changes. The following actions may help you feel better: °· Do not have sex (intercourse) or put anything in your vagina until you feel better. °· Rest until your pain stops. °· Drink clear fluids if you feel sick to your stomach (nauseous). Do not eat solid food until you feel better. °· Only take medicine as told by your doctor. °· Keep all doctor visits as told. °Get help right away if: °· You are bleeding, leaking fluid, or pieces of tissue come out of your vagina. °· You have more pain or cramping. °· You keep throwing up (vomiting). °· You have pain when you pee (urinate) or have blood in your pee. °· You have a fever. °· You do not feel your baby moving as much. °· You feel very weak or feel like passing out. °· You have trouble breathing, with or without belly pain. °· You have a very bad headache and belly pain. °· You have fluid leaking from your vagina and belly pain. °· You keep having watery poop (diarrhea). °· Your belly pain does not go away after resting, or the pain gets worse. °This information is not intended to replace advice given to you by your health care provider. Make sure you discuss any questions you have with your health care provider. °Document Released: 02/16/2009 Document Revised: 10/07/2015 Document Reviewed: 09/27/2012 °Elsevier Interactive Patient Education © 2017 [employer] ° °Bacterial Vaginosis °Bacterial vaginosis is an infection of the vagina. It happens when too many germs (bacteria) grow in the vagina. This infection puts you at risk for infections from sex (STIs). Treating this infection can lower your risk for some STIs. You should also treat this if you  are pregnant. It can cause your baby to be born early. °Follow these instructions at home: °Medicines °· Take over-the-counter and prescription medicines only as told by your doctor. °· Take or use your antibiotic medicine as told by your doctor. Do not stop taking or using it even if you start to feel better. °General instructions °· If you your sexual partner is a woman, tell her that you have this infection. She needs to get treatment if she has symptoms. If you have a female partner, he does not need to be treated. °· During treatment: °¨ Avoid sex. °¨ Do not douche. °¨ Avoid alcohol as told. °¨ Avoid breastfeeding as told. °· Drink enough fluid to keep your pee (urine) clear or pale yellow. °· Keep your vagina and butt (rectum) clean. °¨ Wash the area with warm water every day. °¨ Wipe from front to back after you use the toilet. °· Keep all follow-up visits as told by your doctor. This is important. °Preventing this condition °· Do not douche. °· Use only warm water to wash around your vagina. °· Use protection when you have sex. This includes: °¨ Latex condoms. °¨ Dental dams. °· Limit how many people you have sex with. It is best to only have sex with the same person (be monogamous). °· Get tested for STIs. Have your partner get tested. °· Wear underwear that is cotton or lined with cotton. °· Avoid tight pants and pantyhose. This is most important in summer. °·   Do not use any products that have nicotine or tobacco in them. These include cigarettes and e-cigarettes. If you need help quitting, ask your doctor. °· Do not use illegal drugs. °· Limit how much alcohol you drink. °Contact a doctor if: °· Your symptoms do not get better, even after you are treated. °· You have more discharge or pain when you pee (urinate). °· You have a fever. °· You have pain in your belly (abdomen). °· You have pain with sex. °· Your bleed from your vagina between periods. °Summary °· This infection happens when too many germs  (bacteria) grow in the vagina. °· Treating this condition can lower your risk for some infections from sex (STIs). °· You should also treat this if you are pregnant. It can cause early (premature) birth. °· Do not stop taking or using your antibiotic medicine even if you start to feel better. °This information is not intended to replace advice given to you by your health care provider. Make sure you discuss any questions you have with your health care provider. °Document Released: 12/08/2007 Document Revised: 11/14/2015 Document Reviewed: 11/14/2015 °Elsevier Interactive Patient Education © 2017 [employer] ° °

## 2016-07-20 NOTE — MAU Note (Signed)
Pt states she is having back and abdominal pain both 7/10.  Was treated for trich a week ago and this feels similar. Not sure if her partner got the medication. Still having vaginal discharge. No bleeding or LOF

## 2016-07-21 ENCOUNTER — Encounter: Payer: Self-pay | Admitting: Family Medicine

## 2016-07-21 ENCOUNTER — Ambulatory Visit (INDEPENDENT_AMBULATORY_CARE_PROVIDER_SITE_OTHER): Payer: Medicaid Other | Admitting: Family Medicine

## 2016-07-21 ENCOUNTER — Encounter: Payer: Self-pay | Admitting: *Deleted

## 2016-07-21 ENCOUNTER — Other Ambulatory Visit (HOSPITAL_COMMUNITY)
Admission: RE | Admit: 2016-07-21 | Discharge: 2016-07-21 | Disposition: A | Discharge status: Home / Self Care | Payer: Medicaid Other | Source: Ambulatory Visit | Attending: Family Medicine | Admitting: Family Medicine

## 2016-07-21 VITALS — BP 97/61 | HR 102 | Wt 200.2 lb

## 2016-07-21 DIAGNOSIS — O09212 Supervision of pregnancy with history of pre-term labor, second trimester: Secondary | ICD-10-CM | POA: Diagnosis not present

## 2016-07-21 DIAGNOSIS — O0992 Supervision of high risk pregnancy, unspecified, second trimester: Secondary | ICD-10-CM

## 2016-07-21 DIAGNOSIS — O09219 Supervision of pregnancy with history of pre-term labor, unspecified trimester: Secondary | ICD-10-CM

## 2016-07-21 DIAGNOSIS — Z1151 Encounter for screening for human papillomavirus (HPV): Secondary | ICD-10-CM

## 2016-07-21 DIAGNOSIS — I6783 Posterior reversible encephalopathy syndrome: Secondary | ICD-10-CM | POA: Diagnosis not present

## 2016-07-21 DIAGNOSIS — O09292 Supervision of pregnancy with other poor reproductive or obstetric history, second trimester: Secondary | ICD-10-CM

## 2016-07-21 DIAGNOSIS — Z124 Encounter for screening for malignant neoplasm of cervix: Secondary | ICD-10-CM

## 2016-07-21 DIAGNOSIS — O09299 Supervision of pregnancy with other poor reproductive or obstetric history, unspecified trimester: Secondary | ICD-10-CM

## 2016-07-21 DIAGNOSIS — Z113 Encounter for screening for infections with a predominantly sexual mode of transmission: Secondary | ICD-10-CM | POA: Diagnosis not present

## 2016-07-21 DIAGNOSIS — O09899 Supervision of other high risk pregnancies, unspecified trimester: Secondary | ICD-10-CM

## 2016-07-21 DIAGNOSIS — O0990 Supervision of high risk pregnancy, unspecified, unspecified trimester: Secondary | ICD-10-CM

## 2016-07-21 LAB — POCT URINALYSIS DIP (DEVICE)
GLUCOSE, UA: NEGATIVE mg/dL
HGB URINE DIPSTICK: NEGATIVE
KETONES UR: NEGATIVE mg/dL
Leukocytes, UA: NEGATIVE
Nitrite: NEGATIVE
PH: 6.5 (ref 5.0–8.0)
PROTEIN: NEGATIVE mg/dL
Urobilinogen, UA: 1 mg/dL (ref 0.0–1.0)

## 2016-07-21 MED ORDER — HYDROXYPROGESTERONE CAPROATE 250 MG/ML IM OIL
250.0000 mg | TOPICAL_OIL | Freq: Once | INTRAMUSCULAR | Status: AC
  Administered 2016-08-29: 250 mg via INTRAMUSCULAR

## 2016-07-21 MED ORDER — ASPIRIN 81 MG PO CHEW: 81.0000 mg | CHEWABLE_TABLET | Freq: Every day | ORAL | 3 refills | Status: DC

## 2016-07-21 NOTE — Progress Notes (Signed)
Subjective:    Alicia Castro is a [id number] [redacted]w[redacted]d being seen today for her first obstetrical visit.  Her obstetrical history is significant for obesity, pre-eclampsia, smoker and history of PTB x 3 and h/o stillbirth. Patient does not intend to breast feed. Pregnancy history fully reviewed.  Patient reports no complaints.  Vitals:   07/21/16 1329  BP: 97/61  Pulse: (!) 102  Weight: 200 lb 3.2 oz (90.8 kg)    HISTORY: OB History  Gravida Para Term Preterm AB Living  7 4 2 2 2 3   SAB TAB Ectopic Multiple Live Births    2   0 3    # Outcome Date GA Lbr Len/2nd Weight Sex Delivery Anes PTL Lv  7 Current           6 Preterm 04/16/15 [redacted]w[redacted]d 228:05 / 00:03 4 lb 12.2 oz (2.16 kg) F Vag-Spont EPI  LIV  5 Preterm 03/31/13 [redacted]w[redacted]d 22:46 / 00:35 5 lb 6.4 oz (2.45 kg) F Vag-Spont EPI  LIV  4 Term 2008        FD  3 Term 2005        LIV  2 TAB           1 TAB              Past Medical History:  Diagnosis Date  . Abnormal Pap smear   . Chlamydia   . Eclampsia   . Gonorrhea   . Preterm labor   . Trichomonas   . UTI (lower urinary tract infection)    pt has h/o Acinetobacter in urine culture-requires Contact Isolation with each hodpitalization  . Vaginal Pap smear, abnormal    Past Surgical History:  Procedure Laterality Date  . COLPOSCOPY    . WISDOM TOOTH EXTRACTION     Family History  Problem Relation Age of Onset  . Cancer Maternal Grandmother      Exam    Uterus:   20 wk size  Pelvic Exam:    Perineum: Normal Perineum   Vulva: Bartholin's, Urethra, Skene's normal   Vagina:  normal mucosa, normal discharge   Cervix: multiparous appearance and no bleeding following Pap   Adnexa: normal adnexa   Bony Pelvis: average  System: Breast:  normal appearance, no masses or tenderness   Skin: normal coloration and turgor, no rashes    Neurologic: normal   Extremities: normal strength, tone, and muscle mass   HEENT extra ocular movement intact and sclera clear,  anicteric   Mouth/Teeth mucous membranes moist, pharynx normal without lesions   Neck supple   Cardiovascular: regular rate and rhythm, no murmurs or gallops   Respiratory:  appears well, vitals normal, no respiratory distress, acyanotic, normal RR, ear and throat exam is normal, neck free of mass or lymphadenopathy, chest clear, no wheezing, crepitations, rhonchi, normal symmetric air entry   Abdomen: soft, non-tender; bowel sounds normal; no masses,  no organomegaly      Assessment/Plan:    Pregnancy: [id number] 1. Supervision of high risk pregnancy, antepartum - Culture, OB Urine - Obstetric Panel, Including HIV - Cytology - PAP - AFP, Quad Screen  2. History of pre-eclampsia in prior pregnancy, currently pregnant - Comprehensive metabolic panel - Protein / creatinine ratio, urine Baby ASA daily  3. History of preterm delivery, currently pregnant Begin 17 P weekly - hydroxyprogesterone caproate (MAKENA) 250 mg/mL injection 250 mg; Inject 1 mL (250 mg total) into the muscle once.  4. History of stillbirth in  pregnant patient in second trimester, antepartum Will need serial u/s for growth and antenatal testing.     Reva Boresanya S Giulliana Mcroberts 07/21/2016

## 2016-07-21 NOTE — Patient Instructions (Signed)
 Second Trimester of Pregnancy The second trimester is from week 14 through week 27 (months 4 through 6). The second trimester is often a time when you feel your best. Your body has adjusted to being pregnant, and you begin to feel better physically. Usually, morning sickness has lessened or quit completely, you may have more energy, and you may have an increase in appetite. The second trimester is also a time when the fetus is growing rapidly. At the end of the sixth month, the fetus is about 9 inches long and weighs about 1 pounds. You will likely begin to feel the baby move (quickening) between 16 and 20 weeks of pregnancy. Body changes during your second trimester Your body continues to go through many changes during your second trimester. The changes vary from woman to woman.  Your weight will continue to increase. You will notice your lower abdomen bulging out.  You may begin to get stretch marks on your hips, abdomen, and breasts.  You may develop headaches that can be relieved by medicines. The medicines should be approved by your health care provider.  You may urinate more often because the fetus is pressing on your bladder.  You may develop or continue to have heartburn as a result of your pregnancy.  You may develop constipation because certain hormones are causing the muscles that push waste through your intestines to slow down.  You may develop hemorrhoids or swollen, bulging veins (varicose veins).  You may have back pain. This is caused by: ? Weight gain. ? Pregnancy hormones that are relaxing the joints in your pelvis. ? A shift in weight and the muscles that support your balance.  Your breasts will continue to grow and they will continue to become tender.  Your gums may bleed and may be sensitive to brushing and flossing.  Dark spots or blotches (chloasma, mask of pregnancy) may develop on your face. This will likely fade after the baby is born.  A dark line from  your belly button to the pubic area (linea nigra) may appear. This will likely fade after the baby is born.  You may have changes in your hair. These can include thickening of your hair, rapid growth, and changes in texture. Some women also have hair loss during or after pregnancy, or hair that feels dry or thin. Your hair will most likely return to normal after your baby is born.  What to expect at prenatal visits During a routine prenatal visit:  You will be weighed to make sure you and the fetus are growing normally.  Your blood pressure will be taken.  Your abdomen will be measured to track your baby's growth.  The fetal heartbeat will be listened to.  Any test results from the previous visit will be discussed.  Your health care provider may ask you:  How you are feeling.  If you are feeling the baby move.  If you have had any abnormal symptoms, such as leaking fluid, bleeding, severe headaches, or abdominal cramping.  If you are using any tobacco products, including cigarettes, chewing tobacco, and electronic cigarettes.  If you have any questions.  Other tests that may be performed during your second trimester include:  Blood tests that check for: ? Low iron levels (anemia). ? High blood sugar that affects pregnant women (gestational diabetes) between 24 and 28 weeks. ? Rh antibodies. This is to check for a protein on red blood cells (Rh factor).  Urine tests to check for infections, diabetes,   or protein in the urine.  An ultrasound to confirm the proper growth and development of the baby.  An amniocentesis to check for possible genetic problems.  Fetal screens for spina bifida and Down syndrome.  HIV (human immunodeficiency virus) testing. Routine prenatal testing includes screening for HIV, unless you choose not to have this test.  Follow these instructions at home: Medicines  Follow your health care provider's instructions regarding medicine use. Specific  medicines may be either safe or unsafe to take during pregnancy.  Take a prenatal vitamin that contains at least 600 micrograms (mcg) of folic acid.  If you develop constipation, try taking a stool softener if your health care provider approves. Eating and drinking  Eat a balanced diet that includes fresh fruits and vegetables, whole grains, good sources of protein such as meat, eggs, or tofu, and low-fat dairy. Your health care provider will help you determine the amount of weight gain that is right for you.  Avoid raw meat and uncooked cheese. These carry germs that can cause birth defects in the baby.  If you have low calcium intake from food, talk to your health care provider about whether you should take a daily calcium supplement.  Limit foods that are high in fat and processed sugars, such as fried and sweet foods.  To prevent constipation: ? Drink enough fluid to keep your urine clear or pale yellow. ? Eat foods that are high in fiber, such as fresh fruits and vegetables, whole grains, and beans. Activity  Exercise only as directed by your health care provider. Most women can continue their usual exercise routine during pregnancy. Try to exercise for 30 minutes at least 5 days a week. Stop exercising if you experience uterine contractions.  Avoid heavy lifting, wear low heel shoes, and practice good posture.  A sexual relationship may be continued unless your health care provider directs you otherwise. Relieving pain and discomfort  Wear a good support bra to prevent discomfort from breast tenderness.  Take warm sitz baths to soothe any pain or discomfort caused by hemorrhoids. Use hemorrhoid cream if your health care provider approves.  Rest with your legs elevated if you have leg cramps or low back pain.  If you develop varicose veins, wear support hose. Elevate your feet for 15 minutes, 3-4 times a day. Limit salt in your diet. Prenatal Care  Write down your questions.  Take them to your prenatal visits.  Keep all your prenatal visits as told by your health care provider. This is important. Safety  Wear your seat belt at all times when driving.  Make a list of emergency phone numbers, including numbers for family, friends, the hospital, and police and fire departments. General instructions  Ask your health care provider for a referral to a local prenatal education class. Begin classes no later than the beginning of month 6 of your pregnancy.  Ask for help if you have counseling or nutritional needs during pregnancy. Your health care provider can offer advice or refer you to specialists for help with various needs.  Do not use hot tubs, steam rooms, or saunas.  Do not douche or use tampons or scented sanitary pads.  Do not cross your legs for long periods of time.  Avoid cat litter boxes and soil used by cats. These carry germs that can cause birth defects in the baby and possibly loss of the fetus by miscarriage or stillbirth.  Avoid all smoking, herbs, alcohol, and unprescribed drugs. Chemicals in these products   can affect the formation and growth of the baby.  Do not use any products that contain nicotine or tobacco, such as cigarettes and e-cigarettes. If you need help quitting, ask your health care provider.  Visit your dentist if you have not gone yet during your pregnancy. Use a soft toothbrush to brush your teeth and be gentle when you floss. Contact a health care provider if:  You have dizziness.  You have mild pelvic cramps, pelvic pressure, or nagging pain in the abdominal area.  You have persistent nausea, vomiting, or diarrhea.  You have a bad smelling vaginal discharge.  You have pain when you urinate. Get help right away if:  You have a fever.  You are leaking fluid from your vagina.  You have spotting or bleeding from your vagina.  You have severe abdominal cramping or pain.  You have rapid weight gain or weight  loss.  You have shortness of breath with chest pain.  You notice sudden or extreme swelling of your face, hands, ankles, feet, or legs.  You have not felt your baby move in over an hour.  You have severe headaches that do not go away when you take medicine.  You have vision changes. Summary  The second trimester is from week 14 through week 27 (months 4 through 6). It is also a time when the fetus is growing rapidly.  Your body goes through many changes during pregnancy. The changes vary from woman to woman.  Avoid all smoking, herbs, alcohol, and unprescribed drugs. These chemicals affect the formation and growth your baby.  Do not use any tobacco products, such as cigarettes, chewing tobacco, and e-cigarettes. If you need help quitting, ask your health care provider.  Contact your health care provider if you have any questions. Keep all prenatal visits as told by your health care provider. This is important. This information is not intended to replace advice given to you by your health care provider. Make sure you discuss any questions you have with your health care provider. Document Released: 02/22/2001 Document Revised: 08/06/2015 Document Reviewed: 05/01/2012 Elsevier Interactive Patient Education  2017 [employer]   Breastfeeding Deciding to breastfeed is one of the best choices you can make for you and your baby. A change in hormones during pregnancy causes your breast tissue to grow and increases the number and size of your milk ducts. These hormones also allow proteins, sugars, and fats from your blood supply to make breast milk in your milk-producing glands. Hormones prevent breast milk from being released before your baby is born as well as prompt milk flow after birth. Once breastfeeding has begun, thoughts of your baby, as well as his or her sucking or crying, can stimulate the release of milk from your milk-producing glands. Benefits of breastfeeding For Your  Baby  Your first milk (colostrum) helps your baby's digestive system function better.  There are antibodies in your milk that help your baby fight off infections.  Your baby has a lower incidence of asthma, allergies, and sudden infant death syndrome.  The nutrients in breast milk are better for your baby than infant formulas and are designed uniquely for your baby's needs.  Breast milk improves your baby's brain development.  Your baby is less likely to develop other conditions, such as childhood obesity, asthma, or type 2 diabetes mellitus.  For You  Breastfeeding helps to create a very special bond between you and your baby.  Breastfeeding is convenient. Breast milk is always available at   the correct temperature and costs nothing.  Breastfeeding helps to burn calories and helps you lose the weight gained during pregnancy.  Breastfeeding makes your uterus contract to its prepregnancy size faster and slows bleeding (lochia) after you give birth.  Breastfeeding helps to lower your risk of developing type 2 diabetes mellitus, osteoporosis, and breast or ovarian cancer later in life.  Signs that your baby is hungry Early Signs of Hunger  Increased alertness or activity.  Stretching.  Movement of the head from side to side.  Movement of the head and opening of the mouth when the corner of the mouth or cheek is stroked (rooting).  Increased sucking sounds, smacking lips, cooing, sighing, or squeaking.  Hand-to-mouth movements.  Increased sucking of fingers or hands.  Late Signs of Hunger  Fussing.  Intermittent crying.  Extreme Signs of Hunger Signs of extreme hunger will require calming and consoling before your baby will be able to breastfeed successfully. Do not wait for the following signs of extreme hunger to occur before you initiate breastfeeding:  Restlessness.  A loud, strong cry.  Screaming.  Breastfeeding basics Breastfeeding Initiation  Find a  comfortable place to sit or lie down, with your neck and back well supported.  Place a pillow or rolled up blanket under your baby to bring him or her to the level of your breast (if you are seated). Nursing pillows are specially designed to help support your arms and your baby while you breastfeed.  Make sure that your baby's abdomen is facing your abdomen.  Gently massage your breast. With your fingertips, massage from your chest wall toward your nipple in a circular motion. This encourages milk flow. You may need to continue this action during the feeding if your milk flows slowly.  Support your breast with 4 fingers underneath and your thumb above your nipple. Make sure your fingers are well away from your nipple and your baby's mouth.  Stroke your baby's lips gently with your finger or nipple.  When your baby's mouth is open wide enough, quickly bring your baby to your breast, placing your entire nipple and as much of the colored area around your nipple (areola) as possible into your baby's mouth. ? More areola should be visible above your baby's upper lip than below the lower lip. ? Your baby's tongue should be between his or her lower gum and your breast.  Ensure that your baby's mouth is correctly positioned around your nipple (latched). Your baby's lips should create a seal on your breast and be turned out (everted).  It is common for your baby to suck about 2-3 minutes in order to start the flow of breast milk.  Latching Teaching your baby how to latch on to your breast properly is very important. An improper latch can cause nipple pain and decreased milk supply for you and poor weight gain in your baby. Also, if your baby is not latched onto your nipple properly, he or she may swallow some air during feeding. This can make your baby fussy. Burping your baby when you switch breasts during the feeding can help to get rid of the air. However, teaching your baby to latch on properly is  still the best way to prevent fussiness from swallowing air while breastfeeding. Signs that your baby has successfully latched on to your nipple:  Silent tugging or silent sucking, without causing you pain.  Swallowing heard between every 3-4 sucks.  Muscle movement above and in front of his or her   ears while sucking.  Signs that your baby has not successfully latched on to nipple:  Sucking sounds or smacking sounds from your baby while breastfeeding.  Nipple pain.  If you think your baby has not latched on correctly, slip your finger into the corner of your baby's mouth to break the suction and place it between your baby's gums. Attempt breastfeeding initiation again. Signs of Successful Breastfeeding Signs from your baby:  A gradual decrease in the number of sucks or complete cessation of sucking.  Falling asleep.  Relaxation of his or her body.  Retention of a small amount of milk in his or her mouth.  Letting go of your breast by himself or herself.  Signs from you:  Breasts that have increased in firmness, weight, and size 1-3 hours after feeding.  Breasts that are softer immediately after breastfeeding.  Increased milk volume, as well as a change in milk consistency and color by the fifth day of breastfeeding.  Nipples that are not sore, cracked, or bleeding.  Signs That Your Baby is Getting Enough Milk  Wetting at least 1-2 diapers during the first 24 hours after birth.  Wetting at least 5-6 diapers every 24 hours for the first week after birth. The urine should be clear or pale yellow by 5 days after birth.  Wetting 6-8 diapers every 24 hours as your baby continues to grow and develop.  At least 3 stools in a 24-hour period by age 5 days. The stool should be soft and yellow.  At least 3 stools in a 24-hour period by age 7 days. The stool should be seedy and yellow.  No loss of weight greater than 10% of birth weight during the first 3 days of age.  Average  weight gain of 4-7 ounces (113-198 g) per week after age 4 days.  Consistent daily weight gain by age 5 days, without weight loss after the age of 2 weeks.  After a feeding, your baby may spit up a small amount. This is common. Breastfeeding frequency and duration Frequent feeding will help you make more milk and can prevent sore nipples and breast engorgement. Breastfeed when you feel the need to reduce the fullness of your breasts or when your baby shows signs of hunger. This is called "breastfeeding on demand." Avoid introducing a pacifier to your baby while you are working to establish breastfeeding (the first 4-6 weeks after your baby is born). After this time you may choose to use a pacifier. Research has shown that pacifier use during the first year of a baby's life decreases the risk of sudden infant death syndrome (SIDS). Allow your baby to feed on each breast as long as he or she wants. Breastfeed until your baby is finished feeding. When your baby unlatches or falls asleep while feeding from the first breast, offer the second breast. Because newborns are often sleepy in the first few weeks of life, you may need to awaken your baby to get him or her to feed. Breastfeeding times will vary from baby to baby. However, the following rules can serve as a guide to help you ensure that your baby is properly fed:  Newborns (babies 4 weeks of age or younger) may breastfeed every 1-3 hours.  Newborns should not go longer than 3 hours during the day or 5 hours during the night without breastfeeding.  You should breastfeed your baby a minimum of 8 times in a 24-hour period until you begin to introduce solid foods to your   baby at around 6 months of age.  Breast milk pumping Pumping and storing breast milk allows you to ensure that your baby is exclusively fed your breast milk, even at times when you are unable to breastfeed. This is especially important if you are going back to work while you are still  breastfeeding or when you are not able to be present during feedings. Your lactation consultant can give you guidelines on how long it is safe to store breast milk. A breast pump is a machine that allows you to pump milk from your breast into a sterile bottle. The pumped breast milk can then be stored in a refrigerator or freezer. Some breast pumps are operated by hand, while others use electricity. Ask your lactation consultant which type will work best for you. Breast pumps can be purchased, but some hospitals and breastfeeding support groups lease breast pumps on a monthly basis. A lactation consultant can teach you how to hand express breast milk, if you prefer not to use a pump. Caring for your breasts while you breastfeed Nipples can become dry, cracked, and sore while breastfeeding. The following recommendations can help keep your breasts moisturized and healthy:  Avoid using soap on your nipples.  Wear a supportive bra. Although not required, special nursing bras and tank tops are designed to allow access to your breasts for breastfeeding without taking off your entire bra or top. Avoid wearing underwire-style bras or extremely tight bras.  Air dry your nipples for 3-4minutes after each feeding.  Use only cotton bra pads to absorb leaked breast milk. Leaking of breast milk between feedings is normal.  Use lanolin on your nipples after breastfeeding. Lanolin helps to maintain your skin's normal moisture barrier. If you use pure lanolin, you do not need to wash it off before feeding your baby again. Pure lanolin is not toxic to your baby. You may also hand express a few drops of breast milk and gently massage that milk into your nipples and allow the milk to air dry.  In the first few weeks after giving birth, some women experience extremely full breasts (engorgement). Engorgement can make your breasts feel heavy, warm, and tender to the touch. Engorgement peaks within 3-5 days after you give  birth. The following recommendations can help ease engorgement:  Completely empty your breasts while breastfeeding or pumping. You may want to start by applying warm, moist heat (in the shower or with warm water-soaked hand towels) just before feeding or pumping. This increases circulation and helps the milk flow. If your baby does not completely empty your breasts while breastfeeding, pump any extra milk after he or she is finished.  Wear a snug bra (nursing or regular) or tank top for 1-2 days to signal your body to slightly decrease milk production.  Apply ice packs to your breasts, unless this is too uncomfortable for you.  Make sure that your baby is latched on and positioned properly while breastfeeding.  If engorgement persists after 48 hours of following these recommendations, contact your health care provider or a lactation consultant. Overall health care recommendations while breastfeeding  Eat healthy foods. Alternate between meals and snacks, eating 3 of each per day. Because what you eat affects your breast milk, some of the foods may make your baby more irritable than usual. Avoid eating these foods if you are sure that they are negatively affecting your baby.  Drink milk, fruit juice, and water to satisfy your thirst (about 10 glasses a day).    Rest often, relax, and continue to take your prenatal vitamins to prevent fatigue, stress, and anemia.  Continue breast self-awareness checks.  Avoid chewing and smoking tobacco. Chemicals from cigarettes that pass into breast milk and exposure to secondhand smoke may harm your baby.  Avoid alcohol and drug use, including marijuana. Some medicines that may be harmful to your baby can pass through breast milk. It is important to ask your health care provider before taking any medicine, including all over-the-counter and prescription medicine as well as vitamin and herbal supplements. It is possible to become pregnant while breastfeeding.  If birth control is desired, ask your health care provider about options that will be safe for your baby. Contact a health care provider if:  You feel like you want to stop breastfeeding or have become frustrated with breastfeeding.  You have painful breasts or nipples.  Your nipples are cracked or bleeding.  Your breasts are red, tender, or warm.  You have a swollen area on either breast.  You have a fever or chills.  You have nausea or vomiting.  You have drainage other than breast milk from your nipples.  Your breasts do not become full before feedings by the fifth day after you give birth.  You feel sad and depressed.  Your baby is too sleepy to eat well.  Your baby is having trouble sleeping.  Your baby is wetting less than 3 diapers in a 24-hour period.  Your baby has less than 3 stools in a 24-hour period.  Your baby's skin or the white part of his or her eyes becomes yellow.  Your baby is not gaining weight by 5 days of age. Get help right away if:  Your baby is overly tired (lethargic) and does not want to wake up and feed.  Your baby develops an unexplained fever. This information is not intended to replace advice given to you by your health care provider. Make sure you discuss any questions you have with your health care provider. Document Released: 02/28/2005 Document Revised: 08/12/2015 Document Reviewed: 08/22/2012 Elsevier Interactive Patient Education  2017 [employer].  

## 2016-07-22 LAB — OBSTETRIC PANEL, INCLUDING HIV
Antibody Screen: NEGATIVE
Basophils Absolute: 0 10*3/uL (ref 0.0–0.2)
Basos: 0 %
EOS (ABSOLUTE): 0.2 10*3/uL (ref 0.0–0.4)
EOS: 2 %
HEMOGLOBIN: 11.3 g/dL (ref 11.1–15.9)
HEP B S AG: NEGATIVE
HIV Screen 4th Generation wRfx: NONREACTIVE
Hematocrit: 34.2 % (ref 34.0–46.6)
IMMATURE GRANS (ABS): 0 10*3/uL (ref 0.0–0.1)
IMMATURE GRANULOCYTES: 0 %
LYMPHS: 23 %
Lymphocytes Absolute: 1.9 10*3/uL (ref 0.7–3.1)
MCH: 31 pg (ref 26.6–33.0)
MCHC: 33 g/dL (ref 31.5–35.7)
MCV: 94 fL (ref 79–97)
Monocytes Absolute: 0.4 10*3/uL (ref 0.1–0.9)
Monocytes: 5 %
NEUTROS PCT: 70 %
Neutrophils Absolute: 5.7 10*3/uL (ref 1.4–7.0)
Platelets: 259 10*3/uL (ref 150–379)
RBC: 3.64 x10E6/uL — AB (ref 3.77–5.28)
RDW: 14.2 % (ref 12.3–15.4)
RH TYPE: POSITIVE
RPR: NONREACTIVE
Rubella Antibodies, IGG: 5.61 index (ref >0.99)
WBC: 8.3 10*3/uL (ref 3.4–10.8)

## 2016-07-22 LAB — COMPREHENSIVE METABOLIC PANEL
ALBUMIN: 3.7 g/dL (ref 3.5–5.5)
ALK PHOS: 38 IU/L — AB (ref 39–117)
ALT: 7 IU/L (ref 0–32)
AST: 12 IU/L (ref 0–40)
Albumin/Globulin Ratio: 1.4 (ref 1.2–2.2)
BUN / CREAT RATIO: 11 (ref 9–23)
BUN: 7 mg/dL (ref 6–20)
Bilirubin Total: <0.2 mg/dL (ref 0.0–1.2)
CO2: 24 mmol/L (ref 18–29)
CREATININE: 0.64 mg/dL (ref 0.57–1.00)
Calcium: 9.2 mg/dL (ref 8.7–10.2)
Chloride: 101 mmol/L (ref 96–106)
GFR calc Af Amer: 138 mL/min/{1.73_m2} (ref >59)
GFR calc non Af Amer: 119 mL/min/{1.73_m2} (ref >59)
GLUCOSE: 72 mg/dL (ref 65–99)
Globulin, Total: 2.7 g/dL (ref 1.5–4.5)
Potassium: 3.6 mmol/L (ref 3.5–5.2)
Sodium: 140 mmol/L (ref 134–144)
Total Protein: 6.4 g/dL (ref 6.0–8.5)

## 2016-07-22 LAB — PROTEIN / CREATININE RATIO, URINE
CREATININE, UR: 198.9 mg/dL
PROTEIN UR: 35.6 mg/dL
PROTEIN/CREAT RATIO: 179 mg/g{creat} (ref 0–200)

## 2016-07-23 LAB — CULTURE, OB URINE

## 2016-07-23 LAB — URINE CULTURE, OB REFLEX

## 2016-07-25 ENCOUNTER — Encounter: Payer: Self-pay | Admitting: *Deleted

## 2016-07-25 NOTE — Progress Notes (Signed)
Toniann FailWendy a Teacher, early years/prepharmacist from [facility] called to ask about prior auth for pt [employer] injector. Upon investigation, noted that Dr Shawnie PonsPratt had IM injections in her notes. Spoke with Dr Shawnie PonsPratt in clinic who verified that she did not want the patient to have the auto injector, she prefers the IM injection. Called Toniann FailWendy back and notified her of the change. Understanding was voiced, she stated we should have makena by Wed morning.

## 2016-07-26 LAB — AFP, QUAD SCREEN
DIA Mom Value: 1.65
DIA Value (EIA): 271.8 pg/mL
DSR (BY AGE) 1 IN: 553
DSR (SECOND TRIMESTER) 1 IN: 280
GESTATIONAL AGE AFP: 20 wk
MATERNAL AGE AT EDD: 31.7 a
MSAFP MOM: 0.91
MSAFP: 46.9 ng/mL
MSHCG Mom: 1.52
MSHCG: 29968 m[IU]/mL
OSB RISK: 10000
T18 (By Age): 1:2153 {titer}
TEST RESULTS AFP: NEGATIVE
WEIGHT: 200 [lb_av]
uE3 Mom: 0.69
uE3 Value: 1.15 ng/mL

## 2016-07-27 ENCOUNTER — Telehealth: Payer: Self-pay | Admitting: *Deleted

## 2016-07-27 LAB — CYTOLOGY - PAP
Adequacy: ABSENT
CHLAMYDIA, DNA PROBE: NEGATIVE
DIAGNOSIS: NEGATIVE
HPV (WINDOPATH): NOT DETECTED
Neisseria Gonorrhea: NEGATIVE

## 2016-07-27 NOTE — Telephone Encounter (Signed)
Received a message on the nurse voicemail from Lupita Leashonna with [facility].  States they received a message to ship 2 courtesy vials to the office.  Needs shipping address.  Requests a return call.  I returned the call and Lupita LeashDonna states they had received another call from the company to cancel the courtesy shipment as [facility] shipped the vials on 07/25/16.

## 2016-07-28 ENCOUNTER — Ambulatory Visit: Payer: Medicaid Other

## 2016-07-28 ENCOUNTER — Telehealth: Payer: Self-pay | Admitting: *Deleted

## 2016-07-28 NOTE — Telephone Encounter (Signed)
Received message left on nurse voicemail on 07/27/16 at 1622.  [facility] calling to verify if a verbal order was given to the pharmacy to ship makena vials instead of auto injector.  States the pharmacy couldn't find any notes that a verbal had been given.  Requests a return call to [phone].

## 2016-07-28 NOTE — Telephone Encounter (Signed)
Spoke with Alicia Castro and there will be shipping the vials instead of the auto injector. [employer] does not want to cover it.

## 2016-08-04 ENCOUNTER — Ambulatory Visit: Payer: Medicaid Other

## 2016-08-11 ENCOUNTER — Ambulatory Visit: Payer: Medicaid Other

## 2016-08-17 ENCOUNTER — Telehealth: Payer: Self-pay | Admitting: *Deleted

## 2016-08-17 NOTE — Telephone Encounter (Signed)
Of noted Alicia Castro has arrived, called patient and left message we have your medicine , please call and make appointment to come in for first injection this week.

## 2016-08-18 NOTE — Telephone Encounter (Signed)
Pt has appt Monday, will get her first injection at that time.

## 2016-08-22 ENCOUNTER — Ambulatory Visit (INDEPENDENT_AMBULATORY_CARE_PROVIDER_SITE_OTHER): Payer: Self-pay | Admitting: Family Medicine

## 2016-08-22 VITALS — BP 117/72 | HR 86 | Wt 193.2 lb

## 2016-08-22 DIAGNOSIS — O09899 Supervision of other high risk pregnancies, unspecified trimester: Secondary | ICD-10-CM

## 2016-08-22 DIAGNOSIS — O09219 Supervision of pregnancy with history of pre-term labor, unspecified trimester: Principal | ICD-10-CM

## 2016-08-22 DIAGNOSIS — O09292 Supervision of pregnancy with other poor reproductive or obstetric history, second trimester: Secondary | ICD-10-CM

## 2016-08-22 DIAGNOSIS — O09299 Supervision of pregnancy with other poor reproductive or obstetric history, unspecified trimester: Secondary | ICD-10-CM

## 2016-08-22 DIAGNOSIS — O09212 Supervision of pregnancy with history of pre-term labor, second trimester: Secondary | ICD-10-CM

## 2016-08-22 MED ORDER — HYDROXYPROGESTERONE CAPROATE 250 MG/ML IM OIL
250.0000 mg | TOPICAL_OIL | Freq: Once | INTRAMUSCULAR | Status: AC
  Administered 2016-08-22: 250 mg via INTRAMUSCULAR

## 2016-08-22 NOTE — Patient Instructions (Signed)
 Second Trimester of Pregnancy The second trimester is from week 14 through week 27 (months 4 through 6). The second trimester is often a time when you feel your best. Your body has adjusted to being pregnant, and you begin to feel better physically. Usually, morning sickness has lessened or quit completely, you may have more energy, and you may have an increase in appetite. The second trimester is also a time when the fetus is growing rapidly. At the end of the sixth month, the fetus is about 9 inches long and weighs about 1 pounds. You will likely begin to feel the baby move (quickening) between 16 and 20 weeks of pregnancy. Body changes during your second trimester Your body continues to go through many changes during your second trimester. The changes vary from woman to woman.  Your weight will continue to increase. You will notice your lower abdomen bulging out.  You may begin to get stretch marks on your hips, abdomen, and breasts.  You may develop headaches that can be relieved by medicines. The medicines should be approved by your health care provider.  You may urinate more often because the fetus is pressing on your bladder.  You may develop or continue to have heartburn as a result of your pregnancy.  You may develop constipation because certain hormones are causing the muscles that push waste through your intestines to slow down.  You may develop hemorrhoids or swollen, bulging veins (varicose veins).  You may have back pain. This is caused by: ? Weight gain. ? Pregnancy hormones that are relaxing the joints in your pelvis. ? A shift in weight and the muscles that support your balance.  Your breasts will continue to grow and they will continue to become tender.  Your gums may bleed and may be sensitive to brushing and flossing.  Dark spots or blotches (chloasma, mask of pregnancy) may develop on your face. This will likely fade after the baby is born.  A dark line from  your belly button to the pubic area (linea nigra) may appear. This will likely fade after the baby is born.  You may have changes in your hair. These can include thickening of your hair, rapid growth, and changes in texture. Some women also have hair loss during or after pregnancy, or hair that feels dry or thin. Your hair will most likely return to normal after your baby is born.  What to expect at prenatal visits During a routine prenatal visit:  You will be weighed to make sure you and the fetus are growing normally.  Your blood pressure will be taken.  Your abdomen will be measured to track your baby's growth.  The fetal heartbeat will be listened to.  Any test results from the previous visit will be discussed.  Your health care provider may ask you:  How you are feeling.  If you are feeling the baby move.  If you have had any abnormal symptoms, such as leaking fluid, bleeding, severe headaches, or abdominal cramping.  If you are using any tobacco products, including cigarettes, chewing tobacco, and electronic cigarettes.  If you have any questions.  Other tests that may be performed during your second trimester include:  Blood tests that check for: ? Low iron levels (anemia). ? High blood sugar that affects pregnant women (gestational diabetes) between 24 and 28 weeks. ? Rh antibodies. This is to check for a protein on red blood cells (Rh factor).  Urine tests to check for infections, diabetes,   or protein in the urine.  An ultrasound to confirm the proper growth and development of the baby.  An amniocentesis to check for possible genetic problems.  Fetal screens for spina bifida and Down syndrome.  HIV (human immunodeficiency virus) testing. Routine prenatal testing includes screening for HIV, unless you choose not to have this test.  Follow these instructions at home: Medicines  Follow your health care provider's instructions regarding medicine use. Specific  medicines may be either safe or unsafe to take during pregnancy.  Take a prenatal vitamin that contains at least 600 micrograms (mcg) of folic acid.  If you develop constipation, try taking a stool softener if your health care provider approves. Eating and drinking  Eat a balanced diet that includes fresh fruits and vegetables, whole grains, good sources of protein such as meat, eggs, or tofu, and low-fat dairy. Your health care provider will help you determine the amount of weight gain that is right for you.  Avoid raw meat and uncooked cheese. These carry germs that can cause birth defects in the baby.  If you have low calcium intake from food, talk to your health care provider about whether you should take a daily calcium supplement.  Limit foods that are high in fat and processed sugars, such as fried and sweet foods.  To prevent constipation: ? Drink enough fluid to keep your urine clear or pale yellow. ? Eat foods that are high in fiber, such as fresh fruits and vegetables, whole grains, and beans. Activity  Exercise only as directed by your health care provider. Most women can continue their usual exercise routine during pregnancy. Try to exercise for 30 minutes at least 5 days a week. Stop exercising if you experience uterine contractions.  Avoid heavy lifting, wear low heel shoes, and practice good posture.  A sexual relationship may be continued unless your health care provider directs you otherwise. Relieving pain and discomfort  Wear a good support bra to prevent discomfort from breast tenderness.  Take warm sitz baths to soothe any pain or discomfort caused by hemorrhoids. Use hemorrhoid cream if your health care provider approves.  Rest with your legs elevated if you have leg cramps or low back pain.  If you develop varicose veins, wear support hose. Elevate your feet for 15 minutes, 3-4 times a day. Limit salt in your diet. Prenatal Care  Write down your questions.  Take them to your prenatal visits.  Keep all your prenatal visits as told by your health care provider. This is important. Safety  Wear your seat belt at all times when driving.  Make a list of emergency phone numbers, including numbers for family, friends, the hospital, and police and fire departments. General instructions  Ask your health care provider for a referral to a local prenatal education class. Begin classes no later than the beginning of month 6 of your pregnancy.  Ask for help if you have counseling or nutritional needs during pregnancy. Your health care provider can offer advice or refer you to specialists for help with various needs.  Do not use hot tubs, steam rooms, or saunas.  Do not douche or use tampons or scented sanitary pads.  Do not cross your legs for long periods of time.  Avoid cat litter boxes and soil used by cats. These carry germs that can cause birth defects in the baby and possibly loss of the fetus by miscarriage or stillbirth.  Avoid all smoking, herbs, alcohol, and unprescribed drugs. Chemicals in these products   can affect the formation and growth of the baby.  Do not use any products that contain nicotine or tobacco, such as cigarettes and e-cigarettes. If you need help quitting, ask your health care provider.  Visit your dentist if you have not gone yet during your pregnancy. Use a soft toothbrush to brush your teeth and be gentle when you floss. Contact a health care provider if:  You have dizziness.  You have mild pelvic cramps, pelvic pressure, or nagging pain in the abdominal area.  You have persistent nausea, vomiting, or diarrhea.  You have a bad smelling vaginal discharge.  You have pain when you urinate. Get help right away if:  You have a fever.  You are leaking fluid from your vagina.  You have spotting or bleeding from your vagina.  You have severe abdominal cramping or pain.  You have rapid weight gain or weight  loss.  You have shortness of breath with chest pain.  You notice sudden or extreme swelling of your face, hands, ankles, feet, or legs.  You have not felt your baby move in over an hour.  You have severe headaches that do not go away when you take medicine.  You have vision changes. Summary  The second trimester is from week 14 through week 27 (months 4 through 6). It is also a time when the fetus is growing rapidly.  Your body goes through many changes during pregnancy. The changes vary from woman to woman.  Avoid all smoking, herbs, alcohol, and unprescribed drugs. These chemicals affect the formation and growth your baby.  Do not use any tobacco products, such as cigarettes, chewing tobacco, and e-cigarettes. If you need help quitting, ask your health care provider.  Contact your health care provider if you have any questions. Keep all prenatal visits as told by your health care provider. This is important. This information is not intended to replace advice given to you by your health care provider. Make sure you discuss any questions you have with your health care provider. Document Released: 02/22/2001 Document Revised: 08/06/2015 Document Reviewed: 05/01/2012 Elsevier Interactive Patient Education  2017 [employer]   Breastfeeding Deciding to breastfeed is one of the best choices you can make for you and your baby. A change in hormones during pregnancy causes your breast tissue to grow and increases the number and size of your milk ducts. These hormones also allow proteins, sugars, and fats from your blood supply to make breast milk in your milk-producing glands. Hormones prevent breast milk from being released before your baby is born as well as prompt milk flow after birth. Once breastfeeding has begun, thoughts of your baby, as well as his or her sucking or crying, can stimulate the release of milk from your milk-producing glands. Benefits of breastfeeding For Your  Baby  Your first milk (colostrum) helps your baby's digestive system function better.  There are antibodies in your milk that help your baby fight off infections.  Your baby has a lower incidence of asthma, allergies, and sudden infant death syndrome.  The nutrients in breast milk are better for your baby than infant formulas and are designed uniquely for your baby's needs.  Breast milk improves your baby's brain development.  Your baby is less likely to develop other conditions, such as childhood obesity, asthma, or type 2 diabetes mellitus.  For You  Breastfeeding helps to create a very special bond between you and your baby.  Breastfeeding is convenient. Breast milk is always available at   the correct temperature and costs nothing.  Breastfeeding helps to burn calories and helps you lose the weight gained during pregnancy.  Breastfeeding makes your uterus contract to its prepregnancy size faster and slows bleeding (lochia) after you give birth.  Breastfeeding helps to lower your risk of developing type 2 diabetes mellitus, osteoporosis, and breast or ovarian cancer later in life.  Signs that your baby is hungry Early Signs of Hunger  Increased alertness or activity.  Stretching.  Movement of the head from side to side.  Movement of the head and opening of the mouth when the corner of the mouth or cheek is stroked (rooting).  Increased sucking sounds, smacking lips, cooing, sighing, or squeaking.  Hand-to-mouth movements.  Increased sucking of fingers or hands.  Late Signs of Hunger  Fussing.  Intermittent crying.  Extreme Signs of Hunger Signs of extreme hunger will require calming and consoling before your baby will be able to breastfeed successfully. Do not wait for the following signs of extreme hunger to occur before you initiate breastfeeding:  Restlessness.  A loud, strong cry.  Screaming.  Breastfeeding basics Breastfeeding Initiation  Find a  comfortable place to sit or lie down, with your neck and back well supported.  Place a pillow or rolled up blanket under your baby to bring him or her to the level of your breast (if you are seated). Nursing pillows are specially designed to help support your arms and your baby while you breastfeed.  Make sure that your baby's abdomen is facing your abdomen.  Gently massage your breast. With your fingertips, massage from your chest wall toward your nipple in a circular motion. This encourages milk flow. You may need to continue this action during the feeding if your milk flows slowly.  Support your breast with 4 fingers underneath and your thumb above your nipple. Make sure your fingers are well away from your nipple and your baby's mouth.  Stroke your baby's lips gently with your finger or nipple.  When your baby's mouth is open wide enough, quickly bring your baby to your breast, placing your entire nipple and as much of the colored area around your nipple (areola) as possible into your baby's mouth. ? More areola should be visible above your baby's upper lip than below the lower lip. ? Your baby's tongue should be between his or her lower gum and your breast.  Ensure that your baby's mouth is correctly positioned around your nipple (latched). Your baby's lips should create a seal on your breast and be turned out (everted).  It is common for your baby to suck about 2-3 minutes in order to start the flow of breast milk.  Latching Teaching your baby how to latch on to your breast properly is very important. An improper latch can cause nipple pain and decreased milk supply for you and poor weight gain in your baby. Also, if your baby is not latched onto your nipple properly, he or she may swallow some air during feeding. This can make your baby fussy. Burping your baby when you switch breasts during the feeding can help to get rid of the air. However, teaching your baby to latch on properly is  still the best way to prevent fussiness from swallowing air while breastfeeding. Signs that your baby has successfully latched on to your nipple:  Silent tugging or silent sucking, without causing you pain.  Swallowing heard between every 3-4 sucks.  Muscle movement above and in front of his or her   ears while sucking.  Signs that your baby has not successfully latched on to nipple:  Sucking sounds or smacking sounds from your baby while breastfeeding.  Nipple pain.  If you think your baby has not latched on correctly, slip your finger into the corner of your baby's mouth to break the suction and place it between your baby's gums. Attempt breastfeeding initiation again. Signs of Successful Breastfeeding Signs from your baby:  A gradual decrease in the number of sucks or complete cessation of sucking.  Falling asleep.  Relaxation of his or her body.  Retention of a small amount of milk in his or her mouth.  Letting go of your breast by himself or herself.  Signs from you:  Breasts that have increased in firmness, weight, and size 1-3 hours after feeding.  Breasts that are softer immediately after breastfeeding.  Increased milk volume, as well as a change in milk consistency and color by the fifth day of breastfeeding.  Nipples that are not sore, cracked, or bleeding.  Signs That Your Baby is Getting Enough Milk  Wetting at least 1-2 diapers during the first 24 hours after birth.  Wetting at least 5-6 diapers every 24 hours for the first week after birth. The urine should be clear or pale yellow by 5 days after birth.  Wetting 6-8 diapers every 24 hours as your baby continues to grow and develop.  At least 3 stools in a 24-hour period by age 5 days. The stool should be soft and yellow.  At least 3 stools in a 24-hour period by age 7 days. The stool should be seedy and yellow.  No loss of weight greater than 10% of birth weight during the first 3 days of age.  Average  weight gain of 4-7 ounces (113-198 g) per week after age 4 days.  Consistent daily weight gain by age 5 days, without weight loss after the age of 2 weeks.  After a feeding, your baby may spit up a small amount. This is common. Breastfeeding frequency and duration Frequent feeding will help you make more milk and can prevent sore nipples and breast engorgement. Breastfeed when you feel the need to reduce the fullness of your breasts or when your baby shows signs of hunger. This is called "breastfeeding on demand." Avoid introducing a pacifier to your baby while you are working to establish breastfeeding (the first 4-6 weeks after your baby is born). After this time you may choose to use a pacifier. Research has shown that pacifier use during the first year of a baby's life decreases the risk of sudden infant death syndrome (SIDS). Allow your baby to feed on each breast as long as he or she wants. Breastfeed until your baby is finished feeding. When your baby unlatches or falls asleep while feeding from the first breast, offer the second breast. Because newborns are often sleepy in the first few weeks of life, you may need to awaken your baby to get him or her to feed. Breastfeeding times will vary from baby to baby. However, the following rules can serve as a guide to help you ensure that your baby is properly fed:  Newborns (babies 4 weeks of age or younger) may breastfeed every 1-3 hours.  Newborns should not go longer than 3 hours during the day or 5 hours during the night without breastfeeding.  You should breastfeed your baby a minimum of 8 times in a 24-hour period until you begin to introduce solid foods to your   baby at around 6 months of age.  Breast milk pumping Pumping and storing breast milk allows you to ensure that your baby is exclusively fed your breast milk, even at times when you are unable to breastfeed. This is especially important if you are going back to work while you are still  breastfeeding or when you are not able to be present during feedings. Your lactation consultant can give you guidelines on how long it is safe to store breast milk. A breast pump is a machine that allows you to pump milk from your breast into a sterile bottle. The pumped breast milk can then be stored in a refrigerator or freezer. Some breast pumps are operated by hand, while others use electricity. Ask your lactation consultant which type will work best for you. Breast pumps can be purchased, but some hospitals and breastfeeding support groups lease breast pumps on a monthly basis. A lactation consultant can teach you how to hand express breast milk, if you prefer not to use a pump. Caring for your breasts while you breastfeed Nipples can become dry, cracked, and sore while breastfeeding. The following recommendations can help keep your breasts moisturized and healthy:  Avoid using soap on your nipples.  Wear a supportive bra. Although not required, special nursing bras and tank tops are designed to allow access to your breasts for breastfeeding without taking off your entire bra or top. Avoid wearing underwire-style bras or extremely tight bras.  Air dry your nipples for 3-4minutes after each feeding.  Use only cotton bra pads to absorb leaked breast milk. Leaking of breast milk between feedings is normal.  Use lanolin on your nipples after breastfeeding. Lanolin helps to maintain your skin's normal moisture barrier. If you use pure lanolin, you do not need to wash it off before feeding your baby again. Pure lanolin is not toxic to your baby. You may also hand express a few drops of breast milk and gently massage that milk into your nipples and allow the milk to air dry.  In the first few weeks after giving birth, some women experience extremely full breasts (engorgement). Engorgement can make your breasts feel heavy, warm, and tender to the touch. Engorgement peaks within 3-5 days after you give  birth. The following recommendations can help ease engorgement:  Completely empty your breasts while breastfeeding or pumping. You may want to start by applying warm, moist heat (in the shower or with warm water-soaked hand towels) just before feeding or pumping. This increases circulation and helps the milk flow. If your baby does not completely empty your breasts while breastfeeding, pump any extra milk after he or she is finished.  Wear a snug bra (nursing or regular) or tank top for 1-2 days to signal your body to slightly decrease milk production.  Apply ice packs to your breasts, unless this is too uncomfortable for you.  Make sure that your baby is latched on and positioned properly while breastfeeding.  If engorgement persists after 48 hours of following these recommendations, contact your health care provider or a lactation consultant. Overall health care recommendations while breastfeeding  Eat healthy foods. Alternate between meals and snacks, eating 3 of each per day. Because what you eat affects your breast milk, some of the foods may make your baby more irritable than usual. Avoid eating these foods if you are sure that they are negatively affecting your baby.  Drink milk, fruit juice, and water to satisfy your thirst (about 10 glasses a day).    Rest often, relax, and continue to take your prenatal vitamins to prevent fatigue, stress, and anemia.  Continue breast self-awareness checks.  Avoid chewing and smoking tobacco. Chemicals from cigarettes that pass into breast milk and exposure to secondhand smoke may harm your baby.  Avoid alcohol and drug use, including marijuana. Some medicines that may be harmful to your baby can pass through breast milk. It is important to ask your health care provider before taking any medicine, including all over-the-counter and prescription medicine as well as vitamin and herbal supplements. It is possible to become pregnant while breastfeeding.  If birth control is desired, ask your health care provider about options that will be safe for your baby. Contact a health care provider if:  You feel like you want to stop breastfeeding or have become frustrated with breastfeeding.  You have painful breasts or nipples.  Your nipples are cracked or bleeding.  Your breasts are red, tender, or warm.  You have a swollen area on either breast.  You have a fever or chills.  You have nausea or vomiting.  You have drainage other than breast milk from your nipples.  Your breasts do not become full before feedings by the fifth day after you give birth.  You feel sad and depressed.  Your baby is too sleepy to eat well.  Your baby is having trouble sleeping.  Your baby is wetting less than 3 diapers in a 24-hour period.  Your baby has less than 3 stools in a 24-hour period.  Your baby's skin or the white part of his or her eyes becomes yellow.  Your baby is not gaining weight by 5 days of age. Get help right away if:  Your baby is overly tired (lethargic) and does not want to wake up and feed.  Your baby develops an unexplained fever. This information is not intended to replace advice given to you by your health care provider. Make sure you discuss any questions you have with your health care provider. Document Released: 02/28/2005 Document Revised: 08/12/2015 Document Reviewed: 08/22/2012 Elsevier Interactive Patient Education  2017 [employer].  

## 2016-08-22 NOTE — Progress Notes (Signed)
   PRENATAL VISIT NOTE  Subjective:  Alicia Castro is a 31 y.o. [phone] [id number] at 8572w4d being seen today for ongoing prenatal care.  She is currently monitored for the following issues for this high-risk pregnancy and has Headache; PRES (posterior reversible encephalopathy syndrome); Supervision of high risk pregnancy, antepartum; History of pre-eclampsia in prior pregnancy, currently pregnant; History of preterm delivery, currently pregnant; and History of stillbirth in currently pregnant patient on her problem list.  Patient reports no complaints.  Contractions: Not present. Vag. Bleeding: None.  Movement: Present. Denies leaking of fluid.   The following portions of the patient's history were reviewed and updated as appropriate: allergies, current medications, past family history, past medical history, past social history, past surgical history and problem list. Problem list updated.  Objective:   Vitals:   08/22/16 1033  BP: 117/72  Pulse: 86  Weight: 193 lb 3.4 oz (87.6 kg)    Fetal Status: Fetal Heart Rate (bpm): 145 Fundal Height: 24 cm Movement: Present     General:  Alert, oriented and cooperative. Patient is in no acute distress.  Skin: Skin is warm and dry. No rash noted.   Cardiovascular: Normal heart rate noted  Respiratory: Normal respiratory effort, no problems with respiration noted  Abdomen: Soft, gravid, appropriate for gestational age. Pain/Pressure: Present     Pelvic:  Cervical exam deferred        Extremities: Normal range of motion.  Edema: Trace  Mental Status: Normal mood and affect. Normal behavior. Normal judgment and thought content.   Assessment and Plan:  Pregnancy: [id number] at 6672w4d  1. History of preterm delivery, currently pregnant Continue 17 P - hydroxyprogesterone caproate (MAKENA) 250 mg/mL injection 250 mg; Inject 1 mL (250 mg total) into the muscle once.  2. History of pre-eclampsia in prior pregnancy, currently pregnant Continue ASA -  hydroxyprogesterone caproate (MAKENA) 250 mg/mL injection 250 mg; Inject 1 mL (250 mg total) into the muscle once.  3. History of stillbirth in pregnant patient in second trimester, antepartum U/s for growth and serially 2x/wk testing at 32 wks. - US MFM OB FOLLOW UP; Future - hydroxyprogesterone caproate (MAKENA) 250 mg/mL injection 250 mg; Inject 1 mL (250 mg total) into the muscle once.  Preterm labor symptoms and general obstetric precautions including but not limited to vaginal bleeding, contractions, leaking of fluid and fetal movement were reviewed in detail with the patient. Please refer to After Visit Summary for other counseling recommendations.  Return in 4 weeks (on 09/19/2016) for HRC, 28 wk labs, 17 P weekly.   Reva Boresanya S Mykhia Danish, MD

## 2016-08-29 ENCOUNTER — Ambulatory Visit (INDEPENDENT_AMBULATORY_CARE_PROVIDER_SITE_OTHER): Payer: Self-pay | Admitting: *Deleted

## 2016-08-29 VITALS — BP 91/49 | HR 99

## 2016-08-29 DIAGNOSIS — O09212 Supervision of pregnancy with history of pre-term labor, second trimester: Secondary | ICD-10-CM

## 2016-08-29 DIAGNOSIS — O09899 Supervision of other high risk pregnancies, unspecified trimester: Secondary | ICD-10-CM

## 2016-08-29 DIAGNOSIS — O09219 Supervision of pregnancy with history of pre-term labor, unspecified trimester: Secondary | ICD-10-CM

## 2016-09-05 ENCOUNTER — Ambulatory Visit (INDEPENDENT_AMBULATORY_CARE_PROVIDER_SITE_OTHER): Payer: Self-pay | Admitting: *Deleted

## 2016-09-05 VITALS — BP 101/55 | HR 79

## 2016-09-05 DIAGNOSIS — O09219 Supervision of pregnancy with history of pre-term labor, unspecified trimester: Principal | ICD-10-CM

## 2016-09-05 DIAGNOSIS — O09899 Supervision of other high risk pregnancies, unspecified trimester: Secondary | ICD-10-CM

## 2016-09-05 DIAGNOSIS — O09212 Supervision of pregnancy with history of pre-term labor, second trimester: Secondary | ICD-10-CM

## 2016-09-05 MED ORDER — HYDROXYPROGESTERONE CAPROATE 250 MG/ML IM OIL
250.0000 mg | TOPICAL_OIL | Freq: Once | INTRAMUSCULAR | Status: AC
  Administered 2016-09-05: 250 mg via INTRAMUSCULAR

## 2016-09-12 ENCOUNTER — Ambulatory Visit: Payer: Self-pay

## 2016-09-19 ENCOUNTER — Encounter: Payer: Self-pay | Admitting: Obstetrics & Gynecology

## 2016-09-19 ENCOUNTER — Ambulatory Visit (HOSPITAL_COMMUNITY)
Admission: RE | Admit: 2016-09-19 | Discharge: 2016-09-19 | Disposition: A | Discharge status: Home / Self Care | Payer: Medicaid Other | Source: Ambulatory Visit | Attending: Family Medicine | Admitting: Family Medicine

## 2016-09-19 ENCOUNTER — Ambulatory Visit (INDEPENDENT_AMBULATORY_CARE_PROVIDER_SITE_OTHER): Payer: Medicaid Other | Admitting: Obstetrics & Gynecology

## 2016-09-19 ENCOUNTER — Encounter (HOSPITAL_COMMUNITY): Payer: Self-pay

## 2016-09-19 VITALS — BP 96/56 | HR 78 | Wt 194.5 lb

## 2016-09-19 DIAGNOSIS — Z3A28 28 weeks gestation of pregnancy: Secondary | ICD-10-CM | POA: Diagnosis not present

## 2016-09-19 DIAGNOSIS — O2203 Varicose veins of lower extremity in pregnancy, third trimester: Secondary | ICD-10-CM | POA: Diagnosis not present

## 2016-09-19 DIAGNOSIS — O09213 Supervision of pregnancy with history of pre-term labor, third trimester: Secondary | ICD-10-CM | POA: Diagnosis not present

## 2016-09-19 DIAGNOSIS — O09293 Supervision of pregnancy with other poor reproductive or obstetric history, third trimester: Secondary | ICD-10-CM | POA: Insufficient documentation

## 2016-09-19 DIAGNOSIS — O09299 Supervision of pregnancy with other poor reproductive or obstetric history, unspecified trimester: Secondary | ICD-10-CM

## 2016-09-19 DIAGNOSIS — Z113 Encounter for screening for infections with a predominantly sexual mode of transmission: Secondary | ICD-10-CM

## 2016-09-19 DIAGNOSIS — O26893 Other specified pregnancy related conditions, third trimester: Secondary | ICD-10-CM | POA: Diagnosis not present

## 2016-09-19 DIAGNOSIS — O99213 Obesity complicating pregnancy, third trimester: Secondary | ICD-10-CM | POA: Insufficient documentation

## 2016-09-19 DIAGNOSIS — O26899 Other specified pregnancy related conditions, unspecified trimester: Secondary | ICD-10-CM

## 2016-09-19 DIAGNOSIS — O99333 Smoking (tobacco) complicating pregnancy, third trimester: Secondary | ICD-10-CM | POA: Diagnosis not present

## 2016-09-19 DIAGNOSIS — O09292 Supervision of pregnancy with other poor reproductive or obstetric history, second trimester: Secondary | ICD-10-CM

## 2016-09-19 DIAGNOSIS — O2200 Varicose veins of lower extremity in pregnancy, unspecified trimester: Secondary | ICD-10-CM

## 2016-09-19 DIAGNOSIS — O0990 Supervision of high risk pregnancy, unspecified, unspecified trimester: Secondary | ICD-10-CM

## 2016-09-19 DIAGNOSIS — N898 Other specified noninflammatory disorders of vagina: Secondary | ICD-10-CM

## 2016-09-19 MED ORDER — BETAMETHASONE SOD PHOS & ACET 6 (3-3) MG/ML IJ SUSP
12.0000 mg | Freq: Once | INTRAMUSCULAR | Status: AC
  Administered 2016-09-19: 12 mg via INTRAMUSCULAR

## 2016-09-19 MED ORDER — HYDROXYPROGESTERONE CAPROATE 250 MG/ML IM OIL
250.0000 mg | TOPICAL_OIL | Freq: Once | INTRAMUSCULAR | Status: AC
  Administered 2016-09-19: 250 mg via INTRAMUSCULAR

## 2016-09-19 MED ORDER — BETAMETHASONE SOD PHOS & ACET 6 (3-3) MG/ML IJ SUSP: 12.0000 mg | Freq: Once | INTRAMUSCULAR | Status: DC

## 2016-09-19 NOTE — Addendum Note (Signed)
Addended by: Faythe CasaBELLAMY, Gadiel John M on: 09/19/2016 03:39 PM   Modules accepted: Orders

## 2016-09-19 NOTE — ED Notes (Signed)
Pt reports a small amt of bleeding 1 week ago after swimming, denies pain, no bleeding today.

## 2016-09-19 NOTE — Progress Notes (Signed)
   PRENATAL VISIT NOTE  Subjective:  Alicia Castro is a 31 y.o. [phone] [id number] at 3229w4d being seen today for ongoing prenatal care.  She is currently monitored for the following issues for this high-risk pregnancy and has Headache; PRES (posterior reversible encephalopathy syndrome); Supervision of high risk pregnancy, antepartum; History of pre-eclampsia in prior pregnancy, currently pregnant; History of preterm delivery, currently pregnant; and History of stillbirth in currently pregnant patient on her problem list.  Patient reports vaginal discharge and odor; spotting after intercourse 1 week ago.   .  .  Movement: Present. Denies leaking of fluid.   The following portions of the patient's history were reviewed and updated as appropriate: allergies, current medications, past family history, past medical history, past social history, past surgical history and problem list. Problem list updated.  Objective:   Vitals:   09/19/16 1101  BP: (!) 96/56  Pulse: 78  Weight: 194 lb 8 oz (88.2 kg)    Fetal Status: Fetal Heart Rate (bpm): 134   Movement: Present     General:  Alert, oriented and cooperative. Patient is in no acute distress.  Skin: Skin is warm and dry. No rash noted.   Cardiovascular: Normal heart rate noted  Respiratory: Normal respiratory effort, no problems with respiration noted  Abdomen: Soft, gravid, appropriate for gestational age. Pain/Pressure: Present     Pelvic:  Cervical exam performed      1/50/-3  Extremities: Normal range of motion.  Edema: None  Mental Status: Normal mood and affect. Normal behavior. Normal judgment and thought content.   Assessment and Plan:  Pregnancy: [id number] at 629w4d  1. Supervision of high risk pregnancy, antepartum Tdap. C/o change in discharge-minimal discharge Cervix 1/50/-3, vertex Betamethasone today then rpt in 24 hours with ffn BTL papers  2. History of stillbirth in pregnant patient in third trimester, antepartum Pt  believes it was sue to PPROM that wasn't recognized and then infection caused the demise.  Pt had autopsy.  Pt got US for grwoth at 28 weeks (today) per MFM.  3. History of pre-eclampsia in prior pregnancy, currently pregnant BP nml; watch closely.  4.  LE mass Soft tissue US; treat based on results. (anterior LLE near ankle).  No evidence of DVT.    Preterm labor symptoms and general obstetric precautions including but not limited to vaginal bleeding, contractions, leaking of fluid and fetal movement were reviewed in detail with the patient. Please refer to After Visit Summary for other counseling recommendations.   1 day for Cervical exam, Betamethasone, and FFN  Elsie LincolnKelly Veyda Kaufman, MD

## 2016-09-19 NOTE — Progress Notes (Signed)
Soft Tissue US of LE scheduled for July 19th @ 0800 [facility].  Pt notified.

## 2016-09-20 ENCOUNTER — Ambulatory Visit (INDEPENDENT_AMBULATORY_CARE_PROVIDER_SITE_OTHER): Payer: Medicaid Other

## 2016-09-20 ENCOUNTER — Other Ambulatory Visit (HOSPITAL_COMMUNITY): Payer: Self-pay | Admitting: *Deleted

## 2016-09-20 DIAGNOSIS — O09213 Supervision of pregnancy with history of pre-term labor, third trimester: Secondary | ICD-10-CM | POA: Diagnosis not present

## 2016-09-20 DIAGNOSIS — O09293 Supervision of pregnancy with other poor reproductive or obstetric history, third trimester: Secondary | ICD-10-CM

## 2016-09-20 DIAGNOSIS — Z23 Encounter for immunization: Secondary | ICD-10-CM | POA: Diagnosis not present

## 2016-09-20 DIAGNOSIS — O0990 Supervision of high risk pregnancy, unspecified, unspecified trimester: Secondary | ICD-10-CM

## 2016-09-20 LAB — CBC
Hematocrit: 31.1 % — ABNORMAL LOW (ref 34.0–46.6)
Hemoglobin: 10.1 g/dL — ABNORMAL LOW (ref 11.1–15.9)
MCH: 30.5 pg (ref 26.6–33.0)
MCHC: 32.5 g/dL (ref 31.5–35.7)
MCV: 94 fL (ref 79–97)
Platelets: 217 10*3/uL (ref 150–379)
RBC: 3.31 x10E6/uL — ABNORMAL LOW (ref 3.77–5.28)
RDW: 14.4 % (ref 12.3–15.4)
WBC: 5.8 10*3/uL (ref 3.4–10.8)

## 2016-09-20 LAB — GLUCOSE TOLERANCE, 2 HOURS W/ 1HR
GLUCOSE, 2 HOUR: 94 mg/dL (ref 65–152)
Glucose, 1 hour: 104 mg/dL (ref 65–179)
Glucose, Fasting: 69 mg/dL (ref 65–91)

## 2016-09-20 LAB — RPR: RPR Ser Ql: NONREACTIVE

## 2016-09-20 LAB — CERVICOVAGINAL ANCILLARY ONLY
Bacterial vaginitis: POSITIVE — AB
CHLAMYDIA, DNA PROBE: NEGATIVE
Candida vaginitis: NEGATIVE
NEISSERIA GONORRHEA: NEGATIVE
Trichomonas: NEGATIVE

## 2016-09-20 LAB — HIV ANTIBODY (ROUTINE TESTING W REFLEX): HIV SCREEN 4TH GENERATION: NONREACTIVE

## 2016-09-20 MED ORDER — BETAMETHASONE SOD PHOS & ACET 6 (3-3) MG/ML IJ SUSP
12.0000 mg | Freq: Once | INTRAMUSCULAR | Status: AC
  Administered 2016-09-20: 12 mg via INTRAMUSCULAR

## 2016-09-20 NOTE — Progress Notes (Signed)
Patient presented to the office today for her second betamethasone injection and tdap. BTL papers have been sign today as well.

## 2016-09-23 ENCOUNTER — Other Ambulatory Visit: Payer: Self-pay

## 2016-09-23 MED ORDER — METRONIDAZOLE 500 MG PO TABS: 500.0000 mg | ORAL_TABLET | Freq: Two times a day (BID) | ORAL | 0 refills | Status: DC

## 2016-09-23 NOTE — Telephone Encounter (Signed)
Patient has BV and will need to be treated with Flagyl. Called patient no answer I will go ahead and call in Flagyl.

## 2016-09-26 ENCOUNTER — Ambulatory Visit: Payer: Self-pay

## 2016-09-29 ENCOUNTER — Ambulatory Visit (HOSPITAL_COMMUNITY)
Admission: RE | Admit: 2016-09-29 | Discharge: 2016-09-29 | Disposition: A | Discharge status: Home / Self Care | Payer: Medicaid Other | Source: Ambulatory Visit | Attending: Obstetrics & Gynecology | Admitting: Obstetrics & Gynecology

## 2016-09-29 DIAGNOSIS — Z3A00 Weeks of gestation of pregnancy not specified: Secondary | ICD-10-CM | POA: Diagnosis not present

## 2016-09-29 DIAGNOSIS — O2200 Varicose veins of lower extremity in pregnancy, unspecified trimester: Secondary | ICD-10-CM | POA: Diagnosis present

## 2016-10-03 ENCOUNTER — Ambulatory Visit: Payer: Self-pay

## 2016-10-10 ENCOUNTER — Encounter: Payer: Self-pay | Admitting: Obstetrics & Gynecology

## 2016-10-12 ENCOUNTER — Ambulatory Visit (INDEPENDENT_AMBULATORY_CARE_PROVIDER_SITE_OTHER): Payer: Medicaid Other | Admitting: Obstetrics and Gynecology

## 2016-10-12 VITALS — BP 104/56 | HR 91 | Wt 196.7 lb

## 2016-10-12 DIAGNOSIS — O09219 Supervision of pregnancy with history of pre-term labor, unspecified trimester: Secondary | ICD-10-CM

## 2016-10-12 DIAGNOSIS — O09293 Supervision of pregnancy with other poor reproductive or obstetric history, third trimester: Secondary | ICD-10-CM

## 2016-10-12 DIAGNOSIS — O09213 Supervision of pregnancy with history of pre-term labor, third trimester: Secondary | ICD-10-CM

## 2016-10-12 DIAGNOSIS — O0990 Supervision of high risk pregnancy, unspecified, unspecified trimester: Secondary | ICD-10-CM

## 2016-10-12 DIAGNOSIS — D649 Anemia, unspecified: Secondary | ICD-10-CM

## 2016-10-12 DIAGNOSIS — O99019 Anemia complicating pregnancy, unspecified trimester: Secondary | ICD-10-CM | POA: Insufficient documentation

## 2016-10-12 DIAGNOSIS — O0993 Supervision of high risk pregnancy, unspecified, third trimester: Secondary | ICD-10-CM

## 2016-10-12 DIAGNOSIS — O09899 Supervision of other high risk pregnancies, unspecified trimester: Secondary | ICD-10-CM

## 2016-10-12 DIAGNOSIS — O09299 Supervision of pregnancy with other poor reproductive or obstetric history, unspecified trimester: Secondary | ICD-10-CM

## 2016-10-12 DIAGNOSIS — O99013 Anemia complicating pregnancy, third trimester: Secondary | ICD-10-CM

## 2016-10-12 MED ORDER — FERROUS SULFATE 325 (65 FE) MG PO TABS: 325.0000 mg | ORAL_TABLET | Freq: Every day | ORAL | 2 refills | Status: DC

## 2016-10-12 MED ORDER — METRONIDAZOLE 500 MG PO TABS: 500.0000 mg | ORAL_TABLET | Freq: Two times a day (BID) | ORAL | 0 refills | Status: DC

## 2016-10-12 NOTE — Patient Instructions (Signed)

## 2016-10-12 NOTE — Progress Notes (Signed)
[facility] with Toniann FailWendy, pharmacist, who informed me that pt's Medicaid insurance is rejecting the refill or states that the medication is not covered.  Informed the pt of pharmacist findings.  Pt called her Medicaid case worker who informed the pt that her medicaid will be activated tomorrow, 10/13/16.  Toniann FailWendy informed me that they will attempt to file pt's insurance for refill on pt's Makena on Friday, 10/14/16 to see if it is accepts.  I informed pt that when her insurance accepts her insurance we will call her when we receive her medication.  Pt stated understanding with no further questions.

## 2016-10-12 NOTE — Progress Notes (Signed)
Subjective:  Alicia Castro is a [age] y.o. [phone] at 972w6d being seen today for ongoing prenatal care.  She is currently monitored for the following issues for this high-risk pregnancy and has Headache; PRES (posterior reversible encephalopathy syndrome); Supervision of high risk pregnancy, antepartum; History of pre-eclampsia in prior pregnancy, currently pregnant; History of preterm delivery, currently pregnant; History of stillbirth in currently pregnant patient; and Anemia in pregnancy on her problem list.  Patient reports no complaints.  Contractions: Not present. Vag. Bleeding: None.  Movement: Present. Denies leaking of fluid.   The following portions of the patient's history were reviewed and updated as appropriate: allergies, current medications, past family history, past medical history, past social history, past surgical history and problem list. Problem list updated.  Objective:  BP (!) 104/56   Pulse 91   Wt 196 lb 11.2 oz (89.2 kg)   LMP 02/06/2016   BMI 32.23 kg/m   Fetal Status: Fetal Heart Rate (bpm): 152 Fundal Height: 31 cm Movement: Present     General:  Alert, oriented and cooperative. Patient is in no acute distress.  Skin: Skin is warm and dry. No rash noted.   Cardiovascular: Normal heart rate noted  Respiratory: Normal respiratory effort, no problems with respiration noted  Abdomen: Soft, gravid, appropriate for gestational age. Pain/Pressure: Present     Pelvic: Vag. Bleeding: None Vag D/C Character: White   Cervical exam deferred        Extremities: Normal range of motion.  Edema: Trace  Mental Status: Normal mood and affect. Normal behavior. Normal judgment and thought content.    Assessment and Plan:  Pregnancy: [id number] at 3872w6d  1. Supervision of high risk pregnancy, antepartum Routine prenatal care. US for growth already scheduled. Start antepartum testing.   2. History of pre-eclampsia in prior pregnancy, currently pregnant Continue ASA. BPs are  stable.   3. History of preterm delivery, currently pregnant Was supposed to be on 17P but missed last couple of doses. Insurance has rejected filling. Patient does not have active Medicaid at this time. Restart when able.   4. Anemia during pregnancy in third trimester Noted on 28wk labs. Will start iron supplementation.   Refilled Flagyl since aptient did not take.  Preterm labor symptoms and general obstetric precautions including but not limited to vaginal bleeding, contractions, leaking of fluid and fetal movement were reviewed in detail with the patient. Please refer to After Visit Summary for other counseling recommendations.  Return in about 1 week (around 10/19/2016) for ob visit.  Caryl AdaJazma Phelps, DO OB Fellow Faculty Practice, [facility] [place] 10/12/2016, 1:58 PM

## 2016-10-15 ENCOUNTER — Encounter (HOSPITAL_COMMUNITY): Payer: Self-pay

## 2016-10-15 ENCOUNTER — Inpatient Hospital Stay (HOSPITAL_COMMUNITY)
Admission: AD | Admit: 2016-10-15 | Discharge: 2016-10-23 | DRG: 767 | Disposition: A | Discharge status: Home / Self Care | Payer: Medicaid Other | Source: Ambulatory Visit | Attending: Obstetrics and Gynecology | Admitting: Obstetrics and Gynecology

## 2016-10-15 DIAGNOSIS — Z302 Encounter for sterilization: Secondary | ICD-10-CM

## 2016-10-15 DIAGNOSIS — O99820 Streptococcus B carrier state complicating pregnancy: Secondary | ICD-10-CM

## 2016-10-15 DIAGNOSIS — F1210 Cannabis abuse, uncomplicated: Secondary | ICD-10-CM | POA: Diagnosis present

## 2016-10-15 DIAGNOSIS — Z3A32 32 weeks gestation of pregnancy: Secondary | ICD-10-CM

## 2016-10-15 DIAGNOSIS — O42913 Preterm premature rupture of membranes, unspecified as to length of time between rupture and onset of labor, third trimester: Secondary | ICD-10-CM | POA: Diagnosis present

## 2016-10-15 DIAGNOSIS — O99824 Streptococcus B carrier state complicating childbirth: Secondary | ICD-10-CM | POA: Diagnosis present

## 2016-10-15 DIAGNOSIS — O4290 Premature rupture of membranes, unspecified as to length of time between rupture and onset of labor, unspecified weeks of gestation: Secondary | ICD-10-CM

## 2016-10-15 DIAGNOSIS — O99334 Smoking (tobacco) complicating childbirth: Secondary | ICD-10-CM | POA: Diagnosis present

## 2016-10-15 DIAGNOSIS — O99324 Drug use complicating childbirth: Secondary | ICD-10-CM | POA: Diagnosis present

## 2016-10-15 DIAGNOSIS — F1410 Cocaine abuse, uncomplicated: Secondary | ICD-10-CM | POA: Diagnosis present

## 2016-10-15 DIAGNOSIS — O9902 Anemia complicating childbirth: Secondary | ICD-10-CM | POA: Diagnosis present

## 2016-10-15 DIAGNOSIS — O42919 Preterm premature rupture of membranes, unspecified as to length of time between rupture and onset of labor, unspecified trimester: Secondary | ICD-10-CM | POA: Diagnosis not present

## 2016-10-15 DIAGNOSIS — D649 Anemia, unspecified: Secondary | ICD-10-CM | POA: Diagnosis present

## 2016-10-15 DIAGNOSIS — F17210 Nicotine dependence, cigarettes, uncomplicated: Secondary | ICD-10-CM | POA: Diagnosis present

## 2016-10-15 DIAGNOSIS — O42113 Preterm premature rupture of membranes, onset of labor more than 24 hours following rupture, third trimester: Secondary | ICD-10-CM | POA: Diagnosis not present

## 2016-10-15 LAB — RAPID URINE DRUG SCREEN, HOSP PERFORMED
Amphetamines: NOT DETECTED
Barbiturates: NOT DETECTED
Benzodiazepines: NOT DETECTED
Cocaine: POSITIVE — AB
Opiates: NOT DETECTED
Tetrahydrocannabinol: POSITIVE — AB

## 2016-10-15 LAB — URINALYSIS, ROUTINE W REFLEX MICROSCOPIC
Bilirubin Urine: NEGATIVE
Glucose, UA: NEGATIVE mg/dL
Hgb urine dipstick: NEGATIVE
Ketones, ur: 5 mg/dL — AB
Nitrite: NEGATIVE
Protein, ur: NEGATIVE mg/dL
Specific Gravity, Urine: 1.017 (ref 1.005–1.030)
pH: 6 (ref 5.0–8.0)

## 2016-10-15 LAB — POCT FERN TEST

## 2016-10-15 LAB — CBC
HEMATOCRIT: 29.2 % — AB (ref 36.0–46.0)
Hemoglobin: 9.9 g/dL — ABNORMAL LOW (ref 12.0–15.0)
MCH: 32.6 pg (ref 26.0–34.0)
MCHC: 33.9 g/dL (ref 30.0–36.0)
MCV: 96.1 fL (ref 78.0–100.0)
Platelets: 194 10*3/uL (ref 150–400)
RBC: 3.04 MIL/uL — AB (ref 3.87–5.11)
RDW: 14.3 % (ref 11.5–15.5)
WBC: 6.7 10*3/uL (ref 4.0–10.5)

## 2016-10-15 LAB — TYPE AND SCREEN
ABO/RH(D): O POS
ANTIBODY SCREEN: NEGATIVE

## 2016-10-15 MED ORDER — DOCUSATE SODIUM 100 MG PO CAPS
100.0000 mg | ORAL_CAPSULE | Freq: Every day | ORAL | Status: DC
  Administered 2016-10-15 – 2016-10-21 (×7): 100 mg via ORAL
  Filled 2016-10-15 (×8): qty 1

## 2016-10-15 MED ORDER — LACTATED RINGERS IV SOLN
INTRAVENOUS | Status: DC
  Administered 2016-10-15 – 2016-10-17 (×7): via INTRAVENOUS

## 2016-10-15 MED ORDER — ACETAMINOPHEN 325 MG PO TABS
650.0000 mg | ORAL_TABLET | ORAL | Status: DC | PRN
  Administered 2016-10-15 – 2016-10-21 (×7): 650 mg via ORAL
  Filled 2016-10-15 (×7): qty 2

## 2016-10-15 MED ORDER — BETAMETHASONE SOD PHOS & ACET 6 (3-3) MG/ML IJ SUSP
12.0000 mg | INTRAMUSCULAR | Status: AC
  Administered 2016-10-15 – 2016-10-16 (×2): 12 mg via INTRAMUSCULAR
  Filled 2016-10-15 (×2): qty 2

## 2016-10-15 MED ORDER — AZITHROMYCIN 250 MG PO TABS
1000.0000 mg | ORAL_TABLET | Freq: Once | ORAL | Status: AC
  Administered 2016-10-15: 1000 mg via ORAL
  Filled 2016-10-15: qty 4

## 2016-10-15 MED ORDER — AMOXICILLIN 500 MG PO CAPS
500.0000 mg | ORAL_CAPSULE | Freq: Three times a day (TID) | ORAL | Status: DC
  Administered 2016-10-17 – 2016-10-21 (×14): 500 mg via ORAL
  Filled 2016-10-15 (×15): qty 1

## 2016-10-15 MED ORDER — CALCIUM CARBONATE ANTACID 500 MG PO CHEW: 2.0000 | CHEWABLE_TABLET | ORAL | Status: DC | PRN

## 2016-10-15 MED ORDER — SODIUM CHLORIDE 0.9 % IV SOLN
2.0000 g | Freq: Four times a day (QID) | INTRAVENOUS | Status: AC
  Administered 2016-10-15 – 2016-10-17 (×8): 2 g via INTRAVENOUS
  Filled 2016-10-15 (×8): qty 2000

## 2016-10-15 MED ORDER — BETAMETHASONE SOD PHOS & ACET 6 (3-3) MG/ML IJ SUSP: 12.0000 mg | INTRAMUSCULAR | Status: DC

## 2016-10-15 MED ORDER — ZOLPIDEM TARTRATE 5 MG PO TABS: 5.0000 mg | ORAL_TABLET | Freq: Every evening | ORAL | Status: DC | PRN

## 2016-10-15 MED ORDER — PRENATAL MULTIVITAMIN CH
1.0000 | ORAL_TABLET | Freq: Every day | ORAL | Status: DC
  Administered 2016-10-15 – 2016-10-21 (×7): 1 via ORAL
  Filled 2016-10-15 (×8): qty 1

## 2016-10-15 NOTE — MAU Note (Signed)
Patient presents with rupture of membranes about 3 hours ago clear fluid,history of PTL, denies pain at this time.

## 2016-10-15 NOTE — Plan of Care (Signed)
Problem: Education: Goal: Knowledge of disease or condition will improve Outcome: Progressing Discussed and educated patient of care regarding PROM.  Patient verbalizes understanding however requests to speak with a Doctor.  Dr. Alysia PennaErvin paged.

## 2016-10-15 NOTE — Progress Notes (Signed)
Patient confused and irritated regarding reason for admission.  Requesting to speak to a Doctor at this time.  Dr. Alysia PennaErvin paged to speak with patient.

## 2016-10-15 NOTE — Progress Notes (Signed)
Patient angry regarding admission and plan of care.  RN discussed and educated multiple times plan of care regarding: fetal monitoring, antibiotic therapy and inpatient admission.  Patient states she can not stay here until the delivery of the baby due to child care issues.  States she will leave AMA.  Patent requesting to speak with the doctor.  Dr. Alysia PennaErvin paged earlier and will speak with patient when available.  Patient also states she was not told why she was being admitted and what the plan for her stay would be.  During the admission to [facility]OB, the patient was drowsy with glazed eyes.

## 2016-10-15 NOTE — Progress Notes (Addendum)
Dr. Alysia PennaErvin at pt bedside explained plan of care, answered all pt questions.  No complaints.  Dr Alysia PennaErvin reviewed fetal monitor strip and orders rec.

## 2016-10-15 NOTE — Progress Notes (Signed)
Patient ID: Alicia Castro, female   DOB: January 22, 1986, 31 y.o.   MRN: [id number] OB Attending Please see H & P. IUP 32 2/7 weeks with PROM, + UDS (cocaine) Indications for admission discussed with pt. Maternal and fetal risks and benefits reviewed as well as treatment plan. Pt verbalized understanding.

## 2016-10-15 NOTE — H&P (Signed)
Alicia Castro is a 31 y.o. female [phone] at 32.2 weeks presenting for rupture of membranes. She states she was cooking and bent down to open the oven and had a large gush of clear fluid at 0400. The water has continued to leak since then. She denies any vaginal bleeding or pain. She reports good fetal movement. Patient sleeping, difficult to arouse. Denies any recent drug use, states she is tired from being up all night.  She received betamethasone on 7/9 and 7/10 after being found to be 1cm dilated in the office.  [facility] and is currently monitored for the following issues for this high-risk pregnancy and has Headache; PRES (posterior reversible encephalopathy syndrome); Supervision of high risk pregnancy, antepartum; History of pre-eclampsia in prior pregnancy, currently pregnant; History of preterm delivery, currently pregnant; History of stillbirth in currently pregnant patient; and Anemia in pregnancy on her problem list  [facility] P[facility]  Dating 19 wk u/s Blood type: O/Positive/-- (05/10 1446)   Genetic Screen Declined all testing. Antibody:Negative (05/10 1446)  Anatomic US  normal f/u at 34 wks due to h/o IUFD Rubella: 5.61 (05/10 1446)  GTT  Third trimester: 2hr normal RPR: Non Reactive (05/10 1446)   Flu vaccine Declined HBsAg: Negative (05/10 1446)   TDaP vaccine 09/2016                         HIV: Non Reactive (05/10 1446)   Baby Food Formula                                        GBS: (For PCN allergy, check sensitivities)  Contraception Desires BTL Pap:2018 co testing negative  Circumcision girl   Pediatrician TAPM   Support Person Mom--shirlene    OB History    Gravida Para Term Preterm AB Living   7 4 2 2 2 3    SAB TAB Ectopic Multiple Live Births     2   0 3     Past Medical History:  Diagnosis Date  . Abnormal Pap smear   . Chlamydia   . Eclampsia   . Gonorrhea   . Preterm labor   . Trichomonas    . UTI (lower urinary tract infection)    pt has h/o Acinetobacter in urine culture-requires Contact Isolation with each hodpitalization  . Vaginal Pap smear, abnormal    Past Surgical History:  Procedure Laterality Date  . COLPOSCOPY    . WISDOM TOOTH EXTRACTION     Family History: family history includes Cancer in her maternal grandmother. Social History:  reports that she has been smoking Cigarettes.  She has been smoking about 0.25 packs per day. She has never used smokeless tobacco. She reports that she drinks about 1.8 oz of alcohol per week . She reports that she uses drugs, including Marijuana, about 7 times per week.    Maternal Diabetes: No Genetic Screening: Normal Maternal Ultrasounds/Referrals: Normal Fetal Ultrasounds or other Referrals:  None Maternal Substance Abuse:  Yes:  Type: Smoker, Marijuana, Cocaine denies any recent use Significant Maternal Medications:  None Significant Maternal Lab Results:  None Other Comments:  None  Review of Systems  Constitutional: Negative.  Negative for chills and fever.  Respiratory: Negative.  Negative for shortness of breath.   Cardiovascular: Negative.  Negative for chest pain.  Gastrointestinal: Negative.  Negative for abdominal pain.  Genitourinary: Negative.  Negative for dysuria and frequency.  Neurological: Negative.    Maternal Medical History:  Reason for admission: Rupture of membranes.   Contractions: Onset was 3-5 hours ago.    Fetal activity: Perceived fetal activity is normal.   Last perceived fetal movement was within the past hour.    Prenatal complications: no prenatal complications Prenatal Complications - Diabetes: none.      Blood pressure 104/60, pulse 96, temperature 97.8 F (36.6 C), resp. rate 16, height 5\' 4"  (1.626 m), weight 197 lb (89.4 kg), last menstrual period 02/06/2016, unknown if currently breastfeeding. Maternal Exam:  Uterine Assessment: No contractions noted  Abdomen: Patient  reports no abdominal tenderness. Fetal presentation: vertex  Introitus: Normal vulva. Normal vagina.  Ferning test: positive.  Nitrazine test: not done. Amniotic fluid character: clear.  Cervix: not evaluated.   Fetal Exam Fetal Monitor Review: Mode: ultrasound.   Baseline rate: 140.  Variability: moderate (6-25 bpm).   Pattern: accelerations present and no decelerations.    Fetal State Assessment: Category I - tracings are normal.     Physical Exam  Nursing note and vitals reviewed. Constitutional: She is oriented to person, place, and time. She appears well-developed and well-nourished.  HENT:  Head: Normocephalic and atraumatic.  Eyes: Conjunctivae are normal. No scleral icterus.  Cardiovascular: Normal rate and regular rhythm.   Respiratory: Breath sounds normal. No respiratory distress.  GI: Soft. She exhibits no distension. There is no tenderness. There is no guarding.  Neurological: She is alert and oriented to person, place, and time.  Skin: Skin is warm and dry.  Psychiatric: She has a normal mood and affect. Her behavior is normal. Judgment and thought content normal.    Prenatal labs: ABO, Rh: O/Positive/-- (05/10 1446) Antibody: Negative (05/10 1446) Rubella: 5.61 (05/10 1446) RPR: Non Reactive (07/09 1309)  HBsAg: Negative (05/10 1446)  HIV:   Non Reactive GBS:   pending  Assessment/Plan: IUP at 32.2 weeks. PPROM. Category I fetal tracing. GBS unknown, culture pending. +UDS cocaine   Admit to High Risk OB Expectant Management Vertex presentation confirmed by bedside u/s Latency antibiotics Betamethasone q24 x2 NICU and SW consult  Cleone SlimCaroline Neill SNM 10/15/2016, 8:12 AM  I confirm that I have verified the information documented in the SNM's note and that I have also personally reperformed the physical exam and all medical decision making activities.  Discussed cocaine + result with patient. She did not respond when I asked when she used last.   States she is sleepy because she has other kids and has been up since yesterday morning working. Discussed we will need to watch her BP and Peds will need to evaluate baby for effects  Transferred to room 310 for observation.  Aviva SignsWilliams, Marie L, CNM   Pt seen and examined. Agree with admission and POC

## 2016-10-16 DIAGNOSIS — O42919 Preterm premature rupture of membranes, unspecified as to length of time between rupture and onset of labor, unspecified trimester: Secondary | ICD-10-CM

## 2016-10-16 DIAGNOSIS — O99820 Streptococcus B carrier state complicating pregnancy: Secondary | ICD-10-CM

## 2016-10-16 LAB — CULTURE, BETA STREP (GROUP B ONLY)

## 2016-10-16 NOTE — Progress Notes (Signed)
On Phone with Dr. Macon LargeAnyanwu reviewed fetal monitor strip saw recent variable she said pt can come of fetal monitor.   Pt removed from fetal monitor .

## 2016-10-16 NOTE — Progress Notes (Signed)
Patient ID: Alicia Castro, female   DOB: Dec 25, 1985, 31 y.o.   MRN: [id number] [facility] ANTEPARTUM COMPREHENSIVE PROGRESS NOTE  Alicia Castro is a 31 y.o. [id number] at 6458w3d  who is admitted for PROM.   Fetal presentation is unsure. Length of Stay:  1  Days  Subjective: Pt without complaints this morning. Still having some leaking. Reports good fetal movement. Denies any pain cramps or contractions   Vitals:  Blood pressure (!) 97/47, pulse 96, temperature 98.4 F (36.9 C), temperature source Oral, resp. rate 17, height 5\' 4"  (1.626 m), weight 89.4 kg (197 lb), last menstrual period 02/06/2016, SpO2 99 %, unknown if currently breastfeeding. Physical Examination: Lungs clear  Heart RRR Abd soft + BS gravid non tender  Fetal Monitoring:  120-130's, acels, good variablity, reactive  Labs:  Results for orders placed or performed during the hospital encounter of 10/15/16 (from the past 24 hour(s))  CBC on admission   Collection Time: 10/15/16  8:40 AM  Result Value Ref Range   WBC 6.7 4.0 - 10.5 K/uL   RBC 3.04 (L) 3.87 - 5.11 MIL/uL   Hemoglobin 9.9 (L) 12.0 - 15.0 g/dL   HCT 02.729.2 (L) 25.336.0 - 66.446.0 %   MCV 96.1 78.0 - 100.0 fL   MCH 32.6 26.0 - 34.0 pg   MCHC 33.9 30.0 - 36.0 g/dL   RDW 40.314.3 47.411.5 - 25.915.5 %   Platelets 194 150 - 400 K/uL  Type and screen [facility]   Collection Time: 10/15/16  8:40 AM  Result Value Ref Range   ABO/RH(D) O POS    Antibody Screen NEG    Sample Expiration 10/18/2016     Imaging Studies:    none   Medications:  Scheduled . [START ON 10/17/2016] amoxicillin  500 mg Oral Q8H  . betamethasone acetate-betamethasone sodium phosphate  12 mg Intramuscular Q24 Hr x 2  . docusate sodium  100 mg Oral Daily  . prenatal multivitamin  1 tablet Oral Q1200   I have reviewed the patient's current medications.  ASSESSMENT: IUP 32 3/7 weeks PROM Unwanted fertility, BTL papers signed 7/10 + UDS H/O PEC H/O PTD H/O  stillbirth  PLAN: Stable. No S/Sx of infection or PTL. Continue with antibiotics. BMZ, second dose today. U/S tomorrow. SW consult pending. Continue routine antenatal care.   Hermina StaggersMichael L Author Hatlestad 10/16/2016,8:10 AM

## 2016-10-16 NOTE — Consult Note (Signed)
Neonatology Consult to Antenatal Patient:  I was asked by Dr. Despina HiddenEure to see this patient in order to provide antenatal counseling due to 32 week for PPROM.  [name] was admitted 10/15/16 at 32 3/[redacted] weeks GA. She is currently 32 4/7 weeks with PPROM, no labor or bleeding.  She just received a booster steroid injection and is receiving prophylactic antibiotics.  UDS + THC and cocaine.  Prior h/o 2 children born at 3032 and 2734 weeks EGA.    I spoke with the patient with her nurse and kids. We discussed the worst case of delivery in the next 1-2 days versus hopeful IOL at 34 weeks, including usual DR management, possible respiratory complications and need for support, IV access, feedings (mother desires breast feeding and is not interested in donor breast milk.  I did not have opportunity to clearly explain that her cocaine use is a contraindication-will need to revisit when a private conversation can be had with her.  She did point out her youngest developed NEC on formula and had to be transferred elsewhere for management.  I counseled her on the benefits of dBM compared to formula.)  She did not have any other questions at this time. We would be glad to come back if she has more questions later.  Thank you for asking me to see this patient.  Alicia Kidavid C. Leary RocaEhrmann, MD Neonatologist  The total length of face-to-face or floor/unit time for this encounter was 40 minutes. Counseling and/or coordination of care was 20 minutes of the above.

## 2016-10-17 ENCOUNTER — Inpatient Hospital Stay (HOSPITAL_COMMUNITY): Payer: Medicaid Other

## 2016-10-17 ENCOUNTER — Encounter: Payer: Self-pay | Admitting: Obstetrics & Gynecology

## 2016-10-17 ENCOUNTER — Other Ambulatory Visit: Payer: Self-pay

## 2016-10-17 NOTE — Progress Notes (Signed)
To ultrasound via w/c.

## 2016-10-17 NOTE — Clinical SW OB High Risk (Signed)
Clinical Social Work Antenatal   Clinical Social Worker:  Dimple Nanas, LCSW Date/Time:  10/17/2016, 2:26 PM Gestational Age on Admission:  31 y.o. Admitting Diagnosis:  10/15/16   Expected Delivery Date:  11/28/16  Family/Home Environment  Home Address:  [address] [place] [address]   Household Member/Support Name:  MOB's children lives with MOB. Tahjae' Chamber 09/11/2003 Tayana Cuthertson 03/31/2013 T'Nilah Echeverria 04/16/2015  Relationship:   Patient's Children Other Support:  FOB, Venetia Constable is currently incarcerated.  Patient will receive support from patient's parents and patient's sister.   Psychosocial Data  Information Source:  Patient Interview Resources:  Patient has a OBCM; Curator   Employment:  Unemployed   Medicaid [facility]):  [place]:  Patient completed [employer]   Current Grade:    Homebound Arranged: Yes  Other Resources:  Medicaid, Physicist, medical, [place]:  None reported   Strengths/Weaknesses/Factors to Consider  Concerns Related to Hospitalization:  Patient did not have any concerns and communicated that patient's children will be attended by friends and family members.    Previous Pregnancies/Feelings Towards Pregnancy?  Concerns related to being/becoming a mother?:  No concerns reported; Patient is expecting to have baby prior to [redacted] weeks gestational and anticipates NICU admission if a need arise.   Social Support (FOB? Who is/will be helping with baby/other kids?): FOB is incarcerated and patient will receive support from patient's immediate and extended family members.    Couples Relationship (describe): None reported; Patient denied DV.    Recent Stressful Life Events (life changes in past year?): Recent incarceration of FOB   Prenatal Care/Education/Home Preparations: Patient's reports having a car seat and will be working towards  securing a bassinet for infant.    Domestic Violence (of any type):  No If Yes to Domestic Violence, Describe/Action Plan:    Substance Use During Pregnancy: Yes (MOB has positve UDS for cocaine. ) (If Yes, Complete SBIRT)  Complete PHQ-9 (Depression Screening) on all Antenatal Patients PHQ-9 Score (If Score => 15 complete TREAT):  n/a   Follow-up Recommendations:  CSW will meet with patient again after patient to deliver to complete a maternal clinical assessment. Patient has been informed of required SA screens for newborn.    Patient Advised/Response:   Patient was understanding and communicated a with hx of CPS involvement. Per Patient, last CPS report was 04/2015; and cas has been closed.   Other:   N/A  Clinical Assessment/Plan:   CSW met with patient in room 310.  When CSW arrived, patient was resting in bed.  With patient's permission, CSW asked patient's guest to leave the room in effort to meet in private.  Patient was polite, forthcoming, and receptive to meeting with CSW. CSW's explained CSW's role and encouraged MOB to ask questions.   Patient expressed being prepared to parent and being in the process of gather all necessary items for infant.  At this time patient does not have a bassinet but plans to obtain one prior to infant's delivery. CSW made patient aware to contact CSW if patient is unable to secure a safe sleeping space prior to patient's and infant's d/c.    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  Patient reported patient's last use of marijuana was the day of patient's admission (10/15/16), and patient's last use of cocaine was about one week ago. CSW made patient aware of hospitals'  SA policy and procedure and patient was understanding. Patinet knows that CSW will follow-up with patient after delivery.   Laurey Arrow, MSW, LCSW Clinical Social Work [phone]

## 2016-10-17 NOTE — Progress Notes (Signed)
Patient ID: Alicia Castro, female   DOB: 31-Jan-1986, 31 y.o.   MRN: [id number] ACULTY PRACTICE ANTEPARTUM COMPREHENSIVE PROGRESS NOTE  Alicia Castro is a 31 y.o. [id number] at 8424w4d  who is admitted for PROM.   Fetal presentation is unsure. Length of Stay:  2  Days  Subjective: Pt reports having and occ cramp last night but has noe resolved. Occ LOF. + FM. Voiding without problems. Tolerating diet.    Vitals:  Blood pressure (!) 100/57, pulse 84, temperature 98.7 F (37.1 C), temperature source Oral, resp. rate 18, height 5\' 4"  (1.626 m), weight 89.4 kg (197 lb), last menstrual period 02/06/2016, SpO2 98 %, unknown if currently breastfeeding.   Physical Examination: Lungs clear Heart RRR Abd soft + BS gravid non tender Ext SCD's  Fetal Monitoring:  130-150's, = accels, good variablity, rare variable noted, reactive, no uterine ctx  Labs:  No results found for this or any previous visit (from the past 24 hour(s)).  Imaging Studies:    U/S today   Medications:  Scheduled . amoxicillin  500 mg Oral Q8H  . docusate sodium  100 mg Oral Daily  . prenatal multivitamin  1 tablet Oral Q1200   I have reviewed the patient's current medications.  ASSESSMENT: IUP 32 4/7 weeks PROM H/O PEC H/O Stillbirth Unwanted fertility + UDS  PLAN: Stable. No S/Sx of infection or PTL. S/P BMZ. Latency antibiotics now. SW consult pending. U/S today. Delivery at 34 weeks or for maternal/fetal indications prior.  Continue routine antenatal care.   Hermina StaggersMichael L Lawan Nanez 10/17/2016,10:05 AM

## 2016-10-17 NOTE — Progress Notes (Signed)
Dr. Dolan AmenHarroway-Smith called me to ask me to take patient off EFM. MD stated she was unconcerned that patient was not having accelerations at present gestational age and that she was comfortable with EFM tracing. EFM removed per physician's instructions.

## 2016-10-17 NOTE — Progress Notes (Signed)
Dr Erin FullingHarraway-Smith called to have her review EFM strip.  She gave no new orders and is not concerned with the strip at this time.

## 2016-10-18 ENCOUNTER — Encounter (HOSPITAL_COMMUNITY): Payer: Self-pay

## 2016-10-18 NOTE — Progress Notes (Signed)
Patient ID: Alicia Castro, female   DOB: 1985-06-08, 31 y.o.   MRN: [id number] ACULTY PRACTICE ANTEPARTUM COMPREHENSIVE PROGRESS NOTE  Alicia Castro is a 31 y.o. [id number] at 6959w5d  who is admitted for PROM.   Fetal presentation is cephalic. Length of Stay:  3  Days  Subjective: Pt without complaints this morning. Denies ut ctx, cramps or pressure. + FM Tolerating diet.  .  Vitals:  Blood pressure 106/68, pulse 89, temperature 98.6 F (37 C), temperature source Oral, resp. rate 16, height 5\' 4"  (1.626 m), weight 197 lb (89.4 kg), last menstrual period 02/06/2016, SpO2 98 %, unknown if currently breastfeeding.   Physical Examination: Lungs clear Heart RRR Abd soft + BS gravid non tender Ext non tender  Fetal Monitoring:  130-150's, good long term variablity, occ variable, reactive  Labs:  No results found for this or any previous visit (from the past 24 hour(s)).  Imaging Studies:    U/S   Medications:  Scheduled . amoxicillin  500 mg Oral Q8H  . docusate sodium  100 mg Oral Daily  . prenatal multivitamin  1 tablet Oral Q1200   I have reviewed the patient's current medications.  ASSESSMENT: IUP 32 5/7  PROM H/O PEC H/O Stillbirth Unwanted fertility, papers signed + UDS  PLAN: Stable. No S/Sx of infection. Latency antibiotics. S/P BMZ. SW consult appreciated. Delivery at 34 weeks or for maternal or fetal indications Continue routine antenatal care.   Hermina StaggersMichael L Ervin 10/18/2016,8:16 AM

## 2016-10-19 NOTE — Progress Notes (Signed)
Patient ID: Alicia Castro, female   DOB: Jun 12, 1985, 31 y.o.   MRN: [id number] ACULTY PRACTICE ANTEPARTUM COMPREHENSIVE PROGRESS NOTE  Alicia Castro is a 31 y.o. [id number] at 6071w6d  who is admitted for PROM.   Fetal presentation is cephalic. Length of Stay:  4  Days  Subjective: Pt without complaints this morning. + FM. Denies ut ctx, cramps or pressure. Tolerating diet. Voiding.   Vitals:  Blood pressure (!) 102/54, pulse 87, temperature 98.4 F (36.9 C), temperature source Oral, resp. rate 18, height 5\' 4"  (1.626 m), weight 197 lb (89.4 kg), last menstrual period 02/06/2016, SpO2 99 %, unknown if currently breastfeeding.   Physical Examination: Lungs clear Heart RRR Abd soft + BS gravid non tender Ext non tender  Fetal Monitoring:  130-140's, good long term varability, occ variable, reassuring for gestational age  Labs:  No results found for this or any previous visit (from the past 24 hour(s)).  Imaging Studies:    none   Medications:  Scheduled . amoxicillin  500 mg Oral Q8H  . docusate sodium  100 mg Oral Daily  . prenatal multivitamin  1 tablet Oral Q1200   I have reviewed the patient's current medications.  ASSESSMENT: IUP 32 6/7 weeks PROM H/O PEC H/O Still birth Unwanted fertility, papers signed  PLAN: Stable. No S/Sx of infection or labor. Latency antibiotics. S/P BMZ. Delivery at 34 weeks or for maternal/fetal idications Continue routine antenatal care.   Hermina StaggersMichael L Othal Kubitz 10/19/2016,7:39 AM

## 2016-10-20 NOTE — Progress Notes (Signed)
FACULTY PRACTICE ANTEPARTUM(COMPREHENSIVE) NOTE Time 5;23 am  Alicia Castro is a 31 y.o. [id number] at 3754w0d  who is admitted for rupture of membranes.   Fetal presentation is cephalic. Length of Stay:  5  Days  Subjective: Asked to evaluate pt due to contractions q 6 mins, no bleeding, slight increase in yellow d/c Patient reports the fetal movement as active. Patient reports uterine contraction  activity as irregular, every 5-8 minutes. Patient reports  vaginal bleeding as none. Patient describes fluid per vagina as Other lite yellow.  Vitals:  Blood pressure (!) 95/57, pulse 82, temperature 98.5 F (36.9 C), temperature source Oral, resp. rate 18, height 5\' 4"  (1.626 m), weight 197 lb (89.4 kg), last menstrual period 02/06/2016, SpO2 100 %, unknown if currently breastfeeding. Physical Examination:  General appearance - alert, well appearing, and in no distress Heart - normal rate and regular rhythm Abdomen - soft, nontender, nondistended Fundal Height:  size equals dates Cervical Exam: Evaluated by digital exam., Position: mid position, Dilation: 2.5cm, Thickness: 50% and Consistency: firm and found to be well applied to the vertex/ /-2 and fetal presentation is cephalic. Extremities: extremities normal, atraumatic, no cyanosis or edema and Homans sign is negative, no sign of DVT with DTRs 2+ bilaterally Membranes:intact, ruptured  Fetal Monitoring:  Baseline: 140 bpm, Variability: Fair (1-6 bpm), Accelerations: Non-reactive but appropriate for gestational age and Decelerations: Absent  Labs:  CBC Latest Ref Rng & Units 10/15/2016 09/19/2016 07/21/2016  WBC 4.0 - 10.5 K/uL 6.7 5.8 8.3  Hemoglobin 12.0 - 15.0 g/dL 4.5(W9.9(L) 10.1(L) 11.3  Hematocrit 36.0 - 46.0 % 29.2(L) 31.1(L) 34.2  Platelets 150 - 400 K/uL 194 217 259    Imaging Studies:      Medications:  Scheduled . amoxicillin  500 mg Oral Q8H  . docusate sodium  100 mg Oral Daily  . prenatal multivitamin  1 tablet Oral  Q1200   I have reviewed the patient's current medications.  ASSESSMENT: Patient Active Problem List   Diagnosis Date Noted  . Group B Streptococcus carrier, +RV culture, currently pregnant 10/16/2016  . Preterm premature rupture of membranes (PPROM) with unknown onset of labor 10/15/2016  . Anemia in pregnancy 10/12/2016  . History of pre-eclampsia in prior pregnancy, currently pregnant 07/21/2016  . History of preterm delivery, currently pregnant 07/21/2016  . History of stillbirth in currently pregnant patient 07/21/2016  . Supervision of high risk pregnancy, antepartum 07/14/2016  . History of PRES (posterior reversible encephalopathy syndrome) 04/11/2013  . Headache 04/08/2013    PLAN: Keep on 3rd floor on continuous monitor,  Iv hydration,  Allow to progress to delivery if labors or s&S of amnionitis, bleeding Continuous monitor   Hallie Ishida V 10/20/2016,5:23 AM    Patient ID: Alicia Castro, female   DOB: Mar 02, 1986, 31 y.o.   MRN: [id number]

## 2016-10-21 ENCOUNTER — Encounter (HOSPITAL_COMMUNITY): Payer: Self-pay

## 2016-10-21 DIAGNOSIS — O99324 Drug use complicating childbirth: Secondary | ICD-10-CM

## 2016-10-21 DIAGNOSIS — F1410 Cocaine abuse, uncomplicated: Secondary | ICD-10-CM

## 2016-10-21 DIAGNOSIS — O42113 Preterm premature rupture of membranes, onset of labor more than 24 hours following rupture, third trimester: Secondary | ICD-10-CM

## 2016-10-21 DIAGNOSIS — Z3A32 32 weeks gestation of pregnancy: Secondary | ICD-10-CM

## 2016-10-21 MED ORDER — EPHEDRINE 5 MG/ML INJ
10.0000 mg | INTRAVENOUS | Status: DC | PRN
  Filled 2016-10-21: qty 2

## 2016-10-21 MED ORDER — FENTANYL 2.5 MCG/ML BUPIVACAINE 1/10 % EPIDURAL INFUSION (WH - ANES)
14.0000 mL/h | INTRAMUSCULAR | Status: DC | PRN
Start: 2016-10-21 — End: 2016-10-22

## 2016-10-21 MED ORDER — LIDOCAINE HCL (PF) 1 % IJ SOLN
30.0000 mL | INTRAMUSCULAR | Status: DC | PRN
  Filled 2016-10-21: qty 30

## 2016-10-21 MED ORDER — LACTATED RINGERS IV SOLN: 500.0000 mL | INTRAVENOUS | Status: DC | PRN

## 2016-10-21 MED ORDER — OXYTOCIN BOLUS FROM INFUSION
500.0000 mL | Freq: Once | INTRAVENOUS | Status: AC
  Administered 2016-10-21: 500 mL via INTRAVENOUS

## 2016-10-21 MED ORDER — TERBUTALINE SULFATE 1 MG/ML IJ SOLN
0.2500 mg | Freq: Once | INTRAMUSCULAR | Status: DC
  Filled 2016-10-21: qty 1

## 2016-10-21 MED ORDER — OXYCODONE-ACETAMINOPHEN 5-325 MG PO TABS: 2.0000 | ORAL_TABLET | ORAL | Status: DC | PRN

## 2016-10-21 MED ORDER — ACETAMINOPHEN 325 MG PO TABS: 650.0000 mg | ORAL_TABLET | ORAL | Status: DC | PRN

## 2016-10-21 MED ORDER — LIDOCAINE HCL (PF) 1 % IJ SOLN
INTRAMUSCULAR | Status: AC
  Filled 2016-10-21: qty 30

## 2016-10-21 MED ORDER — PHENYLEPHRINE 40 MCG/ML (10ML) SYRINGE FOR IV PUSH (FOR BLOOD PRESSURE SUPPORT)
80.0000 ug | PREFILLED_SYRINGE | INTRAVENOUS | Status: DC | PRN
  Filled 2016-10-21: qty 5

## 2016-10-21 MED ORDER — SOD CITRATE-CITRIC ACID 500-334 MG/5ML PO SOLN: 30.0000 mL | ORAL | Status: DC | PRN

## 2016-10-21 MED ORDER — OXYTOCIN 40 UNITS IN LACTATED RINGERS INFUSION - SIMPLE MED
INTRAVENOUS | Status: AC
  Filled 2016-10-21: qty 1000

## 2016-10-21 MED ORDER — LACTATED RINGERS IV SOLN
INTRAVENOUS | Status: DC
  Administered 2016-10-21: 22:00:00 via INTRAVENOUS

## 2016-10-21 MED ORDER — ONDANSETRON HCL 4 MG/2ML IJ SOLN: 4.0000 mg | Freq: Four times a day (QID) | INTRAMUSCULAR | Status: DC | PRN

## 2016-10-21 MED ORDER — FENTANYL CITRATE (PF) 100 MCG/2ML IJ SOLN
100.0000 ug | INTRAMUSCULAR | Status: DC | PRN
  Administered 2016-10-21: 100 ug via INTRAVENOUS
  Filled 2016-10-21: qty 2

## 2016-10-21 MED ORDER — OXYCODONE-ACETAMINOPHEN 5-325 MG PO TABS: 1.0000 | ORAL_TABLET | ORAL | Status: DC | PRN

## 2016-10-21 MED ORDER — FENTANYL 2.5 MCG/ML BUPIVACAINE 1/10 % EPIDURAL INFUSION (WH - ANES)
INTRAMUSCULAR | Status: DC
Start: 2016-10-21 — End: 2016-10-21
  Filled 2016-10-21: qty 100

## 2016-10-21 MED ORDER — DIPHENHYDRAMINE HCL 50 MG/ML IJ SOLN: 12.5000 mg | INTRAMUSCULAR | Status: DC | PRN

## 2016-10-21 MED ORDER — LACTATED RINGERS IV SOLN: 500.0000 mL | Freq: Once | INTRAVENOUS | Status: DC

## 2016-10-21 MED ORDER — FENTANYL CITRATE (PF) 100 MCG/2ML IJ SOLN
INTRAMUSCULAR | Status: AC
  Filled 2016-10-21: qty 2

## 2016-10-21 MED ORDER — PHENYLEPHRINE 40 MCG/ML (10ML) SYRINGE FOR IV PUSH (FOR BLOOD PRESSURE SUPPORT)
PREFILLED_SYRINGE | INTRAVENOUS | Status: AC
  Filled 2016-10-21: qty 10

## 2016-10-21 MED ORDER — OXYTOCIN 40 UNITS IN LACTATED RINGERS INFUSION - SIMPLE MED: 2.5000 [IU]/h | INTRAVENOUS | Status: DC

## 2016-10-21 NOTE — Progress Notes (Signed)
Patient ID: Alicia Castro, female   DOB: 10-Dec-1985, 31 y.o.   MRN: [id number] ACULTY PRACTICE ANTEPARTUM COMPREHENSIVE PROGRESS NOTE  Alicia Castro is a 31 y.o. [id number] at 1559w1d  who is admitted for PROM.   Fetal presentation is cephalic. Length of Stay:  6  Days  Subjective: Pt without complaints this morning. + FM. Occ ut ctx. No vaginal bleeding   Vitals:  Blood pressure 102/60, pulse 96, temperature 98.7 F (37.1 C), temperature source Oral, resp. rate 18, height 5\' 4"  (1.626 m), weight 197 lb (89.4 kg), last menstrual period 02/06/2016, SpO2 98 %, unknown if currently breastfeeding.   Physical Examination: Lungs clear Heart RRR Abd soft + BS Gravid non tender  Fetal Monitoring:  120-140's,  + acels, good LTV  Labs:  No results found for this or any previous visit (from the past 24 hour(s)).  Imaging Studies:    none   Medications:  Scheduled . amoxicillin  500 mg Oral Q8H  . docusate sodium  100 mg Oral Daily  . prenatal multivitamin  1 tablet Oral Q1200   I have reviewed the patient's current medications.  ASSESSMENT: IUP 33 weeks PROM GBS + H/O PEC H/O stillbirth Unwanted fertility, papers signed  PLAN: Stable. No S/Sx of infection or labor. S/P BMZ. Latency antibiotics. Delivery at 34 weeks or for fetal.maternal indications. Continue routine antenatal care.   Hermina StaggersMichael L Loan Oguin 10/21/2016,7:23 AM

## 2016-10-21 NOTE — Plan of Care (Signed)
Problem: Physical Regulation: Goal: Complications related to the disease process, condition or treatment will be avoided or minimized Outcome: Progressing Continuing to monitor vaginal discharge.  Patient states fluid is yellow with a mucous consistency.  The Nurse will continue to monitor to minimize risk for complications associated with disease process.

## 2016-10-21 NOTE — Progress Notes (Signed)
Patient left the unit after being educated regarding no current for her to leave the unit, and made aware of risks

## 2016-10-21 NOTE — Progress Notes (Signed)
Initial Nutrition Assessment  DOCUMENTATION CODES:   Obesity unspecified  INTERVENTION:  Regular diet May order double protein portions, snacks TID and from retail  NUTRITION DIAGNOSIS:  Increased nutrient needs related to  (pregnancy and fetal growth rerquirements) as evidenced by  (33 weeks IUP). GOAL:  Patient will meet greater than or equal to 90% of their needs, Weight gain  MONITOR:  Weight trends  REASON FOR ASSESSMENT:  Antenatal   ASSESSMENT:   33 1/7 weeks PPROM. pre-preg weight ( at 20 weeks 193 lbs) marginal weight gain of 4 lbs, but pt reports good appetite and good tol of diet  Diet Order:  Diet regular Room service appropriate? Yes; Fluid consistency: Thin Skin:  Reviewed, no issues Height:   Ht Readings from Last 1 Encounters:  10/15/16 5\' 4"  (1.626 m)   Weight:   Wt Readings from Last 1 Encounters:  10/15/16 197 lb (89.4 kg)    Ideal Body Weight:   120 lbs  BMI:  Body mass index is 33.81 kg/m.  Estimated Nutritional Needs:   Kcal:  2100-2300  Protein:  95-105 g  Fluid:  2.4 L  EDUCATION NEEDS:   No education needs identified at this time  Inez PilgrimKatherine Ladesha Pacini M.Odis LusterEd. R.D. LDN Neonatal Nutrition Support Specialist/RD III Pager [phone]      Phone [phone]

## 2016-10-22 ENCOUNTER — Inpatient Hospital Stay (HOSPITAL_COMMUNITY): Payer: Medicaid Other | Admitting: Anesthesiology

## 2016-10-22 ENCOUNTER — Encounter (HOSPITAL_COMMUNITY)
Admission: AD | Disposition: A | Discharge status: Home / Self Care | Payer: Self-pay | Source: Ambulatory Visit | Attending: Obstetrics and Gynecology

## 2016-10-22 ENCOUNTER — Encounter (HOSPITAL_COMMUNITY): Payer: Self-pay | Admitting: Anesthesiology

## 2016-10-22 DIAGNOSIS — Z302 Encounter for sterilization: Secondary | ICD-10-CM

## 2016-10-22 HISTORY — PX: TUBAL LIGATION: SHX77

## 2016-10-22 SURGERY — LIGATION, FALLOPIAN TUBE, POSTPARTUM
Anesthesia: General

## 2016-10-22 MED ORDER — SIMETHICONE 80 MG PO CHEW: 80.0000 mg | CHEWABLE_TABLET | ORAL | Status: DC | PRN

## 2016-10-22 MED ORDER — SENNOSIDES-DOCUSATE SODIUM 8.6-50 MG PO TABS
2.0000 | ORAL_TABLET | ORAL | Status: DC
  Administered 2016-10-22 – 2016-10-23 (×2): 2 via ORAL
  Filled 2016-10-22 (×2): qty 2

## 2016-10-22 MED ORDER — PROPOFOL 10 MG/ML IV BOLUS
INTRAVENOUS | Status: DC | PRN
  Administered 2016-10-22: 100 mg via INTRAVENOUS
  Administered 2016-10-22: 180 mg via INTRAVENOUS
  Administered 2016-10-22: 20 mg via INTRAVENOUS
  Administered 2016-10-22: 50 mg via INTRAVENOUS

## 2016-10-22 MED ORDER — FENTANYL CITRATE (PF) 100 MCG/2ML IJ SOLN
INTRAMUSCULAR | Status: DC | PRN
  Administered 2016-10-22: 50 ug via INTRAVENOUS
  Administered 2016-10-22: 100 ug via INTRAVENOUS
  Administered 2016-10-22: 50 ug via INTRAVENOUS

## 2016-10-22 MED ORDER — MEPERIDINE HCL 25 MG/ML IJ SOLN: 6.2500 mg | INTRAMUSCULAR | Status: DC | PRN

## 2016-10-22 MED ORDER — FENTANYL CITRATE (PF) 250 MCG/5ML IJ SOLN
INTRAMUSCULAR | Status: AC
  Filled 2016-10-22: qty 5

## 2016-10-22 MED ORDER — ROCURONIUM BROMIDE 100 MG/10ML IV SOLN
INTRAVENOUS | Status: AC
  Filled 2016-10-22: qty 1

## 2016-10-22 MED ORDER — ONDANSETRON HCL 4 MG/2ML IJ SOLN
INTRAMUSCULAR | Status: AC
  Filled 2016-10-22: qty 2

## 2016-10-22 MED ORDER — COCONUT OIL OIL: 1.0000 "application " | TOPICAL_OIL | Status: DC | PRN

## 2016-10-22 MED ORDER — MIDAZOLAM HCL 2 MG/2ML IJ SOLN
INTRAMUSCULAR | Status: AC
  Filled 2016-10-22: qty 2

## 2016-10-22 MED ORDER — METOCLOPRAMIDE HCL 5 MG/ML IJ SOLN: 10.0000 mg | Freq: Once | INTRAMUSCULAR | Status: DC | PRN

## 2016-10-22 MED ORDER — ZOLPIDEM TARTRATE 5 MG PO TABS: 5.0000 mg | ORAL_TABLET | Freq: Every evening | ORAL | Status: DC | PRN

## 2016-10-22 MED ORDER — KETOROLAC TROMETHAMINE 30 MG/ML IJ SOLN
INTRAMUSCULAR | Status: DC | PRN
  Administered 2016-10-22: 30 mg via INTRAVENOUS

## 2016-10-22 MED ORDER — ONDANSETRON HCL 4 MG/2ML IJ SOLN: 4.0000 mg | INTRAMUSCULAR | Status: DC | PRN

## 2016-10-22 MED ORDER — TETANUS-DIPHTH-ACELL PERTUSSIS 5-2.5-18.5 LF-MCG/0.5 IM SUSP: 0.5000 mL | Freq: Once | INTRAMUSCULAR | Status: DC

## 2016-10-22 MED ORDER — ACETAMINOPHEN 325 MG PO TABS
650.0000 mg | ORAL_TABLET | ORAL | Status: DC | PRN
  Administered 2016-10-23: 650 mg via ORAL
  Filled 2016-10-22: qty 2

## 2016-10-22 MED ORDER — DEXAMETHASONE SODIUM PHOSPHATE 10 MG/ML IJ SOLN
INTRAMUSCULAR | Status: DC | PRN
  Administered 2016-10-22: 10 mg via INTRAVENOUS

## 2016-10-22 MED ORDER — BUPIVACAINE HCL (PF) 0.25 % IJ SOLN
INTRAMUSCULAR | Status: DC | PRN
  Administered 2016-10-22: 10 mL

## 2016-10-22 MED ORDER — ONDANSETRON HCL 4 MG/2ML IJ SOLN
INTRAMUSCULAR | Status: DC | PRN
  Administered 2016-10-22: 4 mg via INTRAVENOUS

## 2016-10-22 MED ORDER — LORAZEPAM 2 MG/ML IJ SOLN
1.0000 mg | Freq: Once | INTRAMUSCULAR | Status: AC
  Administered 2016-10-22: 1 mg via INTRAVENOUS
  Filled 2016-10-22: qty 0.5

## 2016-10-22 MED ORDER — DEXAMETHASONE SODIUM PHOSPHATE 4 MG/ML IJ SOLN
INTRAMUSCULAR | Status: AC
  Filled 2016-10-22: qty 1

## 2016-10-22 MED ORDER — DIBUCAINE 1 % RE OINT: 1.0000 "application " | TOPICAL_OINTMENT | RECTAL | Status: DC | PRN

## 2016-10-22 MED ORDER — PROPOFOL 10 MG/ML IV BOLUS
INTRAVENOUS | Status: AC
  Filled 2016-10-22: qty 20

## 2016-10-22 MED ORDER — ONDANSETRON HCL 4 MG PO TABS: 4.0000 mg | ORAL_TABLET | ORAL | Status: DC | PRN

## 2016-10-22 MED ORDER — IBUPROFEN 600 MG PO TABS
600.0000 mg | ORAL_TABLET | Freq: Four times a day (QID) | ORAL | Status: DC
  Administered 2016-10-22 – 2016-10-23 (×7): 600 mg via ORAL
  Filled 2016-10-22 (×7): qty 1

## 2016-10-22 MED ORDER — BENZOCAINE-MENTHOL 20-0.5 % EX AERO: 1.0000 "application " | INHALATION_SPRAY | CUTANEOUS | Status: DC | PRN

## 2016-10-22 MED ORDER — WITCH HAZEL-GLYCERIN EX PADS: 1.0000 "application " | MEDICATED_PAD | CUTANEOUS | Status: DC | PRN

## 2016-10-22 MED ORDER — LIDOCAINE HCL (CARDIAC) 20 MG/ML IV SOLN
INTRAVENOUS | Status: DC | PRN
  Administered 2016-10-22: 60 mg via INTRAVENOUS

## 2016-10-22 MED ORDER — HYDROMORPHONE HCL 1 MG/ML IJ SOLN: 0.2500 mg | INTRAMUSCULAR | Status: DC | PRN

## 2016-10-22 MED ORDER — MIDAZOLAM HCL 2 MG/2ML IJ SOLN
INTRAMUSCULAR | Status: DC | PRN
  Administered 2016-10-22: 2 mg via INTRAVENOUS

## 2016-10-22 MED ORDER — BUPIVACAINE HCL (PF) 0.25 % IJ SOLN
INTRAMUSCULAR | Status: AC
  Filled 2016-10-22: qty 30

## 2016-10-22 MED ORDER — KETOROLAC TROMETHAMINE 30 MG/ML IJ SOLN
INTRAMUSCULAR | Status: AC
  Filled 2016-10-22: qty 1

## 2016-10-22 MED ORDER — PRENATAL MULTIVITAMIN CH
1.0000 | ORAL_TABLET | Freq: Every day | ORAL | Status: DC
  Administered 2016-10-22 – 2016-10-23 (×2): 1 via ORAL
  Filled 2016-10-22 (×2): qty 1

## 2016-10-22 MED ORDER — SUCCINYLCHOLINE CHLORIDE 200 MG/10ML IV SOSY
PREFILLED_SYRINGE | INTRAVENOUS | Status: AC
  Filled 2016-10-22: qty 10

## 2016-10-22 MED ORDER — LACTATED RINGERS IV SOLN
INTRAVENOUS | Status: DC
  Administered 2016-10-22: 09:00:00 via INTRAVENOUS

## 2016-10-22 MED ORDER — DIPHENHYDRAMINE HCL 25 MG PO CAPS: 25.0000 mg | ORAL_CAPSULE | Freq: Four times a day (QID) | ORAL | Status: DC | PRN

## 2016-10-22 MED ORDER — LIDOCAINE HCL (CARDIAC) 20 MG/ML IV SOLN
INTRAVENOUS | Status: AC
  Filled 2016-10-22: qty 5

## 2016-10-22 MED ORDER — ROCURONIUM BROMIDE 100 MG/10ML IV SOLN
INTRAVENOUS | Status: DC | PRN
  Administered 2016-10-22: 10 mg via INTRAVENOUS

## 2016-10-22 SURGICAL SUPPLY — 19 items
CLIP FILSHIE TUBAL LIGA STRL (Clip) ×3 IMPLANT
CLOTH BEACON ORANGE TIMEOUT ST (SAFETY) ×3 IMPLANT
DRSG OPSITE POSTOP 3X4 (GAUZE/BANDAGES/DRESSINGS) ×3 IMPLANT
DURAPREP 26ML APPLICATOR (WOUND CARE) ×3 IMPLANT
GLOVE BIOGEL PI IND STRL 7.0 (GLOVE) ×2 IMPLANT
GLOVE BIOGEL PI INDICATOR 7.0 (GLOVE) ×4
GLOVE ECLIPSE 7.0 STRL STRAW (GLOVE) ×6 IMPLANT
GOWN STRL REUS W/TWL LRG LVL3 (GOWN DISPOSABLE) ×6 IMPLANT
NEEDLE HYPO 22GX1.5 SAFETY (NEEDLE) ×3 IMPLANT
NS IRRIG 1000ML POUR BTL (IV SOLUTION) ×3 IMPLANT
PACK ABDOMINAL MINOR (CUSTOM PROCEDURE TRAY) ×3 IMPLANT
PROTECTOR NERVE ULNAR (MISCELLANEOUS) ×3 IMPLANT
SPONGE LAP 4X18 X RAY DECT (DISPOSABLE) ×3 IMPLANT
SUT VIC AB 0 CT1 27 (SUTURE) ×2
SUT VIC AB 0 CT1 27XBRD ANBCTR (SUTURE) ×1 IMPLANT
SUT VICRYL 4-0 PS2 18IN ABS (SUTURE) ×3 IMPLANT
SYR CONTROL 10ML LL (SYRINGE) ×3 IMPLANT
TOWEL OR 17X24 6PK STRL BLUE (TOWEL DISPOSABLE) ×6 IMPLANT
TRAY FOLEY CATH SILVER 14FR (SET/KITS/TRAYS/PACK) ×3 IMPLANT

## 2016-10-22 NOTE — Anesthesia Preprocedure Evaluation (Signed)
Anesthesia Evaluation  Patient identified by MRN, date of birth, ID band Patient awake    Reviewed: Allergy & Precautions, NPO status , Patient's Chart, lab work & pertinent test results  Airway Mallampati: II  TM Distance: >3 FB Neck ROM: Limited    Dental no notable dental hx. (+) Teeth Intact   Pulmonary Current Smoker,    Pulmonary exam normal breath sounds clear to auscultation       Cardiovascular Normal cardiovascular exam Rhythm:Regular Rate:Normal     Neuro/Psych  Headaches, Hx/o PRES negative psych ROS   GI/Hepatic negative GI ROS, Neg liver ROS,   Endo/Other  negative endocrine ROS  Renal/GU negative Renal ROS  negative genitourinary   Musculoskeletal   Abdominal (+) + obese,   Peds  Hematology  (+) anemia ,   Anesthesia Other Findings   Reproductive/Obstetrics Desires sterilization Hx/o Pre eclampsia, PRES and eclampsia with prior pregnancy                             Anesthesia Physical Anesthesia Plan  ASA: II  Anesthesia Plan: General   Post-op Pain Management:    Induction: Intravenous, Cricoid pressure planned and Rapid sequence  PONV Risk Score and Plan: 4 or greater and Ondansetron, Dexamethasone, Midazolam, Scopolamine patch - Pre-op and Propofol infusion  Airway Management Planned: Oral ETT  Additional Equipment:   Intra-op Plan:   Post-operative Plan: Extubation in OR  Informed Consent: I have reviewed the patients History and Physical, chart, labs and discussed the procedure including the risks, benefits and alternatives for the proposed anesthesia with the patient or authorized representative who has indicated his/her understanding and acceptance.   Dental advisory given  Plan Discussed with: CRNA, Anesthesiologist and Surgeon  Anesthesia Plan Comments:         Anesthesia Quick Evaluation

## 2016-10-22 NOTE — Interval H&P Note (Signed)
History and Physical Interval Note:  10/22/2016 11:20 AM  Bernie Coveyamara L Winfree  has presented today for surgery now s/p SVD, with the diagnosis of desires sterilization  The various methods of treatment have been discussed with the patient and family. After consideration of risks, benefits and other options for treatment, the patient has consented to  Procedure(s): POST PARTUM TUBAL LIGATION (N/A) as a surgical intervention .  The patient's history has been reviewed, patient examined, no change in status, stable for surgery. Patient counseled, r.e. Risks benefits of BTL, including permanency of procedure, risk of failure(1:100), increased risk of ectopic.  Patient verbalized understanding and desires to proceed I have reviewed the patient's chart and labs.  Questions were answered to the patient's satisfaction.     Reva Boresanya S Breena Bevacqua

## 2016-10-22 NOTE — Transfer of Care (Signed)
Immediate Anesthesia Transfer of Care Note  Patient: Bernie Coveyamara L Hockenberry  Procedure(s) Performed: Procedure(s): POST PARTUM TUBAL LIGATION (N/A)  Patient Location: PACU  Anesthesia Type:General  Level of Consciousness: sedated  Airway & Oxygen Therapy: Patient Spontanous Breathing and Patient connected to nasal cannula oxygen  Post-op Assessment: Report given to RN  Post vital signs: Reviewed and stable  Last Vitals:  Vitals:   10/22/16 0616 10/22/16 0743  BP: (!) 95/56 114/63  Pulse: 67 89  Resp:  18  Temp: 37.1 C   SpO2:  100%    Last Pain:  Vitals:   10/22/16 0616  TempSrc: Oral  PainSc:       Patients Stated Pain Goal: 3 (10/20/16 0730)  Complications: No apparent anesthesia complications

## 2016-10-22 NOTE — Op Note (Signed)
Alicia Castro  10/15/2016 - 10/22/2016  PREOPERATIVE DIAGNOSIS:  Multiparity, undesired fertility  POSTOPERATIVE DIAGNOSIS:  Multiparity, undesired fertility  PROCEDURE:  Postpartum Bilateral Tubal Sterilization using Filshie Clips   ANESTHESIA:  Epidural and local analgesia using 0.25% Marcaine  COMPLICATIONS:  None immediate.  ESTIMATED BLOOD LOSS: 5 ml.  INDICATIONS: 31 y.o. [id number]  with undesired fertility,status post vaginal delivery, desires permanent sterilization.  Other reversible forms of contraception were discussed with patient; she declines all other modalities. Risks of procedure discussed with patient including but not limited to: risk of regret, permanence of method, bleeding, infection, injury to surrounding organs and need for additional procedures.  Failure risk of 0.5-1% with increased risk of ectopic gestation if pregnancy occurs was also discussed with patient.     FINDINGS:  Normal uterus, tubes, and ovaries.  PROCEDURE DETAILS: The patient was taken to the operating room where her spinal anesthesia was dosed up to surgical level and found to be adequate.  She was then placed in a supine position and prepped and draped in the usual sterile fashion.  After an adequate timeout was performed, attention was turned to the patient's abdomen where a small transverse skin incision was made under the umbilical fold. The incision was taken down to the layer of fascia using the scalpel, and fascia was incised, and extended bilaterally. The peritoneum was entered in a sharp fashion. The patient was placed in Trendelenburg.  A moist lap pad was used to move omentum and bowel away until the left fallopian tube was identified and grasped with a Babcock clamp, and followed out to the fimbriated end.  A Filshie clip was placed on the left fallopian tube about 2 cm from the cornu.  A similar process was carried out on the right side allowing for bilateral tubal sterilization.  Good  hemostasis was noted overall.  Local analgesia was injected into both Filshie application sites.The instruments were then removed from the patient's abdomen and the fascial incision was repaired with 0 Vicryl, and the skin was closed with a 4-0 Vicryl subcuticular stitch. The patient tolerated the procedure well.  Sponge, lap, and needle counts were correct times two.  The patient was then taken to the recovery room awake, extubated and in stable condition.  Caryl AdaJazma Melea Prezioso, DO OB Fellow Faculty Practice, [facility] 10/22/2016, 10:15 AM

## 2016-10-22 NOTE — Anesthesia Procedure Notes (Signed)
Procedure Name: Intubation Date/Time: 10/22/2016 9:40 AM Performed by: Casimer Lanius A Pre-anesthesia Checklist: Patient identified, Emergency Drugs available, Suction available and Patient being monitored Patient Re-evaluated:Patient Re-evaluated prior to induction Oxygen Delivery Method: Circle system utilized Preoxygenation: Pre-oxygenation with 100% oxygen Induction Type: IV induction and Cricoid Pressure applied Ventilation: Mask ventilation without difficulty Laryngoscope Size: Mac and 3 Grade View: Grade II Tube size: 7.0 mm Number of attempts: 1 Airway Equipment and Method: Stylet Placement Confirmation: ETT inserted through vocal cords under direct vision,  positive ETCO2 and breath sounds checked- equal and bilateral Secured at: 21 cm Tube secured with: Tape Dental Injury: Teeth and Oropharynx as per pre-operative assessment

## 2016-10-22 NOTE — Anesthesia Postprocedure Evaluation (Signed)
Anesthesia Post Note  Patient: Alicia Castro L Leyda  Procedure(s) Performed: Procedure(s) (LRB): POST PARTUM TUBAL LIGATION (N/A)     Patient location during evaluation: PACU Anesthesia Type: General Level of consciousness: awake and alert and oriented Pain management: pain level controlled Vital Signs Assessment: post-procedure vital signs reviewed and stable Respiratory status: spontaneous breathing, nonlabored ventilation and respiratory function stable Cardiovascular status: blood pressure returned to baseline and stable Postop Assessment: no signs of nausea or vomiting Anesthetic complications: no    Last Vitals:  Vitals:   10/22/16 1100 10/22/16 1115  BP: 111/77 112/80  Pulse: (!) 59 61  Resp:    Temp:  36.9 C  SpO2: 95% 95%    Last Pain:  Vitals:   10/22/16 1115  TempSrc:   PainSc: 2    Pain Goal: Patients Stated Pain Goal: 3 (10/20/16 0730)               [name]

## 2016-10-23 LAB — RPR: RPR Ser Ql: NONREACTIVE

## 2016-10-23 MED ORDER — OXYCODONE HCL 5 MG PO TABS
5.0000 mg | ORAL_TABLET | Freq: Once | ORAL | Status: AC
  Administered 2016-10-23: 5 mg via ORAL
  Filled 2016-10-23: qty 1

## 2016-10-23 MED ORDER — MENTHOL 3 MG MT LOZG
1.0000 | LOZENGE | OROMUCOSAL | Status: DC | PRN
  Administered 2016-10-23: 3 mg via ORAL
  Filled 2016-10-23: qty 9

## 2016-10-23 NOTE — Progress Notes (Signed)

## 2016-10-23 NOTE — Discharge Summary (Signed)
OB Discharge Summary     Patient Name: Alicia Castro DOB: 1985-09-06 MRN: [id number]  Date of admission: 10/15/2016 Delivering MD: Joen Laura C   Date of discharge: 10/23/2016  Admitting diagnosis: 32WKS WATER BROKE desires sterilization Intrauterine pregnancy: [redacted]w[redacted]d     Secondary diagnosis:  Principal Problem:   Preterm premature rupture of membranes (PPROM) with unknown onset of labor Active Problems:   Group B Streptococcus carrier, +RV culture, currently pregnant      Discharge diagnosis: Preterm Pregnancy Delivered                                                                                                Post partum procedures:BTL  Augmentation: None  Complications: None  Hospital course:  Onset of Labor With Vaginal Delivery     31 y.o. yo [id number] at [redacted]w[redacted]d was admitted in Active Labor on 10/15/2016. Patient had an uncomplicated labor course as follows:  Membrane Rupture Time/Date: 4:00 AM ,10/15/2016   Intrapartum Procedures: Episiotomy: None [1]                                         Lacerations:  None [1]  Patient had a delivery of a Viable infant. 10/21/2016  Information for the patient's newborn:  Manaal, Mandala [[id number]]  Delivery Method: Vaginal, Spontaneous Delivery (Filed from Delivery Summary)    Pateint had an uncomplicated postpartum course.  She is ambulating, tolerating a regular diet, passing flatus, and urinating well. Patient is discharged home in stable condition on 10/23/16.   Physical exam  Vitals:   10/22/16 2004 10/23/16 0010 10/23/16 0410 10/23/16 0700  BP: (!) 115/58 (!) 103/58 (!) 106/55 106/60  Pulse: 94 70 73 67  Resp: 18 18 16 18   Temp: 98.4 F (36.9 C) 98.2 F (36.8 C) 98.3 F (36.8 C) 98.3 F (36.8 C)  TempSrc: Oral Oral Oral   SpO2: 98%  97% 100%  Weight:      Height:       General: alert, cooperative and no distress Lochia: appropriate Uterine Fundus: firm Incision: Healing well with no  significant drainage, No significant erythema, Dressing is clean, dry, and intact DVT Evaluation: No evidence of DVT seen on physical exam. Labs: Lab Results  Component Value Date   WBC 6.7 10/15/2016   HGB 9.9 (L) 10/15/2016   HCT 29.2 (L) 10/15/2016   MCV 96.1 10/15/2016   PLT 194 10/15/2016   CMP Latest Ref Rng & Units 07/21/2016  Glucose 65 - 99 mg/dL 72  BUN 6 - 20 mg/dL 7  Creatinine 1.30 - 8.65 mg/dL 7.84  Sodium 696 - 295 mmol/L 140  Potassium 3.5 - 5.2 mmol/L 3.6  Chloride 96 - 106 mmol/L 101  CO2 18 - 29 mmol/L 24  Calcium 8.7 - 10.2 mg/dL 9.2  Total Protein 6.0 - 8.5 g/dL 6.4  Total Bilirubin 0.0 - 1.2 mg/dL <2.8  Alkaline Phos 39 - 117 IU/L 38(L)  AST 0 - 40 IU/L 12  ALT 0 -  32 IU/L 7    Discharge instruction: per After Visit Summary and "Baby and Me Booklet".  After visit meds:  Allergies as of 10/23/2016   No Known Allergies     Medication List    STOP taking these medications   aspirin 81 MG chewable tablet   metroNIDAZOLE 500 MG tablet Commonly known as:  FLAGYL     TAKE these medications   ferrous sulfate 325 (65 FE) MG tablet Take 1 tablet (325 mg total) by mouth daily.   PRENATAL COMPLETE 14-0.4 MG Tabs Take 1 tablet by mouth daily.       Diet: routine diet  Activity: Advance as tolerated. Pelvic rest for 6 weeks.   Outpatient follow up:6 weeks Follow up Appt:Future Appointments Date Time Provider Department Center  10/31/2016 9:30 AM [facility] US 1 [facility]   Follow up Visit:No Follow-up on file.  Postpartum contraception: Tubal Ligation  Newborn Data: Live born female  Birth Weight: 4 lb 5.8 oz (1980 g) APGAR: 8, 9  Baby Feeding: Bottle Disposition:NICU   10/23/2016 John Giovanniorey P Meighan Treto, MD

## 2016-10-23 NOTE — Discharge Instructions (Signed)
Laparoscopic Tubal Ligation, Care After Refer to this sheet in the next few weeks. These instructions provide you with information about caring for yourself after your procedure. Your health care provider may also give you more specific instructions. Your treatment has been planned according to current medical practices, but problems sometimes occur. Call your health care provider if you have any problems or questions after your procedure. What can I expect after the procedure? After the procedure, it is common to have:  A sore throat.  Discomfort in your shoulder.  Mild discomfort or cramping in your abdomen.  Gas pains.  Pain or soreness in the area where the surgical cut (incision) was made.  A bloated feeling.  Tiredness.  Nausea.  Vomiting.  Follow these instructions at home: Medicines  Take over-the-counter and prescription medicines only as told by your health care provider.  Do not take aspirin because it can cause bleeding.  Do not drive or operate heavy machinery while taking prescription pain medicine. Activity  Rest for the rest of the day.  Return to your normal activities as told by your health care provider. Ask your health care provider what activities are safe for you. Incision care   Follow instructions from your health care provider about how to take care of your incision. Make sure you: ? Wash your hands with soap and water before you change your bandage (dressing). If soap and water are not available, use hand sanitizer. ? Change your dressing as told by your health care provider. ? Leave stitches (sutures) in place. They may need to stay in place for 2 weeks or longer.  Check your incision area every day for signs of infection. Check for: ? More redness, swelling, or pain. ? More fluid or blood. ? Warmth. ? Pus or a bad smell. Other Instructions  Do not take baths, swim, or use a hot tub until your health care provider approves. You may take  showers.  Keep all follow-up visits as told by your health care provider. This is important.  Have someone help you with your daily household tasks for the first few days. Contact a health care provider if:  You have more redness, swelling, or pain around your incision.  Your incision feels warm to the touch.  You have pus or a bad smell coming from your incision.  The edges of your incision break open after the sutures have been removed.  Your pain does not improve after 2-3 days.  You have a rash.  You repeatedly become dizzy or light-headed.  Your pain medicine is not helping.  You are constipated. Get help right away if:  You have a fever.  You faint.  You have increasing pain in your abdomen.  You have severe pain in one or both of your shoulders.  You have fluid or blood coming from your sutures or from your vagina.  You have shortness of breath or difficulty breathing.  You have chest pain or leg pain.  You have ongoing nausea, vomiting, or diarrhea. This information is not intended to replace advice given to you by your health care provider. Make sure you discuss any questions you have with your health care provider. Document Released: 09/17/2004 Document Revised: 08/03/2015 Document Reviewed: 02/08/2015 Elsevier Interactive Patient Education  2018 [employer] Home Care Instructions for Mom ACTIVITY  Gradually return to your regular activities.  Let yourself rest. Nap while your baby sleeps.  Avoid lifting anything that is heavier than 10 lb (4.5 kg) until  your health care provider says it is okay.  Avoid activities that take a lot of effort and energy (are strenuous) until approved by your health care provider. Walking at a slow-to-moderate pace is usually safe.  If you had a cesarean delivery: ? Do not vacuum, climb stairs, or drive a car for 4-6 weeks. ? Have someone help you at home until you feel like you can do your usual activities  yourself. ? Do exercises as told by your health care provider, if this applies.  VAGINAL BLEEDING You may continue to bleed for 4-6 weeks after delivery. Over time, the amount of blood usually decreases and the color of the blood usually gets lighter. However, the flow of bright red blood may increase if you have been too active. If you need to use more than one pad in an hour because your pad gets soaked, or if you pass a large clot:  Lie down.  Raise your feet.  Place a cold compress on your lower abdomen.  Rest.  Call your health care provider.  If you are breastfeeding, your period should return anytime between 8 weeks after delivery and the time that you stop breastfeeding. If you are not breastfeeding, your period should return 6-8 weeks after delivery. PERINEAL CARE The perineal area, or perineum, is the part of your body between your thighs. After delivery, this area needs special care. Follow these instructions as told by your health care provider.  Take warm tub baths for 15-20 minutes.  Use medicated pads and pain-relieving sprays and creams as told.  Do not use tampons or douches until vaginal bleeding has stopped.  Each time you go to the bathroom: ? Use a peri bottle. ? Change your pad. ? Use towelettes in place of toilet paper until your stitches have healed.  Do Kegel exercises every day. Kegel exercises help to maintain the muscles that support the vagina, bladder, and bowels. You can do these exercises while you are standing, sitting, or lying down. To do Kegel exercises: ? Tighten the muscles of your abdomen and the muscles that surround your birth canal. ? Hold for a few seconds. ? Relax. ? Repeat until you have done this 5 times in a row.  To prevent hemorrhoids from developing or getting worse: ? Drink enough fluid to keep your urine clear or pale yellow. ? Avoid straining when having a bowel movement. ? Take over-the-counter medicines and stool softeners  as told by your health care provider.  BREAST CARE  Wear a tight-fitting bra.  Avoid taking over-the-counter pain medicine for breast discomfort.  Apply ice to the breasts to help with discomfort as needed: ? Put ice in a plastic bag. ? Place a towel between your skin and the bag. ? Leave the ice on for 20 minutes or as told by your health care provider.  NUTRITION  Eat a well-balanced diet.  Do not try to lose weight quickly by cutting back on calories.  Take your prenatal vitamins until your postpartum checkup or until your health care provider tells you to stop.  POSTPARTUM DEPRESSION You may find yourself crying for no apparent reason and unable to cope with all of the changes that come with having a newborn. This mood is called postpartum depression. Postpartum depression happens because your hormone levels change after delivery. If you have postpartum depression, get support from your partner, friends, and family. If the depression does not go away on its own after several weeks, contact your health care  provider. °BREAST SELF-EXAM °Do a breast self-exam each month, at the same time of the month. If you are breastfeeding, check your breasts just after a feeding, when your breasts are less full. If you are breastfeeding and your period has started, check your breasts on day 5, 6, or 7 of your period. °Report any lumps, bumps, or discharge to your health care provider. Know that breasts are normally lumpy if you are breastfeeding. This is temporary, and it is not a health risk. °INTIMACY AND SEXUALITY °Avoid sexual activity for at least 3-4 weeks after delivery or until the brownish-red vaginal flow is completely gone. If you want to avoid pregnancy, use some form of birth control. You can get pregnant after delivery, even if you have not had your period. °SEEK MEDICAL CARE IF: °· You feel unable to cope with the changes that a child brings to your life, and these feelings do not go away  after several weeks. °· You notice a lump, a bump, or discharge on your breast. ° °SEEK IMMEDIATE MEDICAL CARE IF: °· Blood soaks your pad in 1 hour or less. °· You have: °? Severe pain or cramping in your lower abdomen. °? A bad-smelling vaginal discharge. °? A fever that is not controlled by medicine. °? A fever, and an area of your breast is red and sore. °? Pain or redness in your calf. °? Sudden, severe chest pain. °? Shortness of breath. °? Painful or bloody urination. °? Problems with your vision. °· You vomit for 12 hours or longer. °· You develop a severe headache. °· You have serious thoughts about hurting yourself, your child, or anyone else. ° °This information is not intended to replace advice given to you by your health care provider. Make sure you discuss any questions you have with your health care provider. °Document Released: 02/26/2000 Document Revised: 08/06/2015 Document Reviewed: 09/01/2014 °Elsevier Interactive Patient Education © 2017 Elsevier Inc. ° °

## 2016-10-23 NOTE — Clinical Social Work Maternal (Signed)
  CLINICAL SOCIAL WORK MATERNAL/CHILD NOTE  Patient Details  Name: ABIGAYL HOR MRN: [id number] Date of Birth: [birth year]  Date:  10/23/2016  Clinical Social Worker Initiating Note:  Ferdinand Lango Ireene Ballowe, MSW, LCSW-A  Date/ Time Initiated:  10/22/16/1213     Child's Name:  Peter Garter    Legal Guardian:  Mother   Need for Interpreter:  None   Date of Referral:  10/21/16     Reason for Referral:  Current Substance Use/Substance Use During Pregnancy    Referral Source:  RN   Address:  [address] [place], [place] [address]   Phone number:  [phone] ([phone])   Household Members:  Self, Minor Children Dalia Heading Chambers (DOB 09/11/2003), Roosvelt Harps (DOB 03/31/2013) Jana Hakim (DOB 04/16/2015) )   Natural Supports (not living in the home):  Friends, Extended Family, Artist Supports: None   Employment: Unemployed   Type of Work: Unemployed currently    Education:  9 to 11 years   Museum/gallery curator Resources:  Medicaid   Other Resources:  Physicist, medical , [place] Considerations Which May Impact Care:  None reported.  Strengths:  Compliance with medical plan    Risk Factors/Current Problems:  Substance Use    Cognitive State:  Able to Concentrate , Alert , Goal Oriented , Insightful    Mood/Affect:  Calm , Comfortable , Interested    CSW Assessment: CSW met with MOB at bedside to complete assessment for consult regarding drug exposed new born and for baby's NICU admission. Upon this writers arrival, MOB was laying in hospital bed watching TV. This Probation officer explained role and reasoning for visit. MOB verbalized understanding and was willing to continue with assessment. This Probation officer inquired about hx of substance use. 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 This Probation officer inquired about MOB's last use. MOB notes she used earlier this month prior to being admitted to the hospital. This writer explained the  hospitals policy and procedure regarding drug use and mandated reporting for positive UDS and CDS results of baby. This Probation officer explained to MOB that baby's UDS was negative; however, being that her's was positive on 10/15/2016, it is very likely that baby's CDS will be also but the results are still pending. MOB noted "thats a given it will be positive if it goes back three months". This Probation officer confirmed and agreered with MOB and noted a report cannot be made until we have the positive screen result back.  CSW inquired about FOB's involvement. MOB noted FOB is currently incarcerated but he will be involved once released. CSW inquired if the home is prepared for baby's arrival. MOB notes it is not because she was not expecting baby to arrive this early. She notes she is working with the [employer] to get furniture and is hoping they can supply it. CSW discussed baby box program with MOB and encouraged her to complete online training so that she can receive one if needed. MOB noted she will. At this time, no other needs were addressed or requested. CSW will continue to follow pending CDS results.   CSW Plan/Description:  Information/Referral to [employer] , No Further Intervention Required/No Barriers to Discharge, Patient/Family Education , Psychosocial Support and Ongoing Assessment of Needs, Other (Comment) (CSW will continue to follow CDS results and make a report for positive screens )   Oda Cogan, MSW, [facility]  Office: [phone]

## 2016-10-24 ENCOUNTER — Encounter (HOSPITAL_COMMUNITY): Payer: Self-pay

## 2016-10-25 ENCOUNTER — Encounter (HOSPITAL_COMMUNITY): Payer: Self-pay | Admitting: Family Medicine

## 2016-10-31 ENCOUNTER — Ambulatory Visit (HOSPITAL_COMMUNITY): Payer: Self-pay

## 2016-10-31 ENCOUNTER — Encounter (HOSPITAL_COMMUNITY): Payer: Self-pay

## 2016-12-01 ENCOUNTER — Ambulatory Visit: Payer: Self-pay | Admitting: Advanced Practice Midwife

## 2016-12-01 ENCOUNTER — Encounter: Payer: Self-pay | Admitting: General Practice

## 2016-12-19 ENCOUNTER — Ambulatory Visit (INDEPENDENT_AMBULATORY_CARE_PROVIDER_SITE_OTHER): Payer: Medicaid Other | Admitting: Advanced Practice Midwife

## 2016-12-19 ENCOUNTER — Encounter: Payer: Self-pay | Admitting: Advanced Practice Midwife

## 2016-12-19 ENCOUNTER — Ambulatory Visit: Payer: Self-pay | Admitting: Advanced Practice Midwife

## 2016-12-19 NOTE — Progress Notes (Signed)
Subjective:     Alicia Castro is a 31 y.o. female who presents for a postpartum visit. She is 9 weeks postpartum following a spontaneous vaginal delivery. I have fully reviewed the prenatal and intrapartum course. The delivery was at 32 gestational weeks. Outcome: spontaneous vaginal delivery. Anesthesia: none. Postpartum course has been uncomplicated. Baby's course has been was in NICU, but home now. Doing well. Baby is feeding by bottle - Similac Neosure. Bleeding no bleeding. Bowel function is normal. Bladder function is normal. Patient is sexually active. Contraception method is BTL, inpatient. . Postpartum depression screening: negative.  The following portions of the patient's history were reviewed and updated as appropriate: allergies, current medications, past family history, past medical history, past social history, past surgical history and problem list.  Review of Systems Pertinent items are noted in HPI.   Objective:    There were no vitals taken for this visit.  General:  alert, cooperative and appears stated age   Breasts:  inspection negative, no nipple discharge or bleeding, no masses or nodularity palpable  Lungs: clear to auscultation bilaterally  Heart:  regular rate and rhythm, S1, S2 normal, no murmur, click, rub or gallop  Abdomen: soft, non-tender; bowel sounds normal; no masses,  no organomegaly   Vulva:  not evaluated  Vagina: not evaluated  Cervix:  NA  Corpus: not examined  Adnexa:  not evaluated  Rectal Exam: Not performed.        Assessment:     9 weeks postpartum exam. Pap smear not done at today's visit.   Plan:    1. Contraception: tubal ligation 2. Routine care  3. Follow up in: 1 year or as needed.

## 2016-12-19 NOTE — Patient Instructions (Signed)

## 2018-01-25 ENCOUNTER — Encounter (HOSPITAL_COMMUNITY): Payer: Self-pay | Admitting: Emergency Medicine

## 2018-01-25 ENCOUNTER — Emergency Department (HOSPITAL_COMMUNITY)
Admission: EM | Admit: 2018-01-25 | Discharge: 2018-01-25 | Disposition: A | Discharge status: Home / Self Care | Payer: Medicaid Other | Attending: Emergency Medicine | Admitting: Emergency Medicine

## 2018-01-25 DIAGNOSIS — F17210 Nicotine dependence, cigarettes, uncomplicated: Secondary | ICD-10-CM | POA: Diagnosis not present

## 2018-01-25 DIAGNOSIS — Z79899 Other long term (current) drug therapy: Secondary | ICD-10-CM | POA: Insufficient documentation

## 2018-01-25 DIAGNOSIS — H1033 Unspecified acute conjunctivitis, bilateral: Secondary | ICD-10-CM

## 2018-01-25 DIAGNOSIS — H5711 Ocular pain, right eye: Secondary | ICD-10-CM | POA: Diagnosis present

## 2018-01-25 MED ORDER — POLYMYXIN B-TRIMETHOPRIM 10000-0.1 UNIT/ML-% OP SOLN: 1.0000 [drp] | OPHTHALMIC | 0 refills | Status: AC

## 2018-01-25 NOTE — ED Notes (Signed)
Patient verbalizes understanding of discharge instructions. Opportunity for questioning and answers were provided. Armband removed by staff, pt discharged from ED ambulatory.   

## 2018-01-25 NOTE — ED Provider Notes (Signed)
[facility] Provider Note   CSN: [id number] Arrival date & time: 01/25/18  1539     History   Chief Complaint Chief Complaint  Patient presents with  . Eye Pain    HPI Alicia Castro is a 32 y.o. female.  HPI  32 year old female presents with right eye redness and drainage.  Started 2 days ago.  She is now noticing her left eye is red.  The drainage is mostly clear but this morning had crusting that was yellow.  No fevers, cough, or shortness of breath.  She had a cold about 2 weeks ago but otherwise no recent illness.  No one else is sick with similar symptoms.  There is some generalized eye discomfort but no eye pain and no blurry vision.  She does not wear contacts.  Past Medical History:  Diagnosis Date  . Abnormal Pap smear   . Chlamydia   . Eclampsia   . Gonorrhea   . Preterm labor   . Trichomonas   . UTI (lower urinary tract infection)    pt has h/o Acinetobacter in urine culture-requires Contact Isolation with each hodpitalization  . Vaginal Pap smear, abnormal     Patient Active Problem List   Diagnosis Date Noted  . Vaginal delivery 10/24/2016  . Group B Streptococcus carrier, +RV culture, currently pregnant 10/16/2016  . Preterm premature rupture of membranes (PPROM) with unknown onset of labor 10/15/2016  . Anemia in pregnancy 10/12/2016  . History of pre-eclampsia in prior pregnancy, currently pregnant 07/21/2016  . History of preterm delivery, currently pregnant 07/21/2016  . History of stillbirth in currently pregnant patient 07/21/2016  . Supervision of high risk pregnancy, antepartum 07/14/2016  . History of PRES (posterior reversible encephalopathy syndrome) 04/11/2013  . Headache 04/08/2013    Past Surgical History:  Procedure Laterality Date  . COLPOSCOPY    . TUBAL LIGATION N/A 10/22/2016   Procedure: POST PARTUM TUBAL LIGATION;  Surgeon: Reva BoresPratt, Tanya S, MD;  Location: [facility];  Service: Gynecology;   Laterality: N/A;  . WISDOM TOOTH EXTRACTION       OB History    Gravida  7   Para  5   Term  1   Preterm  4   AB  2   Living  4     SAB      TAB  2   Ectopic      Multiple  0   Live Births  4            Home Medications    Prior to Admission medications   Medication Sig Start Date End Date Taking? Authorizing Provider  ferrous sulfate 325 (65 FE) MG tablet Take 1 tablet (325 mg total) by mouth daily. 10/12/16   Pincus LargePhelps, Jazma Y, DO  Prenatal Vit-Fe Fumarate-FA (PRENATAL COMPLETE) 14-0.4 MG TABS Take 1 tablet by mouth daily. 06/28/16   Marny LowensteinWenzel, Julie N, PA-C  trimethoprim-polymyxin b (POLYTRIM) ophthalmic solution Place 1 drop into both eyes every 3 (three) hours while awake for 7 days. 01/25/18 02/01/18  Pricilla LovelessGoldston, Zarin Hagmann, MD    Family History Family History  Problem Relation Age of Onset  . Cancer Maternal Grandmother     Social History Social History   Tobacco Use  . Smoking status: Current Every Day Smoker    Packs/day: 0.25    Types: Cigarettes  . Smokeless tobacco: Never Used  Substance Use Topics  . Alcohol use: Yes    Alcohol/week: 3.0 standard drinks  Types: 3 Cans of beer per week    Comment: smoked yesterday, daily  . Drug use: Yes    Frequency: 7.0 times per week    Types: Marijuana    Comment: ocassionally      Allergies   Patient has no known allergies.   Review of Systems Review of Systems  Constitutional: Negative for fever.  Eyes: Positive for discharge, redness and itching. Negative for pain and visual disturbance.  Respiratory: Negative for cough and shortness of breath.      Physical Exam Updated Vital Signs BP 111/72 (BP Location: Right Arm)   Pulse 89   Temp 97.8 F (36.6 C) (Oral)   SpO2 100%   Physical Exam  Constitutional: She appears well-developed and well-nourished.  HENT:  Head: Normocephalic and atraumatic.  Right Ear: External ear normal.  Left Ear: External ear normal.  Nose: Nose normal.  Eyes:  Pupils are equal, round, and reactive to light. EOM are normal. Right conjunctiva is injected. Left conjunctiva is injected.  Right worse than left conjunctivitis. Mild right eye clear drainage  Cardiovascular: Normal rate, regular rhythm and normal heart sounds.  Pulmonary/Chest: Effort normal and breath sounds normal. She has no wheezes.  Neurological: She is alert.  Skin: Skin is warm and dry.  Psychiatric: Her mood appears not anxious.  Nursing note and vitals reviewed.    ED Treatments / Results  Labs (all labs ordered are listed, but only abnormal results are displayed) Labs Reviewed - No data to display  EKG None  Radiology No results found.  Procedures Procedures (including critical care time)  Medications Ordered in ED Medications - No data to display   Initial Impression / Assessment and Plan / ED Course  I have reviewed the triage vital signs and the nursing notes.  Pertinent labs & imaging results that were available during my care of the patient were reviewed by me and considered in my medical decision making (see chart for details).     Symptoms are probably viral in nature but will cover with Polytrim for possible bacterial disease.  We discussed how this illness is contagious.  Otherwise, no trauma to the eye to suggest concern for injury.  We also discussed lubricating eyedrops and anti-itch eyedrops.  Follow-up with ophthalmology if not improving.  Final Clinical Impressions(s) / ED Diagnoses   Final diagnoses:  Acute conjunctivitis of both eyes, unspecified acute conjunctivitis type    ED Discharge Orders         Ordered    trimethoprim-polymyxin b (POLYTRIM) ophthalmic solution  Every  3 hours while awake     01/25/18 1602           Pricilla Loveless, MD 01/25/18 1614

## 2018-01-25 NOTE — ED Triage Notes (Signed)
Pt here for possible conjunctivitis. Pt states her eye is red, crusty, and has some drainage. Pt states she has not been around anyone with pink eye.

## 2018-01-25 NOTE — Discharge Instructions (Addendum)
If you develop eye pain, fever, blurry vision, or any other new/concerning symptoms then return to the ER for evaluation.

## 2018-09-26 ENCOUNTER — Encounter (HOSPITAL_COMMUNITY): Payer: Self-pay | Admitting: Emergency Medicine

## 2018-09-26 ENCOUNTER — Ambulatory Visit (HOSPITAL_COMMUNITY)
Admission: EM | Admit: 2018-09-26 | Discharge: 2018-09-26 | Disposition: A | Discharge status: Home / Self Care | Payer: Medicaid Other | Attending: Family Medicine | Admitting: Family Medicine

## 2018-09-26 ENCOUNTER — Other Ambulatory Visit: Payer: Self-pay

## 2018-09-26 DIAGNOSIS — N3090 Cystitis, unspecified without hematuria: Secondary | ICD-10-CM

## 2018-09-26 LAB — POCT URINALYSIS DIP (DEVICE)
Bilirubin Urine: NEGATIVE
Glucose, UA: NEGATIVE mg/dL
Ketones, ur: NEGATIVE mg/dL
Nitrite: NEGATIVE
Protein, ur: 30 mg/dL — AB
Specific Gravity, Urine: 1.02 (ref 1.005–1.030)
Urobilinogen, UA: 0.2 mg/dL (ref 0.0–1.0)
pH: 6 (ref 5.0–8.0)

## 2018-09-26 MED ORDER — CEPHALEXIN 500 MG PO CAPS: 500.0000 mg | ORAL_CAPSULE | Freq: Two times a day (BID) | ORAL | 0 refills | Status: DC

## 2018-09-26 NOTE — ED Triage Notes (Signed)
PT reports dysuria for 1 week. Now has foul smelling urine and back pain.

## 2018-09-28 LAB — URINE CULTURE: Culture: 90000 — AB

## 2018-09-29 NOTE — ED Provider Notes (Signed)
[place]    ASSESSMENT & PLAN:  1. Cystitis     Meds ordered this encounter  Medications  . cephALEXin (KEFLEX) 500 MG capsule    Sig: Take 1 capsule (500 mg total) by mouth 2 (two) times daily.    Dispense:  10 capsule    Refill:  0   No signs of pyelonephritis. Discussed. Urine culture sent. Will notify patient when results available. Will follow up with her PCP or here if not showing improvement over the next 48 hours, sooner if needed.  Outlined signs and symptoms indicating need for more acute intervention. Patient verbalized understanding. After Visit Summary given.  SUBJECTIVE:  Alicia Castro is a 33 y.o. female who complains of urinary frequency, urgency and dysuria for the past 1 week. Without associated flank pain, fever, chills, genitourinary discharge or gross bleeding. Hematuria: not present. Normal PO intake. Without specific abdominal pain. No self treatment reported. Ambulatory without difficulty.  LMP: No LMP recorded. (Menstrual status: Other).  ROS: As in HPI.  OBJECTIVE:  Vitals:   09/26/18 1927  BP: 107/73  Pulse: 81  Resp: 16  Temp: 98.4 F (36.9 C)  TempSrc: Oral  SpO2: 97%   General appearance: alert; no distress HENT: oropharynx: moist Lungs: unlabored respirations Abdomen: soft, non-tender; bowel sounds normal; no masses or organomegaly; no guarding or rebound tenderness Back: no CVA tenderness Extremities: no edema; symmetrical with no gross deformities Skin: warm and dry Neurologic: normal gait Psychological: alert and cooperative; normal mood and affect  Labs Reviewed  POCT URINALYSIS DIP (DEVICE) - Abnormal; Notable for the following components:   Hgb urine dipstick SMALL (*)    Protein, ur 30 (*)    Leukocytes,Ua SMALL (*)    All other components within normal limits    No Known Allergies  Past Medical History:  Diagnosis Date  . Abnormal Pap smear   . Chlamydia   . Eclampsia   . Gonorrhea   .  Preterm labor   . Trichomonas   . UTI (lower urinary tract infection)    pt has h/o Acinetobacter in urine culture-requires Contact Isolation with each hodpitalization  . Vaginal Pap smear, abnormal    Social History   Socioeconomic History  . Marital status: Single    Spouse name: Not on file  . Number of children: Not on file  . Years of education: Not on file  . Highest education level: Not on file  Occupational History  . Not on file  Social Needs  . Financial resource strain: Not on file  . Food insecurity    Worry: Not on file    Inability: Not on file  . Transportation needs    Medical: Not on file    Non-medical: Not on file  Tobacco Use  . Smoking status: Current Every Day Smoker    Packs/day: 0.25    Types: Cigarettes  . Smokeless tobacco: Never Used  Substance and Sexual Activity  . Alcohol use: Yes    Alcohol/week: 3.0 standard drinks    Types: 3 Cans of beer per week    Comment: smoked yesterday, daily  . Drug use: Yes    Frequency: 7.0 times per week    Types: Marijuana    Comment: ocassionally   . Sexual activity: Yes    Comment: Sometimes  Lifestyle  . Physical activity    Days per week: Not on file    Minutes per session: Not on file  . Stress: Not on file  Relationships  . Social Musicianconnections    Talks on phone: Not on file    Gets together: Not on file    Attends religious service: Not on file    Active member of club or organization: Not on file    Attends meetings of clubs or organizations: Not on file    Relationship status: Not on file  . Intimate partner violence    Fear of current or ex partner: Not on file    Emotionally abused: Not on file    Physically abused: Not on file    Forced sexual activity: Not on file  Other Topics Concern  . Not on file  Social History Narrative  . Not on file   Family History  Problem Relation Age of Onset  . Cancer Maternal Dulce SellarGrandmother        Lemonte Al, MD 10/01/18 [id number]

## 2018-10-02 ENCOUNTER — Telehealth (HOSPITAL_COMMUNITY): Payer: Self-pay | Admitting: Emergency Medicine

## 2018-10-02 NOTE — Telephone Encounter (Signed)
Urine culture was positive for e coli and was given keflex at urgent care visit. Pt contacted and made aware, educated on completing antibiotic and to follow up if symptoms are persistent. Verbalized understanding.    

## 2019-01-12 ENCOUNTER — Encounter (HOSPITAL_COMMUNITY): Payer: Self-pay

## 2019-01-12 ENCOUNTER — Ambulatory Visit (HOSPITAL_COMMUNITY)
Admission: EM | Admit: 2019-01-12 | Discharge: 2019-01-12 | Disposition: A | Discharge status: Home / Self Care | Payer: Medicaid Other | Attending: Internal Medicine | Admitting: Internal Medicine

## 2019-01-12 DIAGNOSIS — Z20828 Contact with and (suspected) exposure to other viral communicable diseases: Secondary | ICD-10-CM | POA: Diagnosis present

## 2019-01-12 DIAGNOSIS — Z20822 Contact with and (suspected) exposure to covid-19: Secondary | ICD-10-CM
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
## 2019-01-12 NOTE — ED Provider Notes (Signed)
[facility]    CSN: [id number] Arrival date & time: 01/12/19  1047      History   Chief Complaint Chief Complaint  Patient presents with  . exposed covid 19    HPI Alicia Castro is a 33 y.o. female with no past medical history comes to urgent care after she had close exposure to a person infected with COVID-19 virus.  She has some runny nose as started a couple days ago.  She denies any cough or sputum production.  No shortness of breath or generalized body aches or headaches.  No nausea, vomiting, loss of taste or smell.Marland Kitchen   HPI  Past Medical History:  Diagnosis Date  . Abnormal Pap smear   . Chlamydia   . Eclampsia   . Gonorrhea   . Preterm labor   . Trichomonas   . UTI (lower urinary tract infection)    pt has h/o Acinetobacter in urine culture-requires Contact Isolation with each hodpitalization  . Vaginal Pap smear, abnormal     Patient Active Problem List   Diagnosis Date Noted  . Vaginal delivery 10/24/2016  . Group B Streptococcus carrier, +RV culture, currently pregnant 10/16/2016  . Preterm premature rupture of membranes (PPROM) with unknown onset of labor 10/15/2016  . Anemia in pregnancy 10/12/2016  . History of pre-eclampsia in prior pregnancy, currently pregnant 07/21/2016  . History of preterm delivery, currently pregnant 07/21/2016  . History of stillbirth in currently pregnant patient 07/21/2016  . Supervision of high risk pregnancy, antepartum 07/14/2016  . History of PRES (posterior reversible encephalopathy syndrome) 04/11/2013  . Headache 04/08/2013    Past Surgical History:  Procedure Laterality Date  . COLPOSCOPY    . TUBAL LIGATION N/A 10/22/2016   Procedure: POST PARTUM TUBAL LIGATION;  Surgeon: Reva Bores, MD;  Location: [facility];  Service: Gynecology;  Laterality: N/A;  . WISDOM TOOTH EXTRACTION      OB History    Gravida  7   Para  5   Term  1   Preterm  4   AB  2   Living  4     SAB      TAB  2   Ectopic      Multiple  0   Live Births  4            Home Medications    Prior to Admission medications   Medication Sig Start Date End Date Taking? Authorizing Provider  cephALEXin (KEFLEX) 500 MG capsule Take 1 capsule (500 mg total) by mouth 2 (two) times daily. 09/26/18   Mardella Layman, MD  ferrous sulfate 325 (65 FE) MG tablet Take 1 tablet (325 mg total) by mouth daily. 10/12/16   Pincus Large, DO  Prenatal Vit-Fe Fumarate-FA (PRENATAL COMPLETE) 14-0.4 MG TABS Take 1 tablet by mouth daily. 06/28/16   Marny Lowenstein, PA-C    Family History Family History  Problem Relation Age of Onset  . Cancer Maternal Grandmother     Social History Social History   Tobacco Use  . Smoking status: Current Every Day Smoker    Packs/day: 0.25    Types: Cigarettes  . Smokeless tobacco: Never Used  Substance Use Topics  . Alcohol use: Yes    Alcohol/week: 3.0 standard drinks    Types: 3 Cans of beer per week    Comment: smoked yesterday, daily  . Drug use: Yes    Frequency: 7.0 times per week    Types: Marijuana  Comment: ocassionally      Allergies   Patient has no known allergies.   Review of Systems Review of Systems  Constitutional: Negative.   HENT: Positive for congestion and rhinorrhea. Negative for postnasal drip, sinus pressure, sinus pain and sneezing.   Eyes: Negative.   Respiratory: Negative.  Negative for chest tightness, shortness of breath and wheezing.   Cardiovascular: Negative.   Gastrointestinal: Negative.   Genitourinary: Negative.   Neurological: Negative.      Physical Exam Triage Vital Signs ED Triage Vitals  Enc Vitals Group     BP      Pulse      Resp      Temp      Temp src      SpO2      Weight      Height      Head Circumference      Peak Flow      Pain Score      Pain Loc      Pain Edu?      Excl. in [place]?    No data found.  Updated Vital Signs There were no vitals taken for this visit.  Visual Acuity Right Eye  Distance:   Left Eye Distance:   Bilateral Distance:    Right Eye Near:   Left Eye Near:    Bilateral Near:     Physical Exam Vitals signs and nursing note reviewed.  Constitutional:      General: She is not in acute distress.    Appearance: She is not ill-appearing.  HENT:     Right Ear: Tympanic membrane normal.     Left Ear: Tympanic membrane normal.  Cardiovascular:     Pulses: Normal pulses.     Heart sounds: Normal heart sounds. No murmur. No gallop.   Pulmonary:     Effort: Pulmonary effort is normal.     Breath sounds: Normal breath sounds. No wheezing or rhonchi.  Abdominal:     General: Bowel sounds are normal.  Skin:    Capillary Refill: Capillary refill takes less than 2 seconds.  Neurological:     General: No focal deficit present.     Mental Status: She is alert and oriented to person, place, and time.      UC Treatments / Results  Labs (all labs ordered are listed, but only abnormal results are displayed) Labs Reviewed - No data to display  EKG   Radiology No results found.  Procedures Procedures (including critical care time)  Medications Ordered in UC Medications - No data to display  Initial Impression / Assessment and Plan / UC Course  I have reviewed the triage vital signs and the nursing notes.  Pertinent labs & imaging results that were available during my care of the patient were reviewed by me and considered in my medical decision making (see chart for details).     1.  Close exposure to COVID-19 patient: Patient is advised to monitor her symptoms Patient is advised to self isolate until COVID-19 test results are available If patient develops any further/worsening symptoms she will need to report to urgent care to be reevaluated. Final Clinical Impressions(s) / UC Diagnoses   Final diagnoses:  None   Discharge Instructions   None    ED Prescriptions    None     PDMP not reviewed this encounter.   Chase Picket, MD  01/12/19 [phone]

## 2019-01-13 LAB — NOVEL CORONAVIRUS, NAA (HOSP ORDER, SEND-OUT TO REF LAB; TAT 18-24 HRS): SARS-CoV-2, NAA: NOT DETECTED

## 2019-01-16 ENCOUNTER — Encounter (HOSPITAL_COMMUNITY): Payer: Self-pay

## 2019-01-16 ENCOUNTER — Emergency Department (HOSPITAL_COMMUNITY)
Admission: EM | Admit: 2019-01-16 | Discharge: 2019-01-16 | Disposition: A | Discharge status: Home / Self Care | Payer: Medicaid Other | Attending: Emergency Medicine | Admitting: Emergency Medicine

## 2019-01-16 ENCOUNTER — Other Ambulatory Visit: Payer: Self-pay

## 2019-01-16 DIAGNOSIS — T161XXA Foreign body in right ear, initial encounter: Secondary | ICD-10-CM | POA: Diagnosis present

## 2019-01-16 DIAGNOSIS — Y9389 Activity, other specified: Secondary | ICD-10-CM | POA: Diagnosis not present

## 2019-01-16 DIAGNOSIS — F1210 Cannabis abuse, uncomplicated: Secondary | ICD-10-CM | POA: Insufficient documentation

## 2019-01-16 DIAGNOSIS — Y929 Unspecified place or not applicable: Secondary | ICD-10-CM | POA: Insufficient documentation

## 2019-01-16 DIAGNOSIS — Z79899 Other long term (current) drug therapy: Secondary | ICD-10-CM | POA: Insufficient documentation

## 2019-01-16 DIAGNOSIS — Y998 Other external cause status: Secondary | ICD-10-CM | POA: Insufficient documentation

## 2019-01-16 DIAGNOSIS — F17210 Nicotine dependence, cigarettes, uncomplicated: Secondary | ICD-10-CM | POA: Diagnosis not present

## 2019-01-16 DIAGNOSIS — X58XXXA Exposure to other specified factors, initial encounter: Secondary | ICD-10-CM | POA: Diagnosis not present

## 2019-01-16 NOTE — Discharge Instructions (Addendum)
Return here as needed.  Follow-up with your primary doctor. °

## 2019-01-16 NOTE — ED Notes (Signed)
An After Visit Summary was printed and given to the patient. Discharge instructions given and no further questions at this time.  

## 2019-01-16 NOTE — ED Triage Notes (Addendum)
Pt states she was using a Qtip to clean her R ear at approximately 0030 this morning and the tip of the Qtip came off in her ear. Pt denies pain but does state she has discomfort in R ear.

## 2019-01-16 NOTE — ED Provider Notes (Signed)
[facility] DEPT Provider Note   CSN: [id number] Arrival date & time: 01/16/19  1206     History   Chief Complaint Chief Complaint  Patient presents with  . Foreign Body in [place] is a 33 y.o. female.     HPI Patient presents to the emergency department with something in her right ear canal.  The patient states she feels like it is a piece of the Q-tip that she was using.  Patient states she does have some irritation in the ear canal.  Patient has no other complaints. Past Medical History:  Diagnosis Date  . Abnormal Pap smear   . Chlamydia   . Eclampsia   . Gonorrhea   . Preterm labor   . Trichomonas   . UTI (lower urinary tract infection)    pt has h/o Acinetobacter in urine culture-requires Contact Isolation with each hodpitalization  . Vaginal Pap smear, abnormal     Patient Active Problem List   Diagnosis Date Noted  . Vaginal delivery 10/24/2016  . Group B Streptococcus carrier, +RV culture, currently pregnant 10/16/2016  . Preterm premature rupture of membranes (PPROM) with unknown onset of labor 10/15/2016  . Anemia in pregnancy 10/12/2016  . History of pre-eclampsia in prior pregnancy, currently pregnant 07/21/2016  . History of preterm delivery, currently pregnant 07/21/2016  . History of stillbirth in currently pregnant patient 07/21/2016  . Supervision of high risk pregnancy, antepartum 07/14/2016  . History of PRES (posterior reversible encephalopathy syndrome) 04/11/2013  . Headache 04/08/2013    Past Surgical History:  Procedure Laterality Date  . COLPOSCOPY    . TUBAL LIGATION N/A 10/22/2016   Procedure: POST PARTUM TUBAL LIGATION;  Surgeon: Donnamae Jude, MD;  Location: [facility];  Service: Gynecology;  Laterality: N/A;  . WISDOM TOOTH EXTRACTION       OB History    Gravida  7   Para  5   Term  1   Preterm  4   AB  2   Living  4     SAB      TAB  2   Ectopic      Multiple   0   Live Births  4            Home Medications    Prior to Admission medications   Medication Sig Start Date End Date Taking? Authorizing Provider  cephALEXin (KEFLEX) 500 MG capsule Take 1 capsule (500 mg total) by mouth 2 (two) times daily. 09/26/18   Vanessa Kick, MD  ferrous sulfate 325 (65 FE) MG tablet Take 1 tablet (325 mg total) by mouth daily. 10/12/16   Katheren Shams, DO  Prenatal Vit-Fe Fumarate-FA (PRENATAL COMPLETE) 14-0.4 MG TABS Take 1 tablet by mouth daily. 06/28/16   Luvenia Redden, PA-C    Family History Family History  Problem Relation Age of Onset  . Cancer Maternal Grandmother     Social History Social History   Tobacco Use  . Smoking status: Current Every Day Smoker    Packs/day: 0.25    Types: Cigarettes  . Smokeless tobacco: Never Used  Substance Use Topics  . Alcohol use: Yes    Alcohol/week: 3.0 standard drinks    Types: 3 Cans of beer per week    Comment: smoked yesterday, daily  . Drug use: Yes    Frequency: 7.0 times per week    Types: Marijuana    Comment: ocassionally  Allergies   Patient has no known allergies.   Review of Systems Review of Systems All other systems negative except as documented in the HPI. All pertinent positives and negatives as reviewed in the HPI.  Physical Exam Updated Vital Signs BP 111/78   Pulse 98   Temp 98.7 F (37.1 C) (Oral)   Resp 18   SpO2 99%   Physical Exam Vitals signs and nursing note reviewed.  Constitutional:      General: She is not in acute distress.    Appearance: She is well-developed.  HENT:     Head: Normocephalic and atraumatic.     Right Ear: A foreign body is present.     Ears:   Eyes:     Pupils: Pupils are equal, round, and reactive to light.  Pulmonary:     Effort: Pulmonary effort is normal.  Skin:    General: Skin is warm and dry.  Neurological:     Mental Status: She is alert and oriented to person, place, and time.      ED Treatments / Results   Labs (all labs ordered are listed, but only abnormal results are displayed) Labs Reviewed - No data to display  EKG None  Radiology No results found.  Procedures .Foreign Body Removal  Date/Time: 01/16/2019 2:49 PM Performed by: Charlestine Night, PA-C Authorized by: Charlestine Night, PA-C  Consent: Verbal consent obtained. Risks and benefits: risks, benefits and alternatives were discussed Consent given by: patient Patient understanding: patient does not state understanding of the procedure being performed Body area: ear Location details: right ear  Sedation: Patient sedated: no  Removal mechanism: irrigation Complexity: simple Post-procedure assessment: foreign body removed   (including critical care time)  Medications Ordered in ED Medications - No data to display   Initial Impression / Assessment and Plan / ED Course  I have reviewed the triage vital signs and the nursing notes.  Pertinent labs & imaging results that were available during my care of the patient were reviewed by me and considered in my medical decision making (see chart for details).       Is advised to return here as needed.  I have advised her to avoid using Q-tips deep into her ear. Final Clinical Impressions(s) / ED Diagnoses   Final diagnoses:  None    ED Discharge Orders    None       Charlestine Night, PA-C 01/16/19 1457    Terald Sleeper, MD 01/17/19 [phone]

## 2019-03-15 ENCOUNTER — Other Ambulatory Visit: Payer: Self-pay

## 2019-03-15 ENCOUNTER — Encounter (HOSPITAL_COMMUNITY): Payer: Self-pay | Admitting: Emergency Medicine

## 2019-03-15 DIAGNOSIS — Z5321 Procedure and treatment not carried out due to patient leaving prior to being seen by health care provider: Secondary | ICD-10-CM | POA: Insufficient documentation

## 2019-03-15 DIAGNOSIS — R519 Headache, unspecified: Secondary | ICD-10-CM | POA: Insufficient documentation

## 2019-03-15 NOTE — ED Triage Notes (Signed)
Pt reports headache that started this morning on posterior portion of head and radiates to front on left side. Pt reports minimal relief with BC powder.

## 2019-03-16 ENCOUNTER — Emergency Department (HOSPITAL_COMMUNITY)
Admission: EM | Admit: 2019-03-16 | Discharge: 2019-03-16 | Disposition: A | Discharge status: Home / Self Care | Payer: Medicaid Other | Attending: Emergency Medicine | Admitting: Emergency Medicine

## 2019-11-03 ENCOUNTER — Emergency Department (HOSPITAL_COMMUNITY)
Admission: EM | Admit: 2019-11-03 | Discharge: 2019-11-04 | Disposition: A | Discharge status: Home / Self Care | Payer: Medicaid Other | Attending: Emergency Medicine | Admitting: Emergency Medicine

## 2019-11-03 ENCOUNTER — Other Ambulatory Visit: Payer: Self-pay

## 2019-11-03 ENCOUNTER — Encounter (HOSPITAL_COMMUNITY): Payer: Self-pay | Admitting: Emergency Medicine

## 2019-11-03 ENCOUNTER — Emergency Department (HOSPITAL_COMMUNITY): Payer: Medicaid Other

## 2019-11-03 DIAGNOSIS — Z79899 Other long term (current) drug therapy: Secondary | ICD-10-CM | POA: Insufficient documentation

## 2019-11-03 DIAGNOSIS — F1590 Other stimulant use, unspecified, uncomplicated: Secondary | ICD-10-CM | POA: Diagnosis not present

## 2019-11-03 DIAGNOSIS — F17210 Nicotine dependence, cigarettes, uncomplicated: Secondary | ICD-10-CM | POA: Diagnosis not present

## 2019-11-03 DIAGNOSIS — U071 COVID-19: Secondary | ICD-10-CM | POA: Diagnosis not present

## 2019-11-03 DIAGNOSIS — R05 Cough: Secondary | ICD-10-CM | POA: Diagnosis present

## 2019-11-03 LAB — I-STAT BETA HCG BLOOD, ED (MC, WL, AP ONLY): I-stat hCG, quantitative: <5 m[IU]/mL (ref <5)

## 2019-11-03 NOTE — ED Triage Notes (Signed)
Patient reports loss of smell and taste , productive cough , nasal congestion and fatigue yesterday , unvaccinated against Covid19 , denies fever or chills .
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
## 2019-11-04 ENCOUNTER — Other Ambulatory Visit: Payer: Self-pay | Admitting: Physician Assistant

## 2019-11-04 ENCOUNTER — Emergency Department (HOSPITAL_COMMUNITY): Payer: Medicaid Other

## 2019-11-04 DIAGNOSIS — U071 COVID-19: Secondary | ICD-10-CM

## 2019-11-04 LAB — BASIC METABOLIC PANEL
Anion gap: 10 (ref 5–15)
BUN: 10 mg/dL (ref 6–20)
CO2: 25 mmol/L (ref 22–32)
Calcium: 8.9 mg/dL (ref 8.9–10.3)
Chloride: 104 mmol/L (ref 98–111)
Creatinine, Ser: 0.77 mg/dL (ref 0.44–1.00)
GFR calc Af Amer: >60 mL/min (ref >60)
GFR calc non Af Amer: >60 mL/min (ref >60)
Glucose, Bld: 111 mg/dL — ABNORMAL HIGH (ref 70–99)
Potassium: 3.7 mmol/L (ref 3.5–5.1)
Sodium: 139 mmol/L (ref 135–145)

## 2019-11-04 LAB — CBC WITH DIFFERENTIAL/PLATELET
Abs Immature Granulocytes: 0 10*3/uL (ref 0.00–0.07)
Basophils Absolute: 0 10*3/uL (ref 0.0–0.1)
Basophils Relative: 0 %
Eosinophils Absolute: 0.1 10*3/uL (ref 0.0–0.5)
Eosinophils Relative: 4 %
HCT: 42 % (ref 36.0–46.0)
Hemoglobin: 13.4 g/dL (ref 12.0–15.0)
Lymphocytes Relative: 60 %
Lymphs Abs: 1.6 10*3/uL (ref 0.7–4.0)
MCH: 31.8 pg (ref 26.0–34.0)
MCHC: 31.9 g/dL (ref 30.0–36.0)
MCV: 99.8 fL (ref 80.0–100.0)
Monocytes Absolute: 0.4 10*3/uL (ref 0.1–1.0)
Monocytes Relative: 16 %
Neutro Abs: 0.5 10*3/uL — ABNORMAL LOW (ref 1.7–7.7)
Neutrophils Relative %: 20 %
Platelets: 161 10*3/uL (ref 150–400)
RBC: 4.21 MIL/uL (ref 3.87–5.11)
RDW: 14 % (ref 11.5–15.5)
WBC: 2.7 10*3/uL — ABNORMAL LOW (ref 4.0–10.5)
nRBC: 0 % (ref 0.0–0.2)
nRBC: 0 /100 WBC

## 2019-11-04 LAB — SARS CORONAVIRUS 2 BY RT PCR (HOSPITAL ORDER, PERFORMED IN ~~LOC~~ HOSPITAL LAB): SARS Coronavirus 2: POSITIVE — AB

## 2019-11-04 MED ORDER — ONDANSETRON 4 MG PO TBDP: 4.0000 mg | ORAL_TABLET | Freq: Three times a day (TID) | ORAL | 0 refills | Status: DC | PRN

## 2019-11-04 NOTE — Discharge Instructions (Signed)
COVID-19 isolation recommendations  Patients who have symptoms consistent with COVID-19 should self isolate until: At least 3 days (72 hours) have passed since recovery, defined as resolution of fever without the use of fever reducing medications and improvement in respiratory symptoms (e.g., cough, shortness of breath), and At least 7 days have passed since symptoms first appeared. Retesting is not required and not recommended as patients can continue to test positive for several weeks despite lack of symptoms.  General Viral Syndrome Care Instructions:  Your symptoms are likely consistent with a viral illness. Viruses do not require or respond to antibiotics. Treatment is symptomatic care and it is important to note that these symptoms may last for 7-14 days.   Hand washing: Wash your hands throughout the day, but especially before and after touching the face, using the restroom, sneezing, coughing, or touching surfaces that have been coughed or sneezed upon. Hydration: Symptoms of most illnesses will be intensified and complicated by dehydration. Dehydration can also extend the duration of symptoms. Drink plenty of fluids and get plenty of rest. You should be drinking at least half a liter of water an hour to stay hydrated. Electrolyte drinks (ex. Gatorade, Powerade, Pedialyte) are also encouraged. You should be drinking enough fluids to make your urine light yellow, almost clear. If this is not the case, you are not drinking enough water. Please note that some of the treatments indicated below will not be effective if you are not adequately hydrated. Diet: Please concentrate on hydration, however, you may introduce food slowly.  Start with a clear liquid diet, progressed to a full liquid diet, and then bland solids as you are able. Pain or fever: Ibuprofen, Naproxen, or acetaminophen (generic for Tylenol) for pain or fever.  Antiinflammatory medications: Take 600 mg of ibuprofen every 6 hours or  440 mg (over the counter dose) to 500 mg (prescription dose) of naproxen every 12 hours for the next 3 days. After this time, these medications may be used as needed for pain. Take these medications with food to avoid upset stomach. Choose only one of these medications, do not take them together. Acetaminophen (generic for Tylenol): Should you continue to have additional pain while taking the ibuprofen or naproxen, you may add in acetaminophen as needed. Your daily total maximum amount of acetaminophen from all sources should be limited to 4000mg /day for persons without liver problems, or 2000mg /day for those with liver problems. Nausea/vomiting: Use the ondansetron (generic for Zofran) for nausea or vomiting.  This medication may not prevent all vomiting or nausea, but can help facilitate better hydration. Things that can help with nausea/vomiting also include peppermint/menthol candies, vitamin B12, and ginger. Diarrhea: May use medications such as loperamide (Imodium) or Bismuth subsalicylate (Pepto-Bismol). Cough: Teas, warm liquids, broths, and honey can help with cough. Zyrtec or Claritin: May add these medication daily to control underlying symptoms of congestion, sneezing, and other signs of allergies.  These medications are available over-the-counter. Generics: Cetirizine (generic for Zyrtec) and loratadine (generic for Claritin). Fluticasone: Use fluticasone (generic for Flonase), as directed, for nasal and sinus congestion.  This medication is available over-the-counter. Congestion: Plain guaifenesin (generic for plain Mucinex) may help relieve congestion. Saline sinus rinses and saline nasal sprays may also help relieve congestion. If you do not have high blood pressure, heart problems, or an allergy to such medications, you may also try phenylephrine or Sudafed. Sore throat: Warm liquids or Chloraseptic spray may help soothe a sore throat. Gargle twice a day with a  salt water solution made  from a half teaspoon of salt in a cup of warm water.  Follow up: Follow up with a primary care provider within the next two weeks should symptoms fail to resolve. Return: Return to the ED for significantly worsening symptoms, shortness of breath, persistent vomiting, large amounts of blood in stool, or any other major concerns.  For prescription assistance, may try using prescription discount sites or apps, such as goodrx.com

## 2019-11-04 NOTE — ED Provider Notes (Signed)
[facility] EMERGENCY DEPARTMENT Provider Note   CSN: [id number] Arrival date & time: 11/03/19  2120     History Chief Complaint  Patient presents with  . Lost Taste/Smell; Cough    Alicia Castro is a 34 y.o. female.  HPI      Alicia Castro is a 34 y.o. female, patient with no pertinent past medical history, presenting to the ED with concern for COVID-19 infection. Primary symptom was loss in taste and smell that occurred 2 days ago. Also notes cough, body aches, and nausea. She has not been vaccinated against COVID-19. Denies fever, shortness of breath, persistent or exertional chest pain, abdominal pain, vomiting, diarrhea, lower extremity swelling/pain, or any other complaints.   Past Medical History:  Diagnosis Date  . Abnormal Pap smear   . Chlamydia   . Eclampsia   . Gonorrhea   . Preterm labor   . Trichomonas   . UTI (lower urinary tract infection)    pt has h/o Acinetobacter in urine culture-requires Contact Isolation with each hodpitalization  . Vaginal Pap smear, abnormal     Patient Active Problem List   Diagnosis Date Noted  . Vaginal delivery 10/24/2016  . Group B Streptococcus carrier, +RV culture, currently pregnant 10/16/2016  . Preterm premature rupture of membranes (PPROM) with unknown onset of labor 10/15/2016  . Anemia in pregnancy 10/12/2016  . History of pre-eclampsia in prior pregnancy, currently pregnant 07/21/2016  . History of preterm delivery, currently pregnant 07/21/2016  . History of stillbirth in currently pregnant patient 07/21/2016  . Supervision of high risk pregnancy, antepartum 07/14/2016  . History of PRES (posterior reversible encephalopathy syndrome) 04/11/2013  . Headache 04/08/2013    Past Surgical History:  Procedure Laterality Date  . COLPOSCOPY    . TUBAL LIGATION N/A 10/22/2016   Procedure: POST PARTUM TUBAL LIGATION;  Surgeon: Reva Bores, MD;  Location: [facility];  Service:  Gynecology;  Laterality: N/A;  . WISDOM TOOTH EXTRACTION       OB History    Gravida  7   Para  5   Term  1   Preterm  4   AB  2   Living  4     SAB      TAB  2   Ectopic      Multiple  0   Live Births  4           Family History  Problem Relation Age of Onset  . Cancer Maternal Grandmother     Social History   Tobacco Use  . Smoking status: Current Every Day Smoker    Packs/day: 0.25    Types: Cigarettes  . Smokeless tobacco: Never Used  Substance Use Topics  . Alcohol use: Yes    Alcohol/week: 3.0 standard drinks    Types: 3 Cans of beer per week    Comment: smoked yesterday, daily  . Drug use: Yes    Frequency: 7.0 times per week    Types: Marijuana    Comment: ocassionally     Home Medications Prior to Admission medications   Medication Sig Start Date End Date Taking? Authorizing Provider  cephALEXin (KEFLEX) 500 MG capsule Take 1 capsule (500 mg total) by mouth 2 (two) times daily. 09/26/18   Mardella Layman, MD  ferrous sulfate 325 (65 FE) MG tablet Take 1 tablet (325 mg total) by mouth daily. 10/12/16   Pincus Large, DO  ondansetron (ZOFRAN ODT) 4 MG disintegrating tablet Take  1 tablet (4 mg total) by mouth every 8 (eight) hours as needed for nausea or vomiting. 11/04/19   Micca Matura C, PA-C  Prenatal Vit-Fe Fumarate-FA (PRENATAL COMPLETE) 14-0.4 MG TABS Take 1 tablet by mouth daily. 06/28/16   Marny Lowenstein, PA-C    Allergies    Patient has no known allergies.  Review of Systems   Review of Systems  Constitutional: Positive for fatigue. Negative for chills and fever.  HENT:       Loss in taste and smell  Respiratory: Positive for cough. Negative for shortness of breath.   Cardiovascular: Negative for chest pain and leg swelling.  Gastrointestinal: Positive for nausea. Negative for abdominal pain, diarrhea and vomiting.  Musculoskeletal: Positive for myalgias.  All other systems reviewed and are negative.   Physical Exam Updated  Vital Signs  Blood pressure 108/82, pulse 75, temperature 98.1 F (36.7 C), resp. rate 16, height 5\' 4"  (1.626 m), weight 90 kg, last menstrual period 11/02/2019, SpO2 100 %, unknown if currently breastfeeding.   Body mass index is 34.06 kg/m. Physical Exam Vitals and nursing note reviewed.  Constitutional:      General: She is not in acute distress.    Appearance: She is well-developed. She is not diaphoretic.  HENT:     Head: Normocephalic and atraumatic.     Mouth/Throat:     Mouth: Mucous membranes are moist.     Pharynx: Oropharynx is clear.  Eyes:     Conjunctiva/sclera: Conjunctivae normal.  Cardiovascular:     Rate and Rhythm: Normal rate and regular rhythm.     Pulses: Normal pulses.          Radial pulses are 2+ on the right side and 2+ on the left side.       Posterior tibial pulses are 2+ on the right side and 2+ on the left side.     Heart sounds: Normal heart sounds.     Comments: Tactile temperature in the extremities appropriate and equal bilaterally. Pulmonary:     Effort: Pulmonary effort is normal. No respiratory distress.     Breath sounds: Normal breath sounds.  Abdominal:     Palpations: Abdomen is soft.     Tenderness: There is no abdominal tenderness. There is no guarding.  Musculoskeletal:     Cervical back: Neck supple.     Right lower leg: No edema.     Left lower leg: No edema.  Lymphadenopathy:     Cervical: No cervical adenopathy.  Skin:    General: Skin is warm and dry.  Neurological:     Mental Status: She is alert.  Psychiatric:        Mood and Affect: Mood and affect normal.        Speech: Speech normal.        Behavior: Behavior normal.     ED Results / Procedures / Treatments   Labs (all labs ordered are listed, but only abnormal results are displayed) Labs Reviewed  SARS CORONAVIRUS 2 BY RT PCR (HOSPITAL ORDER, PERFORMED IN [facility]) - Abnormal; Notable for the following components:      Result Value   SARS  Coronavirus 2 POSITIVE (*)    All other components within normal limits  CBC WITH DIFFERENTIAL/PLATELET - Abnormal; Notable for the following components:   WBC 2.7 (*)    Neutro Abs 0.5 (*)    All other components within normal limits  BASIC METABOLIC PANEL - Abnormal; Notable for the  following components:   Glucose, Bld 111 (*)    All other components within normal limits  PATHOLOGIST SMEAR REVIEW  I-STAT BETA HCG BLOOD, ED ([facility], [facility], [facility] ONLY)    EKG None  Radiology DG Chest Portable 1 View  Result Date: 11/04/2019 CLINICAL DATA:  Cough EXAM: PORTABLE CHEST 1 VIEW COMPARISON:  None. FINDINGS: The heart size and mediastinal contours are within normal limits. Both lungs are clear. The visualized skeletal structures are unremarkable. IMPRESSION: No active disease. Electronically Signed   By: Deatra Robinson M.D.   On: 11/04/2019 02:03    Procedures Procedures (including critical care time)  Medications Ordered in ED Medications - No data to display  ED Course  I have reviewed the triage vital signs and the nursing notes.  Pertinent labs & imaging results that were available during my care of the patient were reviewed by me and considered in my medical decision making (see chart for details).  Clinical Course as of Nov 03 912  Mon Nov 04, 2019  0845 Remeasured her BP myself while in the room and found it to be 113/74.  BP: 94/69 [SJ]  4235 Made contact with [name], PA-C with [facility] infusion team based on BMI. States they will review her chart and make contact with the patient.   [SJ]  0902 I suspect these blood pressures are positional.  When patient is repositioned properly, she does not have blood pressures below 100 systolic.  BP: 92/70 [SJ]    Clinical Course User Index [SJ] Talmadge Ganas, Hillard Danker, PA-C   MDM Rules/Calculators/A&P                          Patient presents primarily complaining of lost in taste and smell. Patient is nontoxic appearing, afebrile, not  tachycardic, not tachypneic, not hypotensive, maintains excellent SPO2 on room air, and is in no apparent distress.   I reviewed and interpreted the patient's labs and radiological studies. Positive for Covid. Leukopenia noted, which is a finding that can be expected with COVID-19 infection.  The patient was given instructions for home care as well as return precautions. Patient voices understanding of these instructions, accepts the plan, and is comfortable with discharge.    LAMETRIA KLUNK was evaluated in Emergency Department on 11/04/2019 for the symptoms described in the history of present illness. She was evaluated in the context of the global COVID-19 pandemic, which necessitated consideration that the patient might be at risk for infection with the SARS-CoV-2 virus that causes COVID-19. Institutional protocols and algorithms that pertain to the evaluation of patients at risk for COVID-19 are in a state of rapid change based on information released by regulatory bodies including the CDC and federal and state organizations. These policies and algorithms were followed during the patient's care in the ED.  Final Clinical Impression(s) / ED Diagnoses Final diagnoses:  COVID-19    Rx / DC Orders ED Discharge Orders         Ordered    ondansetron (ZOFRAN ODT) 4 MG disintegrating tablet  Every 8 hours PRN        11/04/19 0854           Anselm Pancoast, PA-C 11/04/19 0914    Vanetta Mulders, MD 11/04/19 [phone]

## 2019-11-04 NOTE — Progress Notes (Signed)
I connected by phone with Alicia Castro on 11/04/2019 at 10:09 AM to discuss the potential use of a new treatment for mild to moderate COVID-19 viral infection in non-hospitalized patients.  This patient is a 34 y.o. female that meets the FDA criteria for Emergency Use Authorization of COVID monoclonal antibody casirivimab/imdevimab.  Has a (+) direct SARS-CoV-2 viral test result  Has mild or moderate COVID-19   Is NOT hospitalized due to COVID-19  Is within 10 days of symptom onset  Has at least one of the high risk factor(s) for progression to severe COVID-19 and/or hospitalization as defined in EUA.  Specific high risk criteria : BMI > 25   I have spoken and communicated the following to the patient or parent/caregiver regarding COVID monoclonal antibody treatment:  1. FDA has authorized the emergency use for the treatment of mild to moderate COVID-19 in adults and pediatric patients with positive results of direct SARS-CoV-2 viral testing who are 56 years of age and older weighing at least 40 kg, and who are at high risk for progressing to severe COVID-19 and/or hospitalization.  2. The significant known and potential risks and benefits of COVID monoclonal antibody, and the extent to which such potential risks and benefits are unknown.  3. Information on available alternative treatments and the risks and benefits of those alternatives, including clinical trials.  4. Patients treated with COVID monoclonal antibody should continue to self-isolate and use infection control measures (e.g., wear mask, isolate, social distance, avoid sharing personal items, clean and disinfect "high touch" surfaces, and frequent handwashing) according to CDC guidelines.   5. The patient or parent/caregiver has the option to accept or refuse COVID monoclonal antibody treatment.  After reviewing this information with the patient, The patient agreed to proceed with receiving casirivimab\imdevimab  infusion and will be provided a copy of the Fact sheet prior to receiving the infusion.  Sx onset 8/16. Set up for infusion on 8/24 @ 10:30am. Directions given to [facility]. Pt is aware that insurance will be charged an infusion fee.  Pt is not vaccinated.   [name] 11/04/2019 10:09 AM

## 2019-11-05 ENCOUNTER — Inpatient Hospital Stay (HOSPITAL_COMMUNITY): Admission: RE | Admit: 2019-11-05 | Payer: Medicaid Other | Source: Ambulatory Visit

## 2019-11-05 LAB — PATHOLOGIST SMEAR REVIEW

## 2021-01-09 ENCOUNTER — Other Ambulatory Visit: Payer: Self-pay

## 2021-01-09 ENCOUNTER — Encounter (HOSPITAL_COMMUNITY): Payer: Self-pay | Admitting: *Deleted

## 2021-01-09 ENCOUNTER — Ambulatory Visit (HOSPITAL_COMMUNITY)
Admission: EM | Admit: 2021-01-09 | Discharge: 2021-01-09 | Disposition: A | Discharge status: Home / Self Care | Payer: Medicaid Other | Attending: Emergency Medicine | Admitting: Emergency Medicine

## 2021-01-09 DIAGNOSIS — H1032 Unspecified acute conjunctivitis, left eye: Secondary | ICD-10-CM | POA: Diagnosis not present

## 2021-01-09 MED ORDER — ERYTHROMYCIN 5 MG/GM OP OINT: TOPICAL_OINTMENT | OPHTHALMIC | 0 refills | Status: DC

## 2021-01-09 MED ORDER — FLUORESCEIN SODIUM 1 MG OP STRP
ORAL_STRIP | OPHTHALMIC | Status: AC
  Filled 2021-01-09: qty 1

## 2021-01-09 MED ORDER — TETRACAINE HCL 0.5 % OP SOLN
OPHTHALMIC | Status: AC
  Filled 2021-01-09: qty 4

## 2021-01-09 NOTE — Discharge Instructions (Signed)
Apply the erythromycin ointment 1/2 inch to the lower left eyelid 4 times a day for the next 5 to 7 days.  If your symptoms have resolved after 5 days you may stop the medication otherwise continue through 7 days.  Wash pillow cases, wash hands regularly with soap and water, avoid touching your face and eyes, wash door handles, light switches, remotes and other objects you frequently touch Return or follow up with PCP if symptoms persists such as fever, chills, redness, swelling, eye pain, painful eye movements, vision changes.

## 2021-01-09 NOTE — ED Provider Notes (Signed)
[facility]    CSN: [id number] Arrival date & time: 01/09/21  1515      History   Chief Complaint Chief Complaint  Patient presents with   Eye Problem    HPI Alicia Castro is a 35 y.o. female.   Patient here for evaluation of left eye redness and pain that started this morning.  Also reports having a purulent discharge.  Denies getting anything into her eye.  Denies any photophobia or vision changes.  Patient does not wear contacts.  Denies any trauma, injury, or other precipitating event.  Denies any specific alleviating or aggravating factors.  Denies any fevers, chest pain, shortness of breath, N/V/D, numbness, tingling, weakness, abdominal pain, or headaches.    The history is provided by the patient.  Eye Problem Associated symptoms: discharge and redness   Associated symptoms: no photophobia    Past Medical History:  Diagnosis Date   Abnormal Pap smear    Chlamydia    Eclampsia    Gonorrhea    Preterm labor    Trichomonas    UTI (lower urinary tract infection)    pt has h/o Acinetobacter in urine culture-requires Contact Isolation with each hodpitalization   Vaginal Pap smear, abnormal     Patient Active Problem List   Diagnosis Date Noted   Vaginal delivery 10/24/2016   Group B Streptococcus carrier, +RV culture, currently pregnant 10/16/2016   Preterm premature rupture of membranes (PPROM) with unknown onset of labor 10/15/2016   Anemia in pregnancy 10/12/2016   History of pre-eclampsia in prior pregnancy, currently pregnant 07/21/2016   History of preterm delivery, currently pregnant 07/21/2016   History of stillbirth in currently pregnant patient 07/21/2016   Supervision of high risk pregnancy, antepartum 07/14/2016   History of PRES (posterior reversible encephalopathy syndrome) 04/11/2013   Headache 04/08/2013    Past Surgical History:  Procedure Laterality Date   COLPOSCOPY     TUBAL LIGATION N/A 10/22/2016   Procedure: POST  PARTUM TUBAL LIGATION;  Surgeon: Reva Bores, MD;  Location: [facility];  Service: Gynecology;  Laterality: N/A;   WISDOM TOOTH EXTRACTION      OB History     Gravida  7   Para  5   Term  1   Preterm  4   AB  2   Living  4      SAB      IAB  2   Ectopic      Multiple  0   Live Births  4            Home Medications    Prior to Admission medications   Medication Sig Start Date End Date Taking? Authorizing Provider  erythromycin ophthalmic ointment Place a 1/2 inch ribbon of ointment into the lower eyelid 4 times a day for the next 5-7 days. 01/09/21  Yes Ivette Loyal, NP  cephALEXin (KEFLEX) 500 MG capsule Take 1 capsule (500 mg total) by mouth 2 (two) times daily. 09/26/18   Mardella Layman, MD  ferrous sulfate 325 (65 FE) MG tablet Take 1 tablet (325 mg total) by mouth daily. 10/12/16   Pincus Large, DO  ondansetron (ZOFRAN ODT) 4 MG disintegrating tablet Take 1 tablet (4 mg total) by mouth every 8 (eight) hours as needed for nausea or vomiting. 11/04/19   Joy, Shawn C, PA-C  Prenatal Vit-Fe Fumarate-FA (PRENATAL COMPLETE) 14-0.4 MG TABS Take 1 tablet by mouth daily. 06/28/16   Marny Lowenstein, PA-C  Family History Family History  Problem Relation Age of Onset   Cancer Maternal Grandmother     Social History Social History   Tobacco Use   Smoking status: Every Day    Packs/day: 0.25    Types: Cigarettes   Smokeless tobacco: Never  Substance Use Topics   Alcohol use: Yes    Alcohol/week: 3.0 standard drinks    Types: 3 Cans of beer per week    Comment: smoked yesterday, daily   Drug use: Yes    Frequency: 7.0 times per week    Types: Marijuana    Comment: ocassionally      Allergies   Patient has no known allergies.   Review of Systems Review of Systems  Eyes:  Positive for pain, discharge and redness. Negative for photophobia and visual disturbance.  All other systems reviewed and are negative.   Physical Exam Triage Vital Signs ED  Triage Vitals  Enc Vitals Group     BP 01/09/21 1628 106/76     Pulse Rate 01/09/21 1628 93     Resp --      Temp 01/09/21 1628 98.4 F (36.9 C)     Temp src --      SpO2 01/09/21 1628 100 %     Weight --      Height --      Head Circumference --      Peak Flow --      Pain Score 01/09/21 1626 1     Pain Loc --      Pain Edu? --      Excl. in GC? --    No data found.  Updated Vital Signs BP 106/76   Pulse 93   Temp 98.4 F (36.9 C)   LMP 01/02/2021 (Approximate)   SpO2 100%   Visual Acuity Right Eye Distance:   Left Eye Distance:   Bilateral Distance:    Right Eye Near:   Left Eye Near:    Bilateral Near:     Physical Exam Vitals and nursing note reviewed.  Constitutional:      General: She is not in acute distress.    Appearance: Normal appearance. She is not ill-appearing, toxic-appearing or diaphoretic.  HENT:     Head: Normocephalic and atraumatic.  Eyes:     General: Lids are normal. Lids are everted, no foreign bodies appreciated. Vision grossly intact. Gaze aligned appropriately.        Left eye: Discharge present.    Extraocular Movements: Extraocular movements intact.     Conjunctiva/sclera:     Left eye: Left conjunctiva is injected.     Pupils: Pupils are equal, round, and reactive to light.     Left eye: No corneal abrasion or fluorescein uptake.  Cardiovascular:     Rate and Rhythm: Normal rate.     Pulses: Normal pulses.  Pulmonary:     Effort: Pulmonary effort is normal.  Abdominal:     General: Abdomen is flat.  Musculoskeletal:        General: Normal range of motion.     Cervical back: Normal range of motion.  Skin:    General: Skin is warm and dry.  Neurological:     General: No focal deficit present.     Mental Status: She is alert and oriented to person, place, and time.  Psychiatric:        Mood and Affect: Mood normal.     UC Treatments / Results  Labs (all labs ordered  are listed, but only abnormal results are  displayed) Labs Reviewed - No data to display  EKG   Radiology No results found.  Procedures Procedures (including critical care time)  Medications Ordered in UC Medications - No data to display  Initial Impression / Assessment and Plan / UC Course  I have reviewed the triage vital signs and the nursing notes.  Pertinent labs & imaging results that were available during my care of the patient were reviewed by me and considered in my medical decision making (see chart for details).    Assessment negative for red flags or concerns.  Likely acute bacterial conjunctivitis of the left eye.  Will treat with erythromycin 1/2 inch to the lower eyelid for the next 5 to 7 days.  Discussed washing pillowcases, sheets, or anything else touched frequently including hands.  Follow-up as needed Final Clinical Impressions(s) / UC Diagnoses   Final diagnoses:  Acute bacterial conjunctivitis of left eye     Discharge Instructions      Apply the erythromycin ointment 1/2 inch to the lower left eyelid 4 times a day for the next 5 to 7 days.  If your symptoms have resolved after 5 days you may stop the medication otherwise continue through 7 days.  Wash pillow cases, wash hands regularly with soap and water, avoid touching your face and eyes, wash door handles, light switches, remotes and other objects you frequently touch Return or follow up with PCP if symptoms persists such as fever, chills, redness, swelling, eye pain, painful eye movements, vision changes.      ED Prescriptions     Medication Sig Dispense Auth. Provider   erythromycin ophthalmic ointment Place a 1/2 inch ribbon of ointment into the lower eyelid 4 times a day for the next 5-7 days. 3.5 g Ivette Loyal, NP      PDMP not reviewed this encounter.   Ivette Loyal, NP 01/09/21 [phone]

## 2021-01-09 NOTE — ED Triage Notes (Signed)
Pt reports seeing a bubble on lower lid this AM.

## 2021-05-25 ENCOUNTER — Other Ambulatory Visit: Payer: Self-pay

## 2021-05-25 ENCOUNTER — Ambulatory Visit (HOSPITAL_COMMUNITY)
Admission: EM | Admit: 2021-05-25 | Discharge: 2021-05-25 | Disposition: A | Discharge status: Home / Self Care | Payer: Medicaid Other | Attending: Family Medicine | Admitting: Family Medicine

## 2021-05-25 ENCOUNTER — Encounter (HOSPITAL_COMMUNITY): Payer: Self-pay | Admitting: Emergency Medicine

## 2021-05-25 DIAGNOSIS — N390 Urinary tract infection, site not specified: Secondary | ICD-10-CM | POA: Diagnosis not present

## 2021-05-25 DIAGNOSIS — M545 Low back pain, unspecified: Secondary | ICD-10-CM | POA: Diagnosis present

## 2021-05-25 DIAGNOSIS — R350 Frequency of micturition: Secondary | ICD-10-CM | POA: Insufficient documentation

## 2021-05-25 LAB — POCT URINALYSIS DIPSTICK, ED / UC
Bilirubin Urine: NEGATIVE
Glucose, UA: NEGATIVE mg/dL
Hgb urine dipstick: NEGATIVE
Ketones, ur: NEGATIVE mg/dL
Leukocytes,Ua: NEGATIVE
Nitrite: NEGATIVE
Protein, ur: NEGATIVE mg/dL
Specific Gravity, Urine: 1.025 (ref 1.005–1.030)
Urobilinogen, UA: 0.2 mg/dL (ref 0.0–1.0)
pH: 6 (ref 5.0–8.0)

## 2021-05-25 MED ORDER — SULFAMETHOXAZOLE-TRIMETHOPRIM 800-160 MG PO TABS: 1.0000 | ORAL_TABLET | Freq: Two times a day (BID) | ORAL | 0 refills | Status: AC

## 2021-05-25 NOTE — Discharge Instructions (Addendum)
You were seen today for possible urinary tract infection.   Your urine did not show overt infection, but I have treated you given your symptoms.  ?I have sent  out an antibiotic for you today to take twice/day x 7 days.  Please increase your fluid intake.  ?We will culture the urine today.  ?If your symptoms do not improve or if they worsen then please go to the ER for re-evaluation.  ?

## 2021-05-25 NOTE — ED Provider Notes (Signed)
?[facility] ? ? ? ?CSN: [id number] ?Arrival date & time: 05/25/21  1156 ? ? ?  ? ?History   ?Chief Complaint ?Chief Complaint  ?Patient presents with  ? Back Pain  ? Urinary Frequency  ? ? ?HPI ?Alicia Castro is a 36 y.o. female.  ? ?Patient is here for possible uti.  ?Urinary frequency, low back pain x 5 days.  Some urgency, but can't pee.   No n/v.  Slight abd pain.  No fevers.  ?No vaginal d/c.  ?Her lmp was march 6th.  ? ?Past Medical History:  ?Diagnosis Date  ? Abnormal Pap smear   ? Chlamydia   ? Eclampsia   ? Gonorrhea   ? Preterm labor   ? Trichomonas   ? UTI (lower urinary tract infection)   ? pt has h/o Acinetobacter in urine culture-requires Contact Isolation with each hodpitalization  ? Vaginal Pap smear, abnormal   ? ? ?Patient Active Problem List  ? Diagnosis Date Noted  ? Vaginal delivery 10/24/2016  ? Group B Streptococcus carrier, +RV culture, currently pregnant 10/16/2016  ? Preterm premature rupture of membranes (PPROM) with unknown onset of labor 10/15/2016  ? Anemia in pregnancy 10/12/2016  ? History of pre-eclampsia in prior pregnancy, currently pregnant 07/21/2016  ? History of preterm delivery, currently pregnant 07/21/2016  ? History of stillbirth in currently pregnant patient 07/21/2016  ? Supervision of high risk pregnancy, antepartum 07/14/2016  ? History of PRES (posterior reversible encephalopathy syndrome) 04/11/2013  ? Headache 04/08/2013  ? ? ?Past Surgical History:  ?Procedure Laterality Date  ? COLPOSCOPY    ? TUBAL LIGATION N/A 10/22/2016  ? Procedure: POST PARTUM TUBAL LIGATION;  Surgeon: Reva Bores, MD;  Location: [facility];  Service: Gynecology;  Laterality: N/A;  ? WISDOM TOOTH EXTRACTION    ? ? ?OB History   ? ? Gravida  ?7  ? Para  ?5  ? Term  ?1  ? Preterm  ?4  ? AB  ?2  ? Living  ?4  ?  ? ? SAB  ?   ? IAB  ?2  ? Ectopic  ?   ? Multiple  ?0  ? Live Births  ?4  ?   ?  ?  ? ? ? ?Home Medications   ? ?Prior to Admission medications   ?Medication Sig Start  Date End Date Taking? Authorizing Provider  ?ferrous sulfate 325 (65 FE) MG tablet Take 1 tablet (325 mg total) by mouth daily. 10/12/16   Pincus Large, DO  ?Prenatal Vit-Fe Fumarate-FA (PRENATAL COMPLETE) 14-0.4 MG TABS Take 1 tablet by mouth daily. 06/28/16   Marny Lowenstein, PA-C  ? ? ?Family History ?Family History  ?Problem Relation Age of Onset  ? Healthy Mother   ? Healthy Father   ? Cancer Maternal Grandmother   ? ? ?Social History ?Social History  ? ?Tobacco Use  ? Smoking status: Every Day  ?  Packs/day: 0.25  ?  Types: Cigarettes  ? Smokeless tobacco: Never  ?Substance Use Topics  ? Alcohol use: Yes  ?  Alcohol/week: 3.0 standard drinks  ?  Types: 3 Cans of beer per week  ?  Comment: smoked yesterday, daily  ? Drug use: Yes  ?  Frequency: 7.0 times per week  ?  Types: Marijuana  ?  Comment: ocassionally   ? ? ? ?Allergies   ?Patient has no known allergies. ? ? ?Review of Systems ?Review of Systems  ?Constitutional:  Positive for  chills. Negative for fever.  ?HENT: Negative.    ?Respiratory: Negative.    ?Cardiovascular: Negative.   ?Gastrointestinal:  Positive for abdominal pain.  ?Genitourinary:  Positive for frequency.  ?Musculoskeletal:  Positive for back pain.  ? ? ?Physical Exam ?Triage Vital Signs ?ED Triage Vitals  ?Enc Vitals Group  ?   BP 05/25/21 1307 106/72  ?   Pulse Rate 05/25/21 1307 83  ?   Resp 05/25/21 1307 16  ?   Temp 05/25/21 1307 97.8 ?F (36.6 ?C)  ?   Temp Source 05/25/21 1307 Oral  ?   SpO2 05/25/21 1307 98 %  ?   Weight 05/25/21 1305 198 lb 6.6 oz (90 kg)  ?   Height 05/25/21 1305 5\' 4"  (1.626 m)  ?   Head Circumference --   ?   Peak Flow --   ?   Pain Score 05/25/21 1305 8  ?   Pain Loc --   ?   Pain Edu? --   ?   Excl. in GC? --   ? ?No data found. ? ?Updated Vital Signs ?BP 106/72 (BP Location: Left Arm)   Pulse 83   Temp 97.8 ?F (36.6 ?C) (Oral)   Resp 16   Ht 5\' 4"  (1.626 m)   Wt 90 kg   LMP 05/17/2021 (Exact Date)   SpO2 98%   BMI 34.06 kg/m?  ? ?Visual Acuity ?Right  Eye Distance:   ?Left Eye Distance:   ?Bilateral Distance:   ? ?Right Eye Near:   ?Left Eye Near:    ?Bilateral Near:    ? ?Physical Exam ?Constitutional:   ?   Appearance: Normal appearance.  ?Cardiovascular:  ?   Rate and Rhythm: Normal rate and regular rhythm.  ?Pulmonary:  ?   Effort: Pulmonary effort is normal.  ?   Breath sounds: Normal breath sounds.  ?Abdominal:  ?   Palpations: Abdomen is soft.  ?   Tenderness: There is abdominal tenderness in the suprapubic area. There is right CVA tenderness. There is no left CVA tenderness.  ?Neurological:  ?   Mental Status: She is alert.  ? ? ? ?UC Treatments / Results  ?Labs ?(all labs ordered are listed, but only abnormal results are displayed) ?Labs Reviewed  ?POCT URINALYSIS DIPSTICK, ED / UC  ? ? ?EKG ? ? ?Radiology ?No results found. ? ?Procedures ?Procedures (including critical care time) ? ?Medications Ordered in UC ?Medications - No data to display ? ?Initial Impression / Assessment and Plan / UC Course  ?I have reviewed the triage vital signs and the nursing notes. ? ?Pertinent labs & imaging results that were available during my care of the patient were reviewed by me and considered in my medical decision making (see chart for details). ? ? Patient was seen today for dysuria, oliguria, and low back pain.  Her urine did not show overt infection.  Will treat given her symptoms and culture urine.  Will call with results.  ? ?Final Clinical Impressions(s) / UC Diagnoses  ? ?Final diagnoses:  ?Urinary frequency  ?Acute bilateral low back pain without sciatica  ?Lower urinary tract infectious disease  ? ? ? ?Discharge Instructions   ? ?  ?You were seen today for possible urinary tract infection.   Your urine did not show overt infection, but I have treated you given your symptoms.  ?I have sent  out an antibiotic for you today to take twice/day x 7 days.  Please increase your fluid intake.  ?  We will culture the urine today.  ?If your symptoms do not improve or if  they worsen then please go to the ER for re-evaluation.  ? ? ? ?ED Prescriptions   ? ? Medication Sig Dispense Auth. Provider  ? sulfamethoxazole-trimethoprim (BACTRIM DS) 800-160 MG tablet Take 1 tablet by mouth 2 (two) times daily for 7 days. 14 tablet Jannifer Franklin, MD  ? ?  ? ?PDMP not reviewed this encounter. ?  Jannifer Franklin, MD ?05/25/21 1338 ? ?

## 2021-05-25 NOTE — ED Triage Notes (Signed)
Pt reports urinary frequency, oliguria and lower back pain x 5 days.  ?

## 2021-05-28 LAB — URINE CULTURE: Culture: >=100000 — AB

## 2022-03-09 ENCOUNTER — Emergency Department (HOSPITAL_COMMUNITY)
Admission: EM | Admit: 2022-03-09 | Discharge: 2022-03-10 | Discharge status: Against Medical Advice | Payer: Medicaid Other

## 2022-03-09 NOTE — ED Notes (Signed)
Pt called multiple times no answer 

## 2022-08-12 ENCOUNTER — Ambulatory Visit (HOSPITAL_COMMUNITY)
Admission: EM | Admit: 2022-08-12 | Discharge: 2022-08-12 | Disposition: A | Discharge status: Home / Self Care | Payer: Medicaid Other | Attending: Emergency Medicine | Admitting: Emergency Medicine

## 2022-08-12 ENCOUNTER — Encounter (HOSPITAL_COMMUNITY): Payer: Self-pay | Admitting: Emergency Medicine

## 2022-08-12 DIAGNOSIS — R300 Dysuria: Secondary | ICD-10-CM | POA: Diagnosis present

## 2022-08-12 DIAGNOSIS — Z202 Contact with and (suspected) exposure to infections with a predominantly sexual mode of transmission: Secondary | ICD-10-CM | POA: Insufficient documentation

## 2022-08-12 DIAGNOSIS — Z113 Encounter for screening for infections with a predominantly sexual mode of transmission: Secondary | ICD-10-CM | POA: Diagnosis present

## 2022-08-12 LAB — POCT URINALYSIS DIP (MANUAL ENTRY)
Blood, UA: NEGATIVE
Glucose, UA: NEGATIVE mg/dL
Leukocytes, UA: NEGATIVE
Nitrite, UA: NEGATIVE
Protein Ur, POC: NEGATIVE mg/dL
Spec Grav, UA: >=1.03 — AB (ref 1.010–1.025)
Urobilinogen, UA: 1 E.U./dL
pH, UA: 6 (ref 5.0–8.0)

## 2022-08-12 LAB — HIV ANTIBODY (ROUTINE TESTING W REFLEX): HIV Screen 4th Generation wRfx: NONREACTIVE

## 2022-08-12 MED ORDER — METRONIDAZOLE 500 MG PO TABS: 500.0000 mg | ORAL_TABLET | Freq: Two times a day (BID) | ORAL | 0 refills | Status: DC

## 2022-08-12 NOTE — Discharge Instructions (Addendum)
Your urine did not have obvious signs of infection.  Due to your exposure, we are covering you for trichomoniasis infection with Flagyl.  Do not drink alcohol on this medication or you will have intense vomiting.  Please complete all antibiotics as prescribed, you can take them with food to prevent gastrointestinal upset.  Please abstain from intercourse until all treatment is completed.  We will contact you if there are any other abnormalities on your cytology or lab work.  Please abstain from intercourse until all results are received, and if appropriate, until all treatment is received.  Please return to clinic for any new or concerning symptoms.

## 2022-08-12 NOTE — ED Provider Notes (Signed)
[facility]    CSN: [id number] Arrival date & time: 08/12/22  1230      History   Chief Complaint Chief Complaint  Patient presents with   Exposure to STD    Entered by patient    HPI Alicia Castro is a 37 y.o. female.   Patient presents to clinic for concerns over exposure to trichomoniasis.  Reports a recent sexual partner notified her that they tested positive.  Patient also reports urgency with diminished urination and abnormal vaginal discharge for the last few days.  Would like HIV and syphilis screening as well.  The history is provided by the patient and medical records.  Exposure to STD Pertinent negatives include no abdominal pain.    Past Medical History:  Diagnosis Date   Abnormal Pap smear    Chlamydia    Eclampsia    Gonorrhea    Preterm labor    Trichomonas    UTI (lower urinary tract infection)    pt has h/o Acinetobacter in urine culture-requires Contact Isolation with each hodpitalization   Vaginal Pap smear, abnormal     Patient Active Problem List   Diagnosis Date Noted   Vaginal delivery 10/24/2016   Group B Streptococcus carrier, +RV culture, currently pregnant 10/16/2016   Preterm premature rupture of membranes (PPROM) with unknown onset of labor 10/15/2016   Anemia in pregnancy 10/12/2016   History of pre-eclampsia in prior pregnancy, currently pregnant 07/21/2016   History of preterm delivery, currently pregnant 07/21/2016   History of stillbirth in currently pregnant patient 07/21/2016   Supervision of high risk pregnancy, antepartum 07/14/2016   History of PRES (posterior reversible encephalopathy syndrome) 04/11/2013   Headache 04/08/2013    Past Surgical History:  Procedure Laterality Date   COLPOSCOPY     TUBAL LIGATION N/A 10/22/2016   Procedure: POST PARTUM TUBAL LIGATION;  Surgeon: Reva Bores, MD;  Location: [facility];  Service: Gynecology;  Laterality: N/A;   WISDOM TOOTH EXTRACTION      OB History      Gravida  7   Para  5   Term  1   Preterm  4   AB  2   Living  4      SAB      IAB  2   Ectopic      Multiple  0   Live Births  4            Home Medications    Prior to Admission medications   Medication Sig Start Date End Date Taking? Authorizing Provider  metroNIDAZOLE (FLAGYL) 500 MG tablet Take 1 tablet (500 mg total) by mouth 2 (two) times daily. 08/12/22  Yes Priti Consoli, [place] N, FNP    Family History Family History  Problem Relation Age of Onset   Healthy Mother    Healthy Father    Cancer Maternal Grandmother     Social History Social History   Tobacco Use   Smoking status: Every Day    Packs/day: .25    Types: Cigarettes   Smokeless tobacco: Never  Substance Use Topics   Alcohol use: Yes    Alcohol/week: 3.0 standard drinks of alcohol    Types: 3 Cans of beer per week    Comment: smoked yesterday, daily   Drug use: Yes    Frequency: 7.0 times per week    Types: Marijuana    Comment: ocassionally      Allergies   Patient has no known allergies.  Review of Systems Review of Systems  Gastrointestinal:  Negative for abdominal pain.  Genitourinary:  Positive for difficulty urinating, dysuria and vaginal discharge. Negative for flank pain, genital sores and menstrual problem.     Physical Exam Triage Vital Signs ED Triage Vitals [08/12/22 1304]  Enc Vitals Group     BP 104/76     Pulse Rate 78     Resp 16     Temp 99 F (37.2 C)     Temp Source Oral     SpO2 97 %     Weight      Height      Head Circumference      Peak Flow      Pain Score      Pain Loc      Pain Edu?      Excl. in GC?    No data found.  Updated Vital Signs BP 104/76 (BP Location: Right Arm)   Pulse 78   Temp 99 F (37.2 C) (Oral)   Resp 16   LMP 08/04/2022 (Approximate)   SpO2 97%   Visual Acuity Right Eye Distance:   Left Eye Distance:   Bilateral Distance:    Right Eye Near:   Left Eye Near:    Bilateral Near:     Physical  Exam Vitals and nursing note reviewed.  Constitutional:      Appearance: Normal appearance.  HENT:     Head: Normocephalic and atraumatic.     Right Ear: External ear normal.     Left Ear: External ear normal.     Nose: Nose normal.     Mouth/Throat:     Mouth: Mucous membranes are moist.  Eyes:     Conjunctiva/sclera: Conjunctivae normal.  Cardiovascular:     Rate and Rhythm: Normal rate.  Pulmonary:     Effort: Pulmonary effort is normal. No respiratory distress.  Neurological:     General: No focal deficit present.     Mental Status: She is alert and oriented to person, place, and time.  Psychiatric:        Mood and Affect: Mood normal.        Behavior: Behavior normal.      UC Treatments / Results  Labs (all labs ordered are listed, but only abnormal results are displayed) Labs Reviewed  POCT URINALYSIS DIP (MANUAL ENTRY) - Abnormal; Notable for the following components:      Result Value   Bilirubin, UA small (*)    Ketones, POC UA trace (5) (*)    Spec Grav, UA >=1.030 (*)    All other components within normal limits  URINE CULTURE  RPR  HIV ANTIBODY (ROUTINE TESTING W REFLEX)  CERVICOVAGINAL ANCILLARY ONLY    EKG   Radiology No results found.  Procedures Procedures (including critical care time)  Medications Ordered in UC Medications - No data to display  Initial Impression / Assessment and Plan / UC Course  I have reviewed the triage vital signs and the nursing notes.  Pertinent labs & imaging results that were available during my care of the patient were reviewed by me and considered in my medical decision making (see chart for details).  Vitals and triage reviewed, patient is hemodynamically stable.  Recent exposure to trichomoniasis, will prophylactically treat w/ Flagyl.  Cytology and blood work obtained.  Staff will contact patient if any additional treatment is needed. Urinalysis without nitrites or leukocytes, did have trace bilirubin,  ketones and high specific gravity.  Will send for culture.  Plan of care, follow-up care and return precautions given, no questions at this time.     Final Clinical Impressions(s) / UC Diagnoses   Final diagnoses:  Exposure to STD  Dysuria  Screening examination for sexually transmitted disease     Discharge Instructions      Your urine did not have obvious signs of infection.  Due to your exposure, we are covering you for trichomoniasis infection with Flagyl.  Do not drink alcohol on this medication or you will have intense vomiting.  Please complete all antibiotics as prescribed, you can take them with food to prevent gastrointestinal upset.  Please abstain from intercourse until all treatment is completed.  We will contact you if there are any other abnormalities on your cytology or lab work.  Please abstain from intercourse until all results are received, and if appropriate, until all treatment is received.  Please return to clinic for any new or concerning symptoms.      ED Prescriptions     Medication Sig Dispense Auth. Provider   metroNIDAZOLE (FLAGYL) 500 MG tablet Take 1 tablet (500 mg total) by mouth 2 (two) times daily. 14 tablet Mustapha Colson, [place] N, [place]      PDMP not reviewed this encounter.   Shyanne Mcclary, [place] N, [place] 08/12/22 [phone]

## 2022-08-12 NOTE — ED Triage Notes (Signed)
Pt has been exposed to Trich.  Reports urination urge but not urinating and vaginal discharge for a few days.

## 2022-08-13 LAB — RPR: RPR Ser Ql: NONREACTIVE

## 2022-08-14 LAB — URINE CULTURE: Culture: 70000 — AB

## 2022-08-15 LAB — CERVICOVAGINAL ANCILLARY ONLY
Bacterial Vaginitis (gardnerella): POSITIVE — AB
Candida Glabrata: NEGATIVE
Candida Vaginitis: POSITIVE — AB
Chlamydia: NEGATIVE
Comment: NEGATIVE
Comment: NEGATIVE
Comment: NEGATIVE
Comment: NEGATIVE
Comment: NEGATIVE
Comment: NORMAL
Neisseria Gonorrhea: NEGATIVE
Trichomonas: POSITIVE — AB

## 2022-08-16 ENCOUNTER — Telehealth: Payer: Self-pay | Admitting: Emergency Medicine

## 2022-08-16 MED ORDER — FLUCONAZOLE 150 MG PO TABS
150.0000 mg | ORAL_TABLET | Freq: Once | ORAL | 0 refills | Status: AC
Start: 2022-08-16 — End: 2022-08-16

## 2022-08-16 MED ORDER — NITROFURANTOIN MONOHYD MACRO 100 MG PO CAPS: 100.0000 mg | ORAL_CAPSULE | Freq: Two times a day (BID) | ORAL | 0 refills | Status: DC

## 2022-08-16 NOTE — Telephone Encounter (Signed)
Contacted pt by phone to discuss cytology and urine culture results. Treatment discussed. Pt verbalized understanding. Pt denies any questions.

## 2023-11-21 ENCOUNTER — Ambulatory Visit (HOSPITAL_COMMUNITY)
Admission: EM | Admit: 2023-11-21 | Discharge: 2023-11-21 | Disposition: A | Discharge status: Home / Self Care | Attending: Emergency Medicine | Admitting: Emergency Medicine

## 2023-11-21 ENCOUNTER — Encounter (HOSPITAL_COMMUNITY): Payer: Self-pay

## 2023-11-21 DIAGNOSIS — S46812A Strain of other muscles, fascia and tendons at shoulder and upper arm level, left arm, initial encounter: Secondary | ICD-10-CM

## 2023-11-21 MED ORDER — BACLOFEN 10 MG PO TABS: 10.0000 mg | ORAL_TABLET | Freq: Three times a day (TID) | ORAL | 0 refills | Status: AC

## 2023-11-21 MED ORDER — LIDOCAINE 5 % EX PTCH: 1.0000 | MEDICATED_PATCH | CUTANEOUS | 0 refills | Status: AC

## 2023-11-21 NOTE — Discharge Instructions (Signed)
 You can take baclofen  every 8 hours as needed for muscle pain and spasms. You can also apply lidocaine  patch for 12 hours at a time once daily for additional relief. Alternate between 500 mg of Tylenol  and 400 to 600 mg ibuprofen  every 6-8 hours as needed for pain. Alternate between ice and heat and do some gentle stretching to help with pain. You can follow-up with [place] [facility] if your pain continues for further evaluation and management. Otherwise follow-up with your primary care provider or return here as needed.

## 2023-11-21 NOTE — ED Triage Notes (Signed)
 Pt c/o center upper back pain radiating to lt shoulder since yesterday. Denies injury. Took motrin  yesterday with no relief.

## 2023-11-21 NOTE — ED Provider Notes (Signed)
 [facility]    CSN: [id number] Arrival date & time: 11/21/23  1223      History   Chief Complaint Chief Complaint  Patient presents with   Back Pain    HPI Alicia Castro is a 38 y.o. female.   Patient presents with left-sided upper back pain that occasionally radiates to her left shoulder that began yesterday.  Patient states that with certain movements she has a sharp shooting pain into her left upper back, but otherwise feels a constant tightness or soreness in her left upper back.    Denies shortness of breath or chest pain.  Patient denies any recent falls or known injuries.  Patient denies any previous history of pain to this area.  Patient also denies any history of surgeries to this area.  Patient states that she did take some Motrin  yesterday with minimal relief.  The history is provided by the patient and medical records.  Back Pain   Past Medical History:  Diagnosis Date   Abnormal Pap smear    Chlamydia    Eclampsia    Gonorrhea    Preterm labor    Trichomonas    UTI (lower urinary tract infection)    pt has h/o Acinetobacter in urine culture-requires Contact Isolation with each hodpitalization   Vaginal Pap smear, abnormal     Patient Active Problem List   Diagnosis Date Noted   Vaginal delivery 10/24/2016   Group B Streptococcus carrier, +RV culture, currently pregnant 10/16/2016   Preterm premature rupture of membranes (PPROM) with unknown onset of labor 10/15/2016   Anemia in pregnancy 10/12/2016   History of pre-eclampsia in prior pregnancy, currently pregnant 07/21/2016   History of preterm delivery, currently pregnant 07/21/2016   History of stillbirth in currently pregnant patient 07/21/2016   Supervision of high risk pregnancy, antepartum 07/14/2016   History of PRES (posterior reversible encephalopathy syndrome) 04/11/2013   Headache 04/08/2013    Past Surgical History:  Procedure Laterality Date   COLPOSCOPY     TUBAL  LIGATION N/A 10/22/2016   Procedure: POST PARTUM TUBAL LIGATION;  Surgeon: Fredirick Glenys RAMAN, MD;  Location: [facility];  Service: Gynecology;  Laterality: N/A;   WISDOM TOOTH EXTRACTION      OB History     Gravida  7   Para  5   Term  1   Preterm  4   AB  2   Living  4      SAB      IAB  2   Ectopic      Multiple  0   Live Births  4            Home Medications    Prior to Admission medications   Medication Sig Start Date End Date Taking? Authorizing Provider  baclofen  (LIORESAL ) 10 MG tablet Take 1 tablet (10 mg total) by mouth 3 (three) times daily. 11/21/23  Yes Jens Siems A, NP  lidocaine  (LIDODERM ) 5 % Place 1 patch onto the skin daily. Remove & Discard patch within 12 hours or as directed by MD 11/21/23  Yes Johnie Rumaldo LABOR, NP    Family History Family History  Problem Relation Age of Onset   Healthy Mother    Healthy Father    Cancer Maternal Grandmother     Social History Social History   Tobacco Use   Smoking status: Every Day    Current packs/day: 0.25    Types: Cigarettes   Smokeless tobacco: Never  Vaping Use   Vaping status: Never Used  Substance Use Topics   Alcohol use: Yes    Alcohol/week: 3.0 standard drinks of alcohol    Types: 3 Cans of beer per week    Comment: smoked yesterday, daily   Drug use: Yes    Frequency: 7.0 times per week    Types: Marijuana    Comment: ocassionally      Allergies   Patient has no known allergies.   Review of Systems Review of Systems  Musculoskeletal:  Positive for back pain.   Per HPI  Physical Exam Triage Vital Signs ED Triage Vitals  Encounter Vitals Group     BP 11/21/23 1403 111/75     Girls Systolic BP Percentile --      Girls Diastolic BP Percentile --      Boys Systolic BP Percentile --      Boys Diastolic BP Percentile --      Pulse Rate 11/21/23 1403 78     Resp 11/21/23 1403 18     Temp 11/21/23 1403 97.7 F (36.5 C)     Temp Source 11/21/23 1403 Oral     SpO2  11/21/23 1403 99 %     Weight --      Height --      Head Circumference --      Peak Flow --      Pain Score 11/21/23 1404 8     Pain Loc --      Pain Education --      Exclude from Growth Chart --    No data found.  Updated Vital Signs BP 111/75 (BP Location: Right Arm)   Pulse 78   Temp 97.7 F (36.5 C) (Oral)   Resp 18   LMP 10/22/2023 (Exact Date)   SpO2 99%   Breastfeeding No   Visual Acuity Right Eye Distance:   Left Eye Distance:   Bilateral Distance:    Right Eye Near:   Left Eye Near:    Bilateral Near:     Physical Exam Vitals and nursing note reviewed.  Constitutional:      General: She is awake. She is not in acute distress.    Appearance: Normal appearance. She is well-developed and well-groomed. She is not ill-appearing.  Musculoskeletal:     Cervical back: Normal.     Thoracic back: Spasms and tenderness present. No swelling, edema, deformity or bony tenderness. Normal range of motion.     Lumbar back: Normal.       Back:     Comments: Tenderness and spasms noted over the left trapezius muscle.  Skin:    General: Skin is warm and dry.  Neurological:     Mental Status: She is alert.  Psychiatric:        Behavior: Behavior is cooperative.      UC Treatments / Results  Labs (all labs ordered are listed, but only abnormal results are displayed) Labs Reviewed - No data to display  EKG   Radiology No results found.  Procedures Procedures (including critical care time)  Medications Ordered in UC Medications - No data to display  Initial Impression / Assessment and Plan / UC Course  I have reviewed the triage vital signs and the nursing notes.  Pertinent labs & imaging results that were available during my care of the patient were reviewed by me and considered in my medical decision making (see chart for details).     Patient is overall well-appearing.  Vitals are stable.  Symptoms likely muscular in nature.  Prescribed baclofen  as  needed for muscle pain and spasms.  Prescribed lidocaine  patches for additional relief.  Recommended Tylenol  and ibuprofen  as needed for pain.  Given orthopedic follow-up if needed.  Discussed follow-up and return precautions. Final Clinical Impressions(s) / UC Diagnoses   Final diagnoses:  Strain of left trapezius muscle, initial encounter     Discharge Instructions      You can take baclofen  every 8 hours as needed for muscle pain and spasms. You can also apply lidocaine  patch for 12 hours at a time once daily for additional relief. Alternate between 500 mg of Tylenol  and 400 to 600 mg ibuprofen  every 6-8 hours as needed for pain. Alternate between ice and heat and do some gentle stretching to help with pain. You can follow-up with [place] [facility] if your pain continues for further evaluation and management. Otherwise follow-up with your primary care provider or return here as needed.   ED Prescriptions     Medication Sig Dispense Auth. Provider   baclofen  (LIORESAL ) 10 MG tablet Take 1 tablet (10 mg total) by mouth 3 (three) times daily. 30 each Johnie Flaming A, NP   lidocaine  (LIDODERM ) 5 % Place 1 patch onto the skin daily. Remove & Discard patch within 12 hours or as directed by MD 30 patch Johnie Flaming LABOR, NP      PDMP not reviewed this encounter.   Johnie Flaming A, NP 11/21/23 [phone]
